# Patient Record
Sex: Female | Born: 1990 | Hispanic: No | Marital: Single | Smoking: Former smoker
Health system: Southern US, Community
[De-identification: ages and names within clinical notes are randomized; demographics above are authoritative.]

## PROBLEM LIST (undated history)

## (undated) ENCOUNTER — Inpatient Hospital Stay (HOSPITAL_COMMUNITY): Payer: Self-pay

## (undated) DIAGNOSIS — K219 Gastro-esophageal reflux disease without esophagitis: Secondary | ICD-10-CM

## (undated) DIAGNOSIS — I10 Essential (primary) hypertension: Secondary | ICD-10-CM

## (undated) DIAGNOSIS — A749 Chlamydial infection, unspecified: Secondary | ICD-10-CM

## (undated) DIAGNOSIS — O139 Gestational [pregnancy-induced] hypertension without significant proteinuria, unspecified trimester: Secondary | ICD-10-CM

## (undated) DIAGNOSIS — R51 Headache: Secondary | ICD-10-CM

## (undated) DIAGNOSIS — D6800 Von Willebrand disease, unspecified: Secondary | ICD-10-CM

## (undated) DIAGNOSIS — A549 Gonococcal infection, unspecified: Secondary | ICD-10-CM

## (undated) HISTORY — PX: DILATION AND CURETTAGE OF UTERUS: SHX78

## (undated) HISTORY — PX: WISDOM TOOTH EXTRACTION: SHX21

## (undated) HISTORY — PX: INDUCED ABORTION: SHX677

## (undated) HISTORY — DX: Gastro-esophageal reflux disease without esophagitis: K21.9

## (undated) HISTORY — PX: TONSILLECTOMY: SUR1361

## (undated) HISTORY — PX: CHOLECYSTECTOMY: SHX55

---
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
## 2011-07-26 DIAGNOSIS — N921 Excessive and frequent menstruation with irregular cycle: Secondary | ICD-10-CM | POA: Insufficient documentation

## 2011-07-28 ENCOUNTER — Inpatient Hospital Stay (HOSPITAL_COMMUNITY)
Admission: AD | Admit: 2011-07-28 | Discharge: 2011-07-28 | Disposition: A | Discharge status: Home / Self Care | Payer: Self-pay | Source: Ambulatory Visit | Attending: Obstetrics & Gynecology | Admitting: Obstetrics & Gynecology

## 2011-07-28 ENCOUNTER — Encounter (HOSPITAL_COMMUNITY): Payer: Self-pay | Admitting: *Deleted

## 2011-07-28 DIAGNOSIS — N938 Other specified abnormal uterine and vaginal bleeding: Secondary | ICD-10-CM | POA: Insufficient documentation

## 2011-07-28 DIAGNOSIS — N939 Abnormal uterine and vaginal bleeding, unspecified: Secondary | ICD-10-CM

## 2011-07-28 DIAGNOSIS — N898 Other specified noninflammatory disorders of vagina: Secondary | ICD-10-CM

## 2011-07-28 DIAGNOSIS — N949 Unspecified condition associated with female genital organs and menstrual cycle: Secondary | ICD-10-CM | POA: Insufficient documentation

## 2011-07-28 HISTORY — DX: Headache: R51

## 2011-07-28 HISTORY — DX: Gonococcal infection, unspecified: A54.9

## 2011-07-28 HISTORY — DX: Chlamydial infection, unspecified: A74.9

## 2011-07-28 LAB — CBC
Hemoglobin: 12.5 g/dL (ref 12.0–15.0)
MCH: 27.5 pg (ref 26.0–34.0)
MCHC: 34.2 g/dL (ref 30.0–36.0)
RDW: 12.6 % (ref 11.5–15.5)

## 2011-07-28 LAB — WET PREP, GENITAL

## 2011-07-28 MED ORDER — NORGESTIM-ETH ESTRAD TRIPHASIC 0.18/0.215/0.25 MG-25 MCG PO TABS: 1.0000 | ORAL_TABLET | Freq: Every day | ORAL | Status: DC

## 2011-07-28 NOTE — Progress Notes (Signed)
C/o "heavy bleeding" x2 wk.  Period started on time, but normally lasts 3-4 days.  Not using OC but is interested in receiving them.

## 2011-07-28 NOTE — Progress Notes (Signed)
PT SAYS   WAS SUPPOSE TO HAVE CYCLE  ON 06-06-2011- AS REG BUT IT CAME ON 6TH-.    SO NOV  3 RD SUPPOSE TO COME.   SO STARTED SPOTTING ON 07-17-2011-  MADE APPOINTMENT  WITH HD  ON 19TH TO FIND OUT IF PREG.   DID NOT GO.    HAD SEX ON 07-19-2011-  HAD LG AMT BLEEDING-  THEN THRU WEEK- HAD BLEEDING.  HAD BLOOD CLOTS  ON 11-24  AND STILL HAVING CLOTS-  FROM DIME- TO  HALF- DOLLAR SIZE.   HAS BACK PAIN X1 MTH.     NO BIRTH CONTROL.  HAS PAD ON NOW- IN TRIAGE- 2 RED STREAKS.

## 2011-07-28 NOTE — ED Provider Notes (Signed)
History     CSN: [id number] Arrival date & time: 07/28/2011  8:22 PM   None     Chief Complaint  Patient presents with  . Vaginal Bleeding    HPI Carly Rush is a 20 y.o. female who presents to [facility]U for vaginal bleeding. She states that in the past her periods have always been regular but October 8 th had period and then November 18 th had spotting. Started bleeding heavier on the 20 th. Has passed quarter size clots. Had appointment for pregnancy test on the 20 th but didn't go. Here tonight for pregnancy test and to see why bleeding. Last sexual intercourse 07/19/11. Never had a pap smear. TAB x 1. No birth control.  Past Medical History  Diagnosis Date  . Asthma   . Headache   . Gonorrhea   . Chlamydia     Past Surgical History  Procedure Date  . Tonsillectomy   . Induced abortion     History reviewed. No pertinent family history.  History  Substance Use Topics  . Smoking status: Current Some Day Smoker  . Smokeless tobacco: Never Used  . Alcohol Use: No     3-4 drinks occasionally    OB History    Grav Para Term Preterm Abortions TAB SAB Ect Mult Living   1    1 1     0      Review of Systems  Constitutional: Negative for fever, chills, diaphoresis and fatigue.  HENT: Negative for ear pain, congestion, sore throat, facial swelling, neck pain, neck stiffness, dental problem and sinus pressure.   Eyes: Negative for photophobia, pain and discharge.  Respiratory: Negative for cough, chest tightness and wheezing.   Gastrointestinal: Negative for nausea, vomiting, abdominal pain, diarrhea, constipation and abdominal distention.  Genitourinary: Positive for vaginal bleeding and vaginal discharge. Negative for dysuria, frequency, flank pain and difficulty urinating.  Musculoskeletal: Negative for myalgias, back pain and gait problem.  Skin: Negative for color change and rash.  Neurological: Negative for dizziness, speech difficulty, weakness, light-headedness, numbness  and headaches.  Psychiatric/Behavioral: Negative for confusion and agitation.    Allergies  Review of patient's allergies indicates no known allergies.  Home Medications  No current outpatient prescriptions on file.  BP 141/79  Pulse 97  Temp(Src) 98.8 F (37.1 C) (Oral)  Resp 20  Ht 5\' 3"  (1.6 m)  Wt 233 lb 2 oz (105.745 kg)  BMI 41.30 kg/m2  LMP 07/17/2011  Physical Exam  Nursing note and vitals reviewed. Constitutional: She is oriented to person, place, and time. She appears well-developed and well-nourished. No distress.  HENT:  Head: Normocephalic and atraumatic.  Eyes: EOM are normal.  Neck: Neck supple.  Cardiovascular: Normal rate.   Pulmonary/Chest: Effort normal.  Abdominal: Soft. There is no tenderness.  Genitourinary:       External genitalia without lesions. Moderate blood vaginal vault. No CMT, no adnexal tenderness or mass palpated. Uterus without palpable enlargement.  Musculoskeletal: Normal range of motion.  Neurological: She is alert and oriented to person, place, and time. No cranial nerve deficit.  Skin: Skin is warm and dry.  Psychiatric: Her behavior is normal. Judgment and thought content normal.   Results for orders placed during the hospital encounter of 07/28/11 (from the past 24 hour(s))  POCT PREGNANCY, URINE     Status: Normal   Collection Time   07/28/11  9:55 PM      Component Value Range   Preg Test, Ur NEGATIVE  WET PREP, GENITAL     Status: Abnormal   Collection Time   07/28/11 10:10 PM      Component Value Range   Yeast, Wet Prep NONE SEEN  NONE SEEN    Trich, Wet Prep NONE SEEN  NONE SEEN    Clue Cells, Wet Prep FEW (*) NONE SEEN    WBC, Wet Prep HPF POC FEW (*) NONE SEEN   CBC     Status: Abnormal   Collection Time   07/28/11 10:20 PM      Component Value Range   WBC 11.6 (*) 4.0 - 10.5 (K/uL)   RBC 4.54  3.87 - 5.11 (MIL/uL)   Hemoglobin 12.5  12.0 - 15.0 (g/dL)   HCT 64.3  32.9 - 51.8 (%)   MCV 80.4  78.0 - 100.0  (fL)   MCH 27.5  26.0 - 34.0 (pg)   MCHC 34.2  30.0 - 36.0 (g/dL)   RDW 84.1  66.0 - 63.0 (%)   Platelets 264  150 - 400 (K/uL)   Assessment: Abnormal vaginal bleeding  Plan:  Discussed results of lab with patient   Discussed in detail abnormal vaginal bleeding causes and need for follow up   OC's to regulate bleeding until follow up with [facility]  ED Course  Procedures          [place], NP 07/29/11 [phone]

## 2011-07-29 LAB — GC/CHLAMYDIA PROBE AMP, GENITAL: GC Probe Amp, Genital: NEGATIVE

## 2011-07-29 NOTE — ED Provider Notes (Signed)
Agree with above note.  Don Tiu H. 07/29/2011 6:52 AM

## 2011-10-17 ENCOUNTER — Inpatient Hospital Stay (HOSPITAL_COMMUNITY)
Admission: AD | Admit: 2011-10-17 | Discharge: 2011-10-17 | Disposition: A | Discharge status: Home / Self Care | Payer: Self-pay | Source: Ambulatory Visit | Attending: Family Medicine | Admitting: Family Medicine

## 2011-10-17 ENCOUNTER — Inpatient Hospital Stay (HOSPITAL_COMMUNITY): Payer: Self-pay

## 2011-10-17 ENCOUNTER — Encounter (HOSPITAL_COMMUNITY): Payer: Self-pay

## 2011-10-17 DIAGNOSIS — N949 Unspecified condition associated with female genital organs and menstrual cycle: Secondary | ICD-10-CM

## 2011-10-17 DIAGNOSIS — A562 Chlamydial infection of genitourinary tract, unspecified: Secondary | ICD-10-CM | POA: Insufficient documentation

## 2011-10-17 DIAGNOSIS — N920 Excessive and frequent menstruation with regular cycle: Secondary | ICD-10-CM | POA: Insufficient documentation

## 2011-10-17 LAB — CBC
Hemoglobin: 7.5 g/dL — ABNORMAL LOW (ref 12.0–15.0)
MCV: 80.5 fL (ref 78.0–100.0)
Platelets: 336 10*3/uL (ref 150–400)
RBC: 2.92 MIL/uL — ABNORMAL LOW (ref 3.87–5.11)
WBC: 9.3 10*3/uL (ref 4.0–10.5)

## 2011-10-17 LAB — WET PREP, GENITAL
Clue Cells Wet Prep HPF POC: NONE SEEN
WBC, Wet Prep HPF POC: NONE SEEN
Yeast Wet Prep HPF POC: NONE SEEN

## 2011-10-17 MED ORDER — MEDROXYPROGESTERONE ACETATE 10 MG PO TABS: 10.0000 mg | ORAL_TABLET | Freq: Two times a day (BID) | ORAL | Status: DC

## 2011-10-17 MED ORDER — INTEGRA F 125-1 MG PO CAPS: 1.0000 | ORAL_CAPSULE | Freq: Every day | ORAL | Status: DC

## 2011-10-17 NOTE — Discharge Instructions (Signed)
Menorrhagia Dysfunctional uterine bleeding is different from a normal menstrual period. When periods are heavy or there is more bleeding than is usual for you, it is called menorrhagia. It may be caused by hormonal imbalance, or physical, metabolic, or other problems. Examination is necessary in order that your caregiver may treat treatable causes. If this is a continuing problem, a D&C may be needed. That means that the cervix (the opening of the uterus or womb) is dilated (stretched larger) and the lining of the uterus is scraped out. The tissue scraped out is then examined under a microscope by a specialist (pathologist) to make sure there is nothing of concern that needs further or more extensive treatment. HOME CARE INSTRUCTIONS   If medications were prescribed, take exactly as directed. Do not change or switch medications without consulting your caregiver.   Long term heavy bleeding may result in iron deficiency. Your caregiver may have prescribed iron pills. They help replace the iron your body lost from heavy bleeding. Take exactly as directed. Iron may cause constipation. If this becomes a problem, increase the bran, fruits, and roughage in your diet.   Do not take aspirin or medicines that contain aspirin one week before or during your menstrual period. Aspirin may make the bleeding worse.   If you need to change your sanitary pad or tampon more than once every 2 hours, stay in bed and rest as much as possible until the bleeding stops.   Eat well-balanced meals. Eat foods high in iron. Examples are leafy green vegetables, meat, liver, eggs, and whole grain breads and cereals. Do not try to lose weight until the abnormal bleeding has stopped and your blood iron level is back to normal.  SEEK MEDICAL CARE IF:   You need to change your sanitary pad or tampon more than once an hour.   You develop nausea (feeling sick to your stomach) and vomiting, dizziness, or diarrhea while you are taking  your medicine.   You have any problems that may be related to the medicine you are taking.  SEEK IMMEDIATE MEDICAL CARE IF:   You have a fever.   You develop chills.   You develop severe bleeding or start to pass blood clots.   You feel dizzy or faint.  MAKE SURE YOU:   Understand these instructions.   Will watch your condition.   Will get help right away if you are not doing well or get worse.  Document Released: 08/15/2005 Document Revised: 04/27/2011 Document Reviewed: 04/04/2008 [facility] Patient Information 2012 [place], [place].  Von Willebrand Disease, FAQs IS THERE A CURE FOR VON WILLEBRAND DISEASE? No. Von Willebrand disease is a lifelong disorder. Most people have a mild form of the disorder that causes little or no change in their lives. WHO SHOULD KNOW THAT I HAVE VON WILLEBRAND DISEASE?  Adults should be sure that persons such as their doctor, dentist, employee health nurse, gym trainer, and sports coach are aware of their disorder. You should wear a medical ID bracelet stating that you have von Willebrand disease. In case of an accident or emergency, this will be very helpful to the caregivers treating you.  WHO SHOULD KNOW THAT MY CHILD HAS VON WILLEBRAND DISEASE?  It is important that anyone who will be responsible for your child know about his or her disorder. For example, the school nurse, teacher, daycare provider, coach, or any leader of any after-school activity should know that your son or daughter has von Willebrand disease.  WHY IS  THIS CONDITION CALLED VON WILLEBRAND DISEASE? A man named Orson Ape Willebrand discovered the disease in 1925. The disease was named after him.  IS VON WILLEBRAND DISEASE LIFE-THREATENING?  Most people with von Willebrand disease have a mild form that usually does not cause bleeding that is life threatening. Any bleeding that cannot be controlled can be life threatening. People with severe forms of von Willebrand disease will rarely  need to seek immediate emergency treatment to stop life threatening bleeding. DO ALL PEOPLE WHO HAVE THE GENE FOR VON WILLEBRAND DISEASE HAVE BLEEDING PROBLEMS? No. Many people carry the genes for the disease but do not have bleeding symptoms. Even though they do not have symptoms, they can still pass the disease on to their children.  HOW WILL I KNOW IF I HAVE VON WILLEBRAND DISEASE?  If you or anyone in your family has a history of bleeding, your doctor will use a number of blood tests to find out whether or not you have the disease. These tests will check how quickly your blood clots and the levels of clotting factors in your blood. Your caregiver will use these test results to diagnose your bleeding disorder. Document Released: 04/12/2005 Document Revised: 04/27/2011 Document Reviewed: 11/23/2005 [facility] Patient Information 2012 [place], [place].

## 2011-10-17 NOTE — ED Provider Notes (Signed)
History     Chief Complaint  Patient presents with  . Vaginal Bleeding   HPI  Pt here with report of irregular bleeding that started in November 2012.  Pt bled in November for 3-4 weeks.  No bleeding x 1.5 wks and bleeding returned.  Uses 1.5 pack of pads every 3-4 days.  Reports frequent headaches and fatigue.  Denies chest pain, occasional shortness of breath.  +clots, quarter-size.    Pt has Von Willebrand disease, diagnosed during childhood.  Past Medical History  Diagnosis Date  . Asthma   . Headache   . Gonorrhea 2011  . Chlamydia 2011    Past Surgical History  Procedure Date  . Tonsillectomy   . Induced abortion     Family History  Problem Relation Age of Onset  . Anesthesia problems Neg Hx     History  Substance Use Topics  . Smoking status: Current Some Day Smoker  . Smokeless tobacco: Never Used  . Alcohol Use: No     3-4 drinks occasionally    Allergies: No Known Allergies  Prescriptions prior to admission  Medication Sig Dispense Refill  . ibuprofen (ADVIL,MOTRIN) 200 MG tablet Take 200 mg by mouth every 6 (six) hours as needed. For pain         Review of Systems  Constitutional: Positive for malaise/fatigue.  Eyes: Negative.   Respiratory: Positive for shortness of breath.   Cardiovascular: Negative.   Gastrointestinal: Negative.   Neurological: Positive for dizziness ("occasional") and headaches.   Physical Exam   Blood pressure 121/70, pulse 99, temperature 98.1 F (36.7 C), temperature source Oral, resp. rate 20, height 5\' 4"  (1.626 m), weight 100.245 kg (221 lb), last menstrual period 09/13/2011, SpO2 100.00%.  Physical Exam  Constitutional: She is oriented to person, place, and time. She appears well-developed and well-nourished.  HENT:  Head: Normocephalic.  Neck: Normal range of motion. Neck supple.  Cardiovascular: Normal rate, regular rhythm and normal heart sounds.   Respiratory: Effort normal and breath sounds normal.  GI:  Soft. She exhibits no mass. There is tenderness. There is no guarding.  Genitourinary: There is bleeding (negative clots, moderate) around the vagina.       Uterus difficult to palpate secondary to weight  Neurological: She is alert and oriented to person, place, and time. She has normal reflexes.  Skin: Skin is warm and dry.   Results for orders placed during the hospital encounter of 10/17/11 (from the past 24 hour(s))  POCT PREGNANCY, URINE     Status: Normal   Collection Time   10/17/11  5:37 PM      Component Value Range   Preg Test, Ur NEGATIVE  NEGATIVE   CBC     Status: Abnormal   Collection Time   10/17/11  5:50 PM      Component Value Range   WBC 9.3  4.0 - 10.5 (K/uL)   RBC 2.92 (*) 3.87 - 5.11 (MIL/uL)   Hemoglobin 7.5 (*) 12.0 - 15.0 (g/dL)   HCT 09.8 (*) 11.9 - 46.0 (%)   MCV 80.5  78.0 - 100.0 (fL)   MCH 25.7 (*) 26.0 - 34.0 (pg)   MCHC 31.9  30.0 - 36.0 (g/dL)   RDW 14.7  82.9 - 56.2 (%)   Platelets 336  150 - 400 (K/uL)  WET PREP, GENITAL     Status: Normal   Collection Time   10/17/11  6:51 PM      Component Value Range  Yeast Wet Prep HPF POC NONE SEEN  NONE SEEN    Trich, Wet Prep NONE SEEN  NONE SEEN    Clue Cells Wet Prep HPF POC NONE SEEN  NONE SEEN    WBC, Wet Prep HPF POC NONE SEEN  NONE SEEN    Imaging:  US Transvaginal Non-ob  10/17/2011  *RADIOLOGY REPORT*  Clinical Data: Dysfunctional uterine bleeding.  TRANSABDOMINAL AND TRANSVAGINAL ULTRASOUND OF PELVIS Technique:  Both transabdominal and transvaginal ultrasound examinations of the pelvis were performed. Transabdominal technique was performed for global imaging of the pelvis including uterus, ovaries, adnexal regions, and pelvic cul-de-sac.  Comparison: None.   It was necessary to proceed with endovaginal exam following the transabdominal exam to visualize the uterus, endometrium and ovaries and better detail.  Findings:  Uterus: Normal in size and appearance  Endometrium: Normal in thickness and  appearance, measuring 11.0 mm in maximum thickness.  No visible mass.  Right ovary:  Normal appearance/no adnexal mass  Left ovary: Normal appearance/no adnexal mass  Other findings: No free fluid  IMPRESSION: Normal study. No evidence of pelvic mass or other significant abnormality.  Original Report Authenticated By: Darrol Angel, M.D.   US Pelvis Complete  10/17/2011  *RADIOLOGY REPORT*  Clinical Data: Dysfunctional uterine bleeding.  TRANSABDOMINAL AND TRANSVAGINAL ULTRASOUND OF PELVIS Technique:  Both transabdominal and transvaginal ultrasound examinations of the pelvis were performed. Transabdominal technique was performed for global imaging of the pelvis including uterus, ovaries, adnexal regions, and pelvic cul-de-sac.  Comparison: None.   It was necessary to proceed with endovaginal exam following the transabdominal exam to visualize the uterus, endometrium and ovaries and better detail.  Findings:  Uterus: Normal in size and appearance  Endometrium: Normal in thickness and appearance, measuring 11.0 mm in maximum thickness.  No visible mass.  Right ovary:  Normal appearance/no adnexal mass  Left ovary: Normal appearance/no adnexal mass  Other findings: No free fluid  IMPRESSION: Normal study. No evidence of pelvic mass or other significant abnormality.  Original Report Authenticated By: Darrol Angel, M.D.   MAU Course  Procedures Pelvic exam with wet prep, GC/Chlamydia U/S  Discussed results with Dr Shawnie Pons.  Plan to d/c home with iron supplement, Provera for 10 days, and f/u in Gyn clinic.    Assessment and Plan  Report given to L. L-Kirby, who assumes care of patient.   [facility] 10/17/2011, 6:17 PM   A: Menorrhagia  P: D/C home Provera 10 mg BID until bleeding stops, then 1x day for total of 10 days Integra iron supplement daily F/u in Gyn clinic  Sharen Counter, CNM

## 2011-10-17 NOTE — Progress Notes (Signed)
Nov, period was late - when started bleeding- bled for 3 wks, heavy with clots, was here end of Nov, Dx dysfunctional bleeding, stopped bleeding for a few days then restarted end of Jan, bleeding since.  Daily headaches and sometimes feels dizzy.  Gets sort of breath easy.

## 2011-10-18 LAB — GC/CHLAMYDIA PROBE AMP, GENITAL: GC Probe Amp, Genital: NEGATIVE

## 2011-10-18 NOTE — ED Provider Notes (Signed)
Chart reviewed and agree with management and plan.  

## 2011-11-02 ENCOUNTER — Ambulatory Visit (INDEPENDENT_AMBULATORY_CARE_PROVIDER_SITE_OTHER): Payer: Self-pay | Admitting: Advanced Practice Midwife

## 2011-11-02 ENCOUNTER — Encounter: Payer: Self-pay | Admitting: Advanced Practice Midwife

## 2011-11-02 DIAGNOSIS — R350 Frequency of micturition: Secondary | ICD-10-CM

## 2011-11-02 DIAGNOSIS — D6800 Von Willebrand disease, unspecified: Secondary | ICD-10-CM

## 2011-11-02 DIAGNOSIS — A749 Chlamydial infection, unspecified: Secondary | ICD-10-CM

## 2011-11-02 DIAGNOSIS — A64 Unspecified sexually transmitted disease: Secondary | ICD-10-CM

## 2011-11-02 DIAGNOSIS — N921 Excessive and frequent menstruation with irregular cycle: Secondary | ICD-10-CM

## 2011-11-02 DIAGNOSIS — N920 Excessive and frequent menstruation with regular cycle: Secondary | ICD-10-CM

## 2011-11-02 DIAGNOSIS — A562 Chlamydial infection of genitourinary tract, unspecified: Secondary | ICD-10-CM

## 2011-11-02 LAB — CBC
HCT: 34.2 % — ABNORMAL LOW (ref 36.0–46.0)
Hemoglobin: 10.4 g/dL — ABNORMAL LOW (ref 12.0–15.0)
MCH: 25.4 pg — ABNORMAL LOW (ref 26.0–34.0)
MCHC: 30.4 g/dL (ref 30.0–36.0)

## 2011-11-02 MED ORDER — NORGESTIMATE-ETH ESTRADIOL 0.25-35 MG-MCG PO TABS: 1.0000 | ORAL_TABLET | Freq: Every day | ORAL | Status: DC

## 2011-11-02 MED ORDER — INTEGRA F 125-1 MG PO CAPS: 1.0000 | ORAL_CAPSULE | Freq: Every day | ORAL | Status: DC

## 2011-11-02 MED ORDER — MEDROXYPROGESTERONE ACETATE 10 MG PO TABS: 10.0000 mg | ORAL_TABLET | Freq: Two times a day (BID) | ORAL | Status: DC

## 2011-11-02 NOTE — Progress Notes (Signed)
  Subjective:    Patient ID: Carly Rush, female    DOB: 1991-04-25, 21 y.o.   MRN: [id number]  HPI Here for F/U visit from MAU for menometrorrhagia w/ Von Willebrand's. She took Provera w/ good results, but bleeding started back on 10/29/11 and has been heavy again although not as much as last episode. She has been taking Integra F. She was Dx w/ Chlamydia at last MAU visit and Tx . Partner also Tx. TOC today. Reports frequency.  Review of Systems  Constitutional: Negative for fatigue.  Genitourinary: Positive for frequency and vaginal bleeding. Negative for dysuria, flank pain and pelvic pain.  Skin: Negative for pallor.  Neurological: Negative for dizziness and syncope.  Hematological: Does not bruise/bleed easily.       Objective:   Physical Exam  Constitutional: She is oriented to person, place, and time. She appears well-developed and well-nourished. No distress.  Cardiovascular: Normal rate.   Pulmonary/Chest: Effort normal.  Abdominal: Soft. There is no tenderness.  Genitourinary:       Deferred  Neurological: She is alert and oriented to person, place, and time.  Skin: Skin is warm and dry. No pallor.  Psychiatric: She has a normal mood and affect.      Assessment & Plan:    1. STD (female)  GC/chlamydia probe amp, urine  2. Menometrorrhagia  CBC, Fe Fum-FePoly-FA-Vit C-Vit B3 (INTEGRA F) 125-1 MG CAPS, medroxyPROGESTERone (PROVERA) 10 MG tablet, norgestimate-ethinyl estradiol (ORTHO-CYCLEN,SPRINTEC,PREVIFEM) 0.25-35 MG-MCG tablet  3. Frequency of urination  UA, Urine Culture   Pt prefers to do same regimen of Provera as last MAU visit (10 mg BID). Instructed to immediately start OCPs next day Bleeding precautions  Dorathy Kinsman 11/02/2011 2:46 PM

## 2011-11-02 NOTE — Patient Instructions (Signed)
Menorrhagia Dysfunctional uterine bleeding is different from a normal menstrual period. When periods are heavy or there is more bleeding than is usual for you, it is called menorrhagia. It may be caused by hormonal imbalance, or physical, metabolic, or other problems. Examination is necessary in order that your caregiver may treat treatable causes. If this is a continuing problem, a D&C may be needed. That means that the cervix (the opening of the uterus or womb) is dilated (stretched larger) and the lining of the uterus is scraped out. The tissue scraped out is then examined under a microscope by a specialist (pathologist) to make sure there is nothing of concern that needs further or more extensive treatment. HOME CARE INSTRUCTIONS   If medications were prescribed, take exactly as directed. Do not change or switch medications without consulting your caregiver.   Long term heavy bleeding may result in iron deficiency. Your caregiver may have prescribed iron pills. They help replace the iron your body lost from heavy bleeding. Take exactly as directed. Iron may cause constipation. If this becomes a problem, increase the bran, fruits, and roughage in your diet.   Do not take aspirin or medicines that contain aspirin one week before or during your menstrual period. Aspirin may make the bleeding worse.   If you need to change your sanitary pad or tampon more than once every 2 hours, stay in bed and rest as much as possible until the bleeding stops.   Eat well-balanced meals. Eat foods high in iron. Examples are leafy green vegetables, meat, liver, eggs, and whole grain breads and cereals. Do not try to lose weight until the abnormal bleeding has stopped and your blood iron level is back to normal.  SEEK MEDICAL CARE IF:   You need to change your sanitary pad or tampon more than once an hour.   You develop nausea (feeling sick to your stomach) and vomiting, dizziness, or diarrhea while you are taking  your medicine.   You have any problems that may be related to the medicine you are taking.  SEEK IMMEDIATE MEDICAL CARE IF:   You have a fever.   You develop chills.   You develop severe bleeding or start to pass blood clots.   You feel dizzy or faint.  MAKE SURE YOU:   Understand these instructions.   Will watch your condition.   Will get help right away if you are not doing well or get worse.  Document Released: 08/15/2005 Document Revised: 08/04/2011 Document Reviewed: 04/04/2008 [facility] Patient Information 2012 [place], [place].Von Willebrand Disease, FAQs IS THERE A CURE FOR VON WILLEBRAND DISEASE? No. Von Willebrand disease is a lifelong disorder. Most people have a mild form of the disorder that causes little or no change in their lives. WHO SHOULD KNOW THAT I HAVE VON WILLEBRAND DISEASE?  Adults should be sure that persons such as their doctor, dentist, employee health nurse, gym trainer, and sports coach are aware of their disorder. You should wear a medical ID bracelet stating that you have von Willebrand disease. In case of an accident or emergency, this will be very helpful to the caregivers treating you.  WHO SHOULD KNOW THAT MY CHILD HAS VON WILLEBRAND DISEASE?  It is important that anyone who will be responsible for your child know about his or her disorder. For example, the school nurse, teacher, daycare provider, coach, or any leader of any after-school activity should know that your son or daughter has von Willebrand disease.  WHY IS THIS CONDITION  CALLED VON WILLEBRAND DISEASE? A man named Orson Ape Willebrand discovered the disease in 1925. The disease was named after him.  IS VON WILLEBRAND DISEASE LIFE-THREATENING?  Most people with von Willebrand disease have a mild form that usually does not cause bleeding that is life threatening. Any bleeding that cannot be controlled can be life threatening. People with severe forms of von Willebrand disease will rarely need to  seek immediate emergency treatment to stop life threatening bleeding. DO ALL PEOPLE WHO HAVE THE GENE FOR VON WILLEBRAND DISEASE HAVE BLEEDING PROBLEMS? No. Many people carry the genes for the disease but do not have bleeding symptoms. Even though they do not have symptoms, they can still pass the disease on to their children.  HOW WILL I KNOW IF I HAVE VON WILLEBRAND DISEASE?  If you or anyone in your family has a history of bleeding, your doctor will use a number of blood tests to find out whether or not you have the disease. These tests will check how quickly your blood clots and the levels of clotting factors in your blood. Your caregiver will use these test results to diagnose your bleeding disorder. Document Released: 04/12/2005 Document Revised: 08/04/2011 Document Reviewed: 11/23/2005 [facility] Patient Information 2012 [place], [place].

## 2011-11-03 LAB — GC/CHLAMYDIA PROBE AMP, URINE: Chlamydia, Swab/Urine, PCR: NEGATIVE

## 2011-11-15 ENCOUNTER — Telehealth: Payer: Self-pay | Admitting: *Deleted

## 2011-11-15 NOTE — Telephone Encounter (Signed)
Pt left message stating that she would like to speak to a nurse.  

## 2011-11-15 NOTE — Telephone Encounter (Signed)
Returned pt call and discussed her concern. She stated that she took the 5 days of provera after her last visit and even though her bleeding had not stopped, she began her birth control pills on 11/07/11. She is still bleeding- using 1 pad every 5 hrs and wants to know if this is ok. I reviewed the provera order and told pt that I thought she was supposed to take the medication for 10 days prior to the start of birth control. Pt states that she did not understand that. I informed her that while the bleeding may be annoying or disruptive, it is not heavy enough to cause her immediate problems. I advised pt to continue taking the birth control pills as prescribed.  I will talk with provider and call her back with additional advice/information. Pt voiced understanding.

## 2011-11-21 NOTE — Telephone Encounter (Signed)
Her appt is Thursday April 11 at 2:15pm with Dorathy Kinsman.

## 2011-11-21 NOTE — Telephone Encounter (Signed)
Called pt and informed her of next appt on 12/08/11 @ 1415. Pt voiced understanding.

## 2011-11-21 NOTE — Telephone Encounter (Signed)
Spoke w/pt today after consult w/Dr. Penne Lash. I asked if her bleeding is the same. She stated that it has tapered down but she is still spotting. I informed her that she should take 1 OCP twice daily for the next week, then resume 1 pill daily if her bleeding has stopped. She should not take the last week of pills for this pack or the next pack. We will make her an appt for follow up next month and I will call her back with appt details. Pt stated that she has noticed some changes in her mood and wanted to know if she could get a lower dose pill. I advised her that we would like to get her bleeding controlled first, then she may be able to switch to a different pill. This can de discussed @ her follow up appt. Pt voiced understanding.

## 2011-12-08 ENCOUNTER — Ambulatory Visit: Payer: Self-pay | Admitting: Advanced Practice Midwife

## 2012-08-29 NOTE — L&D Delivery Note (Signed)
Delivery Note At 3:33 PM a viable female was delivered via  (Presentation: LOA ;  ).  APGAR: 9 ,9 ; weight .   Placenta status: , .  Cord:  with the following complications:   Anesthesia:  None Episiotomy: None Lacerations:  None Suture Repair: NA Est. Blood Loss (mL): 300  Mom to postpartum.  Baby to nursery-stable.  NSVD over intact perineum of viable female. Active management of 3rd stage of labor and prophylactic cytotec PR at time of delivery of placenta. Intact 3V. Will give DDAVP 0.3 mcg/kg x1 IV and then 2 sprays nasal q12 hrs for 6 doses. EBL 300. Hemostatic when i left the room.  Tawana Scale 05/29/2013, 3:52 PM

## 2012-08-29 NOTE — L&D Delivery Note (Signed)
Attestation of Attending Supervision of Fellow: Evaluation and management procedures were performed by the Fellow under my supervision and collaboration.  I have reviewed the Fellow's note and chart, and I agree with the management and plan.    

## 2012-10-01 ENCOUNTER — Inpatient Hospital Stay (HOSPITAL_COMMUNITY)
Admission: AD | Admit: 2012-10-01 | Discharge: 2012-10-01 | Disposition: A | Discharge status: Home / Self Care | Payer: Medicaid Other | Source: Ambulatory Visit | Attending: Obstetrics & Gynecology | Admitting: Obstetrics & Gynecology

## 2012-10-01 ENCOUNTER — Encounter (HOSPITAL_COMMUNITY): Payer: Self-pay

## 2012-10-01 DIAGNOSIS — A6000 Herpesviral infection of urogenital system, unspecified: Secondary | ICD-10-CM

## 2012-10-01 DIAGNOSIS — D6800 Von Willebrand disease, unspecified: Secondary | ICD-10-CM

## 2012-10-01 DIAGNOSIS — Z3201 Encounter for pregnancy test, result positive: Secondary | ICD-10-CM

## 2012-10-01 DIAGNOSIS — Z3490 Encounter for supervision of normal pregnancy, unspecified, unspecified trimester: Secondary | ICD-10-CM

## 2012-10-01 NOTE — MAU Provider Note (Signed)
Attestation of Attending Supervision of Advanced Practitioner (PA/CNM/NP): Evaluation and management procedures were performed by the Advanced Practitioner under my supervision and collaboration.  I have reviewed the Advanced Practitioner's note and chart, and I agree with the management and plan.  Jaylianna Tatlock, MD, FACOG Attending Obstetrician & Gynecologist Faculty Practice, [facility]  

## 2012-10-01 NOTE — MAU Note (Signed)
Patient states she has had 3 positive home pregnancy tests. Wants confirmation. Denies pain or bleeding.

## 2012-10-01 NOTE — MAU Provider Note (Signed)
CSN: [id number]  Arrival date and time: 10/01/12 1411   None     Chief Complaint  Patient presents with  . Possible Pregnancy   HPI Carly Rush is a 22 y.o. female @ [redacted] weeks gestation.  Patient's last menstrual period was 09/02/2012.  She had a positive home pregnancy test and wants to get into prenatal care as soon as possible because she has [employer] and was told she could have problems during pregnancy. 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 She denies any problems at this time. The history was provided by the patient.  OB History    Grav Para Term Preterm Abortions TAB SAB Ect Mult Living   1 0 0 0 1 1 0 0 0 0       Past Medical History  Diagnosis Date  . Asthma   . Headache   . Gonorrhea 2011  . Chlamydia 2011    Past Surgical History  Procedure Date  . Tonsillectomy   . Induced abortion     Family History  Problem Relation Age of Onset  . Anesthesia problems Neg Hx     History  Substance Use Topics  . Smoking status: Current Some Day Smoker  . Smokeless tobacco: Never Used  . Alcohol Use: No     Comment: 3-4 drinks occasionally    Allergies: No Known Allergies  Prescriptions prior to admission  Medication Sig Dispense Refill  . Fe Fum-FePoly-FA-Vit C-Vit B3 (INTEGRA F) 125-1 MG CAPS Take 1 capsule by mouth daily.  30 capsule  12  . ibuprofen (ADVIL,MOTRIN) 200 MG tablet Take 200 mg by mouth every 6 (six) hours as needed. For pain       . medroxyPROGESTERone (PROVERA) 10 MG tablet Take 1 tablet (10 mg total) by mouth 2 (two) times daily.  10 tablet  1  . norgestimate-ethinyl estradiol (ORTHO-CYCLEN,SPRINTEC,PREVIFEM) 0.25-35 MG-MCG tablet Take 1 tablet by mouth daily.  1 Package  12    Review of Systems  Genitourinary:       Positive pregnancy test, amenorrhea  All other systems reviewed and are negative.   Blood pressure 128/65, pulse 88, temperature 97.9 F (36.6 C),  temperature source Oral, resp. rate 18, height 5\' 4"  (1.626 m), weight 228 lb 12.8 oz (103.783 kg), last menstrual period 09/02/2012, SpO2 100.00%.  Physical Exam  Constitutional: She is oriented to person, place, and time. She appears well-developed and well-nourished. No distress.  Eyes: EOM are normal.  Neck: Neck supple.  Cardiovascular: Normal rate.   Respiratory: Effort normal. No respiratory distress.  Neurological: She is alert and oriented to person, place, and time.  Psychiatric: She has a normal mood and affect. Her behavior is normal. Judgment and thought content normal.   Procedures Results for orders placed during the hospital encounter of 10/01/12 (from the past 24 hour(s))  POCT PREGNANCY, URINE     Status: Abnormal   Collection Time   10/01/12  3:03 PM      Component Value Range   Preg Test, Ur POSITIVE (*) NEGATIVE    Assessment: 22 y.o. female with positive pregnancy test   [employer]   Plan:  Appointment with [facility] 11/15/12 @ 9:30 am   Pregnancy verification letter   Start prenatal vitamins, return as needed for problems.   Truong Delcastillo, RN, FNP, [facility] 10/01/2012, 3:31 PM   15:40 discussed with Dr. Macon Large and she agrees with assessment and plan  of care.

## 2012-10-05 ENCOUNTER — Inpatient Hospital Stay (HOSPITAL_COMMUNITY)
Admission: AD | Admit: 2012-10-05 | Discharge: 2012-10-05 | Disposition: A | Discharge status: Home / Self Care | Payer: Medicaid Other | Source: Ambulatory Visit | Attending: Obstetrics & Gynecology | Admitting: Obstetrics & Gynecology

## 2012-10-05 ENCOUNTER — Encounter (HOSPITAL_COMMUNITY): Payer: Self-pay | Admitting: *Deleted

## 2012-10-05 DIAGNOSIS — J069 Acute upper respiratory infection, unspecified: Secondary | ICD-10-CM

## 2012-10-05 DIAGNOSIS — O99891 Other specified diseases and conditions complicating pregnancy: Secondary | ICD-10-CM | POA: Insufficient documentation

## 2012-10-05 HISTORY — DX: Von Willebrand's disease: D68.0

## 2012-10-05 HISTORY — DX: Von Willebrand disease, unspecified: D68.00

## 2012-10-05 LAB — INFLUENZA PANEL BY PCR (TYPE A & B)
Influenza A By PCR: NEGATIVE
Influenza B By PCR: NEGATIVE

## 2012-10-05 NOTE — MAU Note (Addendum)
Been sick for like a week.  Runny nose, cough, sore throat when wakes up, denies at this time.  Boyfriend says her body  gets really hot at night- has not actually checked temperature(denies chills or diaphoresis) . Sometimes cough is productive- clear mucous

## 2012-10-05 NOTE — MAU Provider Note (Signed)
History     CSN: [id number]  Arrival date and time: 10/05/12 0865   First Provider Initiated Contact with Patient 10/05/12 [id number]      Chief Complaint  Patient presents with  . URI   HPI 22 y.o. G2P0010 at [redacted]w[redacted]d, "sick" x 4 days. C/O runny nose, occasional cough, sometimes productive of clear mucous, sore throat in the morning only, states she has not checked her temp at home, but her boyfriend says "her body gets hot in the middle of the night", does not feel hot any other time, denies chills, has not taken any meds for fever or cold symptoms.    Past Medical History  Diagnosis Date  . Asthma   . Headache   . Gonorrhea 2011  . Chlamydia 2011    Past Surgical History  Procedure Date  . Tonsillectomy   . Induced abortion     Family History  Problem Relation Age of Onset  . Anesthesia problems Neg Hx     History  Substance Use Topics  . Smoking status: Former Games developer  . Smokeless tobacco: Never Used  . Alcohol Use: No     Comment: 3-4 drinks occasionally    Allergies: No Known Allergies  Prescriptions prior to admission  Medication Sig Dispense Refill  . Prenatal Vit-Fe Fumarate-FA (PRENATAL MULTIVITAMIN) TABS Take 1 tablet by mouth daily.        Review of Systems  Constitutional: Positive for fever. Negative for chills and malaise/fatigue.  HENT: Positive for congestion and sore throat.   Respiratory: Positive for cough and sputum production. Negative for shortness of breath and wheezing.   Cardiovascular: Negative.   Gastrointestinal: Negative for nausea, vomiting, abdominal pain, diarrhea and constipation.  Genitourinary: Negative for dysuria, urgency, frequency, hematuria and flank pain.       Negative for vaginal bleeding, vaginal discharge, dyspareunia  Musculoskeletal: Negative.   Neurological: Negative.   Psychiatric/Behavioral: Negative.    Physical Exam   Blood pressure 128/89, pulse 98, temperature 98.6 F (37 C), temperature source Oral, resp.  rate 20, height 5' 3.5" (1.613 m), weight 231 lb (104.781 kg), last menstrual period 09/02/2012, SpO2 99.00%.  Physical Exam  Nursing note and vitals reviewed. Constitutional: She is oriented to person, place, and time. She appears well-developed and well-nourished. No distress.  HENT:  Head: Normocephalic and atraumatic.  Right Ear: Tympanic membrane, external ear and ear canal normal.  Left Ear: Tympanic membrane, external ear and ear canal normal.  Nose: Nose normal.  Mouth/Throat: Oropharynx is clear and moist. No oropharyngeal exudate, posterior oropharyngeal edema or posterior oropharyngeal erythema.  Eyes: Right eye exhibits no discharge. Left eye exhibits no discharge.  Neck: Normal range of motion. Neck supple.  Cardiovascular: Normal rate, regular rhythm and normal heart sounds.   Respiratory: Effort normal and breath sounds normal. No respiratory distress. She has no wheezes. She has no rales.  GI: Soft. There is no tenderness.  Musculoskeletal: Normal range of motion.  Lymphadenopathy:    She has no cervical adenopathy.  Neurological: She is alert and oriented to person, place, and time.  Skin: Skin is warm and dry.  Psychiatric: She has a normal mood and affect.    MAU Course  Procedures  Results for orders placed during the hospital encounter of 10/05/12 (from the past 24 hour(s))  INFLUENZA PANEL BY PCR     Status: Normal   Collection Time   10/05/12 10:03 AM      Component Value Range   Influenza A By  PCR NEGATIVE  NEGATIVE   Influenza B By PCR NEGATIVE  NEGATIVE   H1N1 flu by pcr NOT DETECTED  NOT DETECTED     Assessment and Plan   1. URI (upper respiratory infection)   Rev'd safe OTC cold meds in pregnancy, if last longer than 10 days, f/u at Urgent Care    Medication List     As of 10/05/2012  1:14 PM    CONTINUE taking these medications         prenatal multivitamin Tabs            Follow-up Information    Follow up with [facility]. (as scheduled)    Contact information:   [address] [place] [address] [phone]           Oletta Buehring 10/05/2012, 9:58 AM

## 2012-11-15 ENCOUNTER — Other Ambulatory Visit: Payer: Self-pay | Admitting: Obstetrics and Gynecology

## 2012-11-15 ENCOUNTER — Inpatient Hospital Stay (HOSPITAL_COMMUNITY)
Admission: AD | Admit: 2012-11-15 | Discharge: 2012-11-16 | Disposition: A | Discharge status: Home / Self Care | Payer: Medicaid Other | Source: Ambulatory Visit | Attending: Obstetrics & Gynecology | Admitting: Obstetrics & Gynecology

## 2012-11-15 ENCOUNTER — Encounter (HOSPITAL_COMMUNITY): Payer: Self-pay | Admitting: *Deleted

## 2012-11-15 ENCOUNTER — Encounter: Payer: Self-pay | Admitting: Obstetrics and Gynecology

## 2012-11-15 ENCOUNTER — Ambulatory Visit (INDEPENDENT_AMBULATORY_CARE_PROVIDER_SITE_OTHER): Payer: Medicaid Other | Admitting: Obstetrics and Gynecology

## 2012-11-15 VITALS — BP 148/88 | Temp 98.7°F | Ht 64.0 in | Wt 234.6 lb

## 2012-11-15 DIAGNOSIS — Z3480 Encounter for supervision of other normal pregnancy, unspecified trimester: Secondary | ICD-10-CM

## 2012-11-15 DIAGNOSIS — O368390 Maternal care for abnormalities of the fetal heart rate or rhythm, unspecified trimester, not applicable or unspecified: Secondary | ICD-10-CM | POA: Insufficient documentation

## 2012-11-15 DIAGNOSIS — O0990 Supervision of high risk pregnancy, unspecified, unspecified trimester: Secondary | ICD-10-CM | POA: Insufficient documentation

## 2012-11-15 DIAGNOSIS — A749 Chlamydial infection, unspecified: Secondary | ICD-10-CM

## 2012-11-15 DIAGNOSIS — R109 Unspecified abdominal pain: Secondary | ICD-10-CM | POA: Insufficient documentation

## 2012-11-15 DIAGNOSIS — Z3682 Encounter for antenatal screening for nuchal translucency: Secondary | ICD-10-CM

## 2012-11-15 DIAGNOSIS — O0991 Supervision of high risk pregnancy, unspecified, first trimester: Secondary | ICD-10-CM

## 2012-11-15 DIAGNOSIS — D680 Von Willebrand's disease: Secondary | ICD-10-CM

## 2012-11-15 DIAGNOSIS — Z3490 Encounter for supervision of normal pregnancy, unspecified, unspecified trimester: Secondary | ICD-10-CM

## 2012-11-15 DIAGNOSIS — A562 Chlamydial infection of genitourinary tract, unspecified: Secondary | ICD-10-CM

## 2012-11-15 DIAGNOSIS — A6000 Herpesviral infection of urogenital system, unspecified: Secondary | ICD-10-CM | POA: Insufficient documentation

## 2012-11-15 DIAGNOSIS — O99891 Other specified diseases and conditions complicating pregnancy: Secondary | ICD-10-CM | POA: Insufficient documentation

## 2012-11-15 LAB — POCT URINALYSIS DIP (DEVICE)
Nitrite: NEGATIVE
Protein, ur: NEGATIVE mg/dL
pH: 6.5 (ref 5.0–8.0)

## 2012-11-15 NOTE — Progress Notes (Signed)
Nutrition note: 1st visit consult Pt has h/o obesity. Pt has gained 8.6# @ [redacted]w[redacted]d, which is > expected Pt reports eating 2 meals & 3 snacks/d most days but works & goes to school so she doesn't always have time to eat regularly. Pt is taking PNV. Pt reports having nausea occ but no heartburn. Pt received verbal & written education on general nutrition during pregnancy. Disc tips to decrease nausea. Encouraged protein with all meals & snacks. Disc wt gain goals of 11-20# total or 0.5#/wk in 2nd & 3rd trimesters. Pt agrees to cont taking PNV. Pt does not have WIC but plans to apply. Pt plans to BF. F/u if referred Blondell Reveal, MS, RD, LDN

## 2012-11-15 NOTE — Patient Instructions (Signed)
Pregnancy - First Trimester During sexual intercourse, millions of sperm go into the vagina. Only 1 sperm will penetrate and fertilize the female egg while it is in the Fallopian tube. One week later, the fertilized egg implants into the wall of the uterus. An embryo begins to develop into a baby. At 6 to 8 weeks, the eyes and face are formed and the heartbeat can be seen on ultrasound. At the end of 12 weeks (first trimester), all the baby's organs are formed. Now that you are pregnant, you will want to do everything you can to have a healthy baby. Two of the most important things are to get good prenatal care and follow your caregiver's instructions. Prenatal care is all the medical care you receive before the baby's birth. It is given to prevent, find, and treat problems during the pregnancy and childbirth. PRENATAL EXAMS  During prenatal visits, your weight, blood pressure and urine are checked. This is done to make sure you are healthy and progressing normally during the pregnancy.  A pregnant woman should gain 25 to 35 pounds during the pregnancy. However, if you are over weight or underweight, your caregiver will advise you regarding your weight.  Your caregiver will ask and answer questions for you.  Blood work, cervical cultures, other necessary tests and a Pap test are done during your prenatal exams. These tests are done to check on your health and the probable health of your baby. Tests are strongly recommended and done for HIV with your permission. This is the virus that causes AIDS. These tests are done because medications can be given to help prevent your baby from being born with this infection should you have been infected without knowing it. Blood work is also used to find out your blood type, previous infections and follow your blood levels (hemoglobin).  Low hemoglobin (anemia) is common during pregnancy. Iron and vitamins are given to help prevent this. Later in the pregnancy,  blood tests for diabetes will be done along with any other tests if any problems develop. You may need tests to make sure you and the baby are doing well.  You may need other tests to make sure you and the baby are doing well. CHANGES DURING THE FIRST TRIMESTER (THE FIRST 3 MONTHS OF PREGNANCY) Your body goes through many changes during pregnancy. They vary from person to person. Talk to your caregiver about changes you notice and are concerned about. Changes can include:  Your menstrual period stops.  The egg and sperm carry the genes that determine what you look like. Genes from you and your partner are forming a baby. The female genes determine whether the baby is a boy or a girl.  Your body increases in girth and you may feel bloated.  Feeling sick to your stomach (nauseous) and throwing up (vomiting). If the vomiting is uncontrollable, call your caregiver.  Your breasts will begin to enlarge and become tender.  Your nipples may stick out more and become darker.  The need to urinate more. Painful urination may mean you have a bladder infection.  Tiring easily.  Loss of appetite.  Cravings for certain kinds of food.  At first, you may gain or lose a couple of pounds.  You may have changes in your emotions from day to day (excited to be pregnant or concerned something may go wrong with the pregnancy and baby).  You may have more vivid and strange dreams. HOME CARE INSTRUCTIONS   It is very important  to avoid all smoking, alcohol and un-prescribed drugs during your pregnancy. These affect the formation and growth of the baby. Avoid chemicals while pregnant to ensure the delivery of a healthy infant.  Start your prenatal visits by the 12th week of pregnancy. They are usually scheduled monthly at first, then more often in the last 2 months before delivery. Keep your caregiver's appointments. Follow your caregiver's instructions regarding medication use, blood and lab tests, exercise,  and diet.  During pregnancy, you are providing food for you and your baby. Eat regular, well-balanced meals. Choose foods such as meat, fish, milk and other low fat dairy products, vegetables, fruits, and whole-grain breads and cereals. Your caregiver will tell you of the ideal weight gain.  You can help morning sickness by keeping soda crackers at the bedside. Eat a couple before arising in the morning. You may want to use the crackers without salt on them.  Eating 4 to 5 small meals rather than 3 large meals a day also may help the nausea and vomiting.  Drinking liquids between meals instead of during meals also seems to help nausea and vomiting.  A physical sexual relationship may be continued throughout pregnancy if there are no other problems. Problems may be early (premature) leaking of amniotic fluid from the membranes, vaginal bleeding, or belly (abdominal) pain.  Exercise regularly if there are no restrictions. Check with your caregiver or physical therapist if you are unsure of the safety of some of your exercises. Greater weight gain will occur in the last 2 trimesters of pregnancy. Exercising will help:  Control your weight.  Keep you in shape.  Prepare you for labor and delivery.  Help you lose your pregnancy weight after you deliver your baby.  Wear a good support or jogging bra for breast tenderness during pregnancy. This may help if worn during sleep too.  Ask when prenatal classes are available. Begin classes when they are offered.  Do not use hot tubs, steam rooms or saunas.  Wear your seat belt when driving. This protects you and your baby if you are in an accident.  Avoid raw meat, uncooked cheese, cat litter boxes and soil used by cats throughout the pregnancy. These carry germs that can cause birth defects in the baby.  The first trimester is a good time to visit your dentist for your dental health. Getting your teeth cleaned is OK. Use a softer toothbrush and  brush gently during pregnancy.  Ask for help if you have financial, counseling or nutritional needs during pregnancy. Your caregiver will be able to offer counseling for these needs as well as refer you for other special needs.  Do not take any medications or herbs unless told by your caregiver.  Inform your caregiver if there is any mental or physical domestic violence.  Make a list of emergency phone numbers of family, friends, hospital, and police and fire departments.  Write down your questions. Take them to your prenatal visit.  Do not douche.  Do not cross your legs.  If you have to stand for long periods of time, rotate you feet or take small steps in a circle.  You may have more vaginal secretions that may require a sanitary pad. Do not use tampons or scented sanitary pads. MEDICATIONS AND DRUG USE IN PREGNANCY  Take prenatal vitamins as directed. The vitamin should contain 1 milligram of folic acid. Keep all vitamins out of reach of children. Only a couple vitamins or tablets containing iron may be  fatal to a baby or young child when ingested.  Avoid use of all medications, including herbs, over-the-counter medications, not prescribed or suggested by your caregiver. Only take over-the-counter or prescription medicines for pain, discomfort, or fever as directed by your caregiver. Do not use aspirin, ibuprofen, or naproxen unless directed by your caregiver.  Let your caregiver also know about herbs you may be using.  Alcohol is related to a number of birth defects. This includes fetal alcohol syndrome. All alcohol, in any form, should be avoided completely. Smoking will cause low birth rate and premature babies.  Street or illegal drugs are very harmful to the baby. They are absolutely forbidden. A baby born to an addicted mother will be addicted at birth. The baby will go through the same withdrawal an adult does.  Let your caregiver know about any medications that you have to  take and for what reason you take them. MISCARRIAGE IS COMMON DURING PREGNANCY A miscarriage does not mean you did something wrong. It is not a reason to worry about getting pregnant again. Your caregiver will help you with questions you may have. If you have a miscarriage, you may need minor surgery. SEEK MEDICAL CARE IF:  You have any concerns or worries during your pregnancy. It is better to call with your questions if you feel they cannot wait, rather than worry about them. SEEK IMMEDIATE MEDICAL CARE IF:   An unexplained oral temperature above 100.4 F (38 C) develops, or as your caregiver suggests.  You have leaking of fluid from the vagina (birth canal). If leaking membranes are suspected, take your temperature and inform your caregiver of this when you call.  There is vaginal spotting or bleeding. Notify your caregiver of the amount and how many pads are used.  You develop a bad smelling vaginal discharge with a change in the color.  You continue to feel sick to your stomach (nauseated) and have no relief from remedies suggested. You vomit blood or coffee ground-like materials.  You lose more than 2 pounds of weight in 1 week.  You gain more than 2 pounds of weight in 1 week and you notice swelling of your face, hands, feet, or legs.  You gain 5 pounds or more in 1 week (even if you do not have swelling of your hands, face, legs, or feet).  You get exposed to [place] measles and have never had them.  You are exposed to fifth disease or chickenpox.  You develop belly (abdominal) pain. Round ligament discomfort is a common non-cancerous (benign) cause of abdominal pain in pregnancy. Your caregiver still must evaluate this.  You develop headache, fever, diarrhea, pain with urination, or shortness of breath.  You fall or are in a car accident or have any kind of trauma.  There is mental or physical violence in your home. Document Released: 08/09/2001 Document Revised: 11/07/2011  Document Reviewed: 02/10/2009 [facility] Patient Information 2013 [place], [place].  Contraception Choices Contraception (birth control) is the use of any methods or devices to prevent pregnancy. Below are some methods to help avoid pregnancy. HORMONAL METHODS   Contraceptive implant. This is a thin, plastic tube containing progesterone hormone. It does not contain estrogen hormone. Your caregiver inserts the tube in the inner part of the upper arm. The tube can remain in place for up to 3 years. After 3 years, the implant must be removed. The implant prevents the ovaries from releasing an egg (ovulation), thickens the cervical mucus which prevents sperm from entering  the uterus, and thins the lining of the inside of the uterus.  Progesterone-only injections. These injections are given every 3 months by your caregiver to prevent pregnancy. This synthetic progesterone hormone stops the ovaries from releasing eggs. It also thickens cervical mucus and changes the uterine lining. This makes it harder for sperm to survive in the uterus.  Birth control pills. These pills contain estrogen and progesterone hormone. They work by stopping the egg from forming in the ovary (ovulation). Birth control pills are prescribed by a caregiver.Birth control pills can also be used to treat heavy periods.  Minipill. This type of birth control pill contains only the progesterone hormone. They are taken every day of each month and must be prescribed by your caregiver.  Birth control patch. The patch contains hormones similar to those in birth control pills. It must be changed once a week and is prescribed by a caregiver.  Vaginal ring. The ring contains hormones similar to those in birth control pills. It is left in the vagina for 3 weeks, removed for 1 week, and then a new one is put back in place. The patient must be comfortable inserting and removing the ring from the vagina.A caregiver's prescription is  necessary.  Emergency contraception. Emergency contraceptives prevent pregnancy after unprotected sexual intercourse. This pill can be taken right after sex or up to 5 days after unprotected sex. It is most effective the sooner you take the pills after having sexual intercourse. Emergency contraceptive pills are available without a prescription. Check with your pharmacist. Do not use emergency contraception as your only form of birth control. BARRIER METHODS   Female condom. This is a thin sheath (latex or rubber) that is worn over the penis during sexual intercourse. It can be used with spermicide to increase effectiveness.  Female condom. This is a soft, loose-fitting sheath that is put into the vagina before sexual intercourse.  Diaphragm. This is a soft, latex, dome-shaped barrier that must be fitted by a caregiver. It is inserted into the vagina, along with a spermicidal jelly. It is inserted before intercourse. The diaphragm should be left in the vagina for 6 to 8 hours after intercourse.  Cervical cap. This is a round, soft, latex or plastic cup that fits over the cervix and must be fitted by a caregiver. The cap can be left in place for up to 48 hours after intercourse.  Sponge. This is a soft, circular piece of polyurethane foam. The sponge has spermicide in it. It is inserted into the vagina after wetting it and before sexual intercourse.  Spermicides. These are chemicals that kill or block sperm from entering the cervix and uterus. They come in the form of creams, jellies, suppositories, foam, or tablets. They do not require a prescription. They are inserted into the vagina with an applicator before having sexual intercourse. The process must be repeated every time you have sexual intercourse. INTRAUTERINE CONTRACEPTION  Intrauterine device (IUD). This is a T-shaped device that is put in a woman's uterus during a menstrual period to prevent pregnancy. There are 2 types:  Copper IUD. This  type of IUD is wrapped in copper wire and is placed inside the uterus. Copper makes the uterus and fallopian tubes produce a fluid that kills sperm. It can stay in place for 10 years.  Hormone IUD. This type of IUD contains the hormone progestin (synthetic progesterone). The hormone thickens the cervical mucus and prevents sperm from entering the uterus, and it also thins the uterine  lining to prevent implantation of a fertilized egg. The hormone can weaken or kill the sperm that get into the uterus. It can stay in place for 5 years. PERMANENT METHODS OF CONTRACEPTION  Female tubal ligation. This is when the woman's fallopian tubes are surgically sealed, tied, or blocked to prevent the egg from traveling to the uterus.  Female sterilization. This is when the female has the tubes that carry sperm tied off (vasectomy).This blocks sperm from entering the vagina during sexual intercourse. After the procedure, the man can still ejaculate fluid (semen). NATURAL PLANNING METHODS  Natural family planning. This is not having sexual intercourse or using a barrier method (condom, diaphragm, cervical cap) on days the woman could become pregnant.  Calendar method. This is keeping track of the length of each menstrual cycle and identifying when you are fertile.  Ovulation method. This is avoiding sexual intercourse during ovulation.  Symptothermal method. This is avoiding sexual intercourse during ovulation, using a thermometer and ovulation symptoms.  Post-ovulation method. This is timing sexual intercourse after you have ovulated. Regardless of which type or method of contraception you choose, it is important that you use condoms to protect against the transmission of sexually transmitted diseases (STDs). Talk with your caregiver about which form of contraception is most appropriate for you. Document Released: 08/15/2005 Document Revised: 11/07/2011 Document Reviewed: 12/22/2010 [facility] Patient Information  2013 [place], [place].  Breastfeeding Deciding to breastfeed is one of the best choices you can make for you and your baby. The information that follows gives a brief overview of the benefits of breastfeeding as well as common topics surrounding breastfeeding. BENEFITS OF BREASTFEEDING For the baby  The first milk (colostrum) helps the baby's digestive system function better.   There are antibodies in the mother's milk that help the baby fight off infections.   The baby has a lower incidence of asthma, allergies, and sudden infant death syndrome (SIDS).   The nutrients in breast milk are better for the baby than infant formulas, and breast milk helps the baby's brain grow better.   Babies who breastfeed have less gas, colic, and constipation.  For the mother  Breastfeeding helps develop a very special bond between the mother and her baby.   Breastfeeding is convenient, always available at the correct temperature, and costs nothing.   Breastfeeding burns calories in the mother and helps her lose weight that was gained during pregnancy.   Breastfeeding makes the uterus contract back down to normal size faster and slows bleeding following delivery.   Breastfeeding mothers have a lower risk of developing breast cancer.  BREASTFEEDING FREQUENCY  A healthy, full-term baby may breastfeed as often as every hour or space his or her feedings to every 3 hours.   Watch your baby for signs of hunger. Nurse your baby if he or she shows signs of hunger. How often you nurse will vary from baby to baby.   Nurse as often as the baby requests, or when you feel the need to reduce the fullness of your breasts.   Awaken the baby if it has been 3 4 hours since the last feeding.   Frequent feeding will help the mother make more milk and will help prevent problems, such as sore nipples and engorgement of the breasts.  BABY'S POSITION AT THE BREAST  Whether lying down or sitting, be sure  that the baby's tummy is facing your tummy.   Support the breast with 4 fingers underneath the breast and the thumb above.  Make sure your fingers are well away from the nipple and baby's mouth.   Stroke the baby's lips gently with your finger or nipple.   When the baby's mouth is open wide enough, place all of your nipple and as much of the areola as possible into your baby's mouth.   Pull the baby in close so the tip of the nose and the baby's cheeks touch the breast during the feeding.  FEEDINGS AND SUCTION  The length of each feeding varies from baby to baby and from feeding to feeding.   The baby must suck about 2 3 minutes for your milk to get to him or her. This is called a "let down." For this reason, allow the baby to feed on each breast as long as he or she wants. Your baby will end the feeding when he or she has received the right balance of nutrients.   To break the suction, put your finger into the corner of the baby's mouth and slide it between his or her gums before removing your breast from his or her mouth. This will help prevent sore nipples.  HOW TO TELL WHETHER YOUR BABY IS GETTING ENOUGH BREAST MILK. Wondering whether or not your baby is getting enough milk is a common concern among mothers. You can be assured that your baby is getting enough milk if:   Your baby is actively sucking and you hear swallowing.   Your baby seems relaxed and satisfied after a feeding.   Your baby nurses at least 8 12 times in a 24 hour time period. Nurse your baby until he or she unlatches or falls asleep at the first breast (at least 10 20 minutes), then offer the second side.   Your baby is wetting 5 6 disposable diapers (6 8 cloth diapers) in a 24 hour period by 43 50 days of age.   Your baby is having at least 3 4 stools every 24 hours for the first 6 weeks. The stool should be soft and yellow.   Your baby should gain 4 7 ounces per week after he or she is 68 days old.    Your breasts feel softer after nursing.  REDUCING BREAST ENGORGEMENT  In the first week after your baby is born, you may experience signs of breast engorgement. When breasts are engorged, they feel heavy, warm, full, and may be tender to the touch. You can reduce engorgement if you:   Nurse frequently, every 2 3 hours. Mothers who breastfeed early and often have fewer problems with engorgement.   Place light ice packs on your breasts for 10 20 minutes between feedings. This reduces swelling. Wrap the ice packs in a lightweight towel to protect your skin. Bags of frozen vegetables work well for this purpose.   Take a warm shower or apply warm, moist heat to your breast for 5 10 minutes just before each feeding. This increases circulation and helps the milk flow.   Gently massage your breast before and during the feeding. Using your finger tips, massage from the chest wall towards your nipple in a circular motion.   Make sure that the baby empties at least one breast at every feeding before switching sides.   Use a breast pump to empty the breasts if your baby is sleepy or not nursing well. You may also want to pump if you are returning to work oryou feel you are getting engorged.   Avoid bottle feeds, pacifiers, or supplemental feedings of water  or juice in place of breastfeeding. Breast milk is all the food your baby needs. It is not necessary for your baby to have water or formula. In fact, to help your breasts make more milk, it is best not to give your baby supplemental feedings during the early weeks.   Be sure the baby is latched on and positioned properly while breastfeeding.   Wear a supportive bra, avoiding underwire styles.   Eat a balanced diet with enough fluids.   Rest often, relax, and take your prenatal vitamins to prevent fatigue, stress, and anemia.  If you follow these suggestions, your engorgement should improve in 24 48 hours. If you are still  experiencing difficulty, call your lactation consultant or caregiver.  CARING FOR YOURSELF Take care of your breasts  Bathe or shower daily.   Avoid using soap on your nipples.   Start feedings on your left breast at one feeding and on your right breast at the next feeding.   You will notice an increase in your milk supply 2 5 days after delivery. You may feel some discomfort from engorgement, which makes your breasts very firm and often tender. Engorgement "peaks" out within 24 48 hours. In the meantime, apply warm moist towels to your breasts for 5 10 minutes before feeding. Gentle massage and expression of some milk before feeding will soften your breasts, making it easier for your baby to latch on.   Wear a well-fitting nursing bra, and air dry your nipples for a 3 after each feeding.   Only use cotton bra pads.   Only use pure lanolin on your nipples after nursing. You do not need to wash it off before feeding the baby again. Another option is to express a few drops of breast milk and gently massage it into your nipples.  Take care of yourself  Eat well-balanced meals and nutritious snacks.   Drinking milk, fruit juice, and water to satisfy your thirst (about 8 glasses a day).   Get plenty of rest.  Avoid foods that you notice affect the baby in a bad way.  SEEK MEDICAL CARE IF:   You have difficulty with breastfeeding and need help.   You have a hard, red, sore area on your breast that is accompanied by a fever.   Your baby is too sleepy to eat well or is having trouble sleeping.   Your baby is wetting less than 6 diapers a day, by 46 days of age.   Your baby's skin or white part of his or her eyes is more yellow than it was in the hospital.   You feel depressed.  Document Released: 08/15/2005 Document Revised: 02/14/2012 Document Reviewed: 11/13/2011 [facility] Patient Information 2013 [place], [place].

## 2012-11-15 NOTE — MAU Provider Note (Signed)
Chief Complaint: Abdominal Cramping   First Provider Initiated Contact with Patient 11/15/12 2316     SUBJECTIVE HPI: Carly Rush is a 22 y.o. G2P0010 at [redacted]w[redacted]d by LMP who presents to maternity admissions reporting abdominal cramping.  She had initial prenatal visit in [facility] today and they were unable to hear FHT on doppler.  She is tearful and worried about this.  She has not had an U/S during this pregnancy and is fairly certain of her LMP and pregnancy dating. She denies vaginal bleeding, vaginal itching/burning, urinary symptoms, h/a, dizziness, n/v, or fever/chills.     Past Medical History  Diagnosis Date  . Asthma   . Headache   . Gonorrhea 2011  . Chlamydia 2011  . Von Willebrand disease    Past Surgical History  Procedure Laterality Date  . Tonsillectomy    . Induced abortion     History   Social History  . Marital Status: Married    Spouse Name: N/A    Number of Children: N/A  . Years of Education: N/A   Occupational History  . Not on file.   Social History Main Topics  . Smoking status: Former Games developer  . Smokeless tobacco: Never Used  . Alcohol Use: No     Comment: 3-4 drinks occasionally  . Drug Use: No  . Sexually Active: Yes    Birth Control/ Protection: None   Other Topics Concern  . Not on file   Social History Narrative  . No narrative on file   No current facility-administered medications on file prior to encounter.   Current Outpatient Prescriptions on File Prior to Encounter  Medication Sig Dispense Refill  . Prenatal Vit-Fe Fumarate-FA (PRENATAL MULTIVITAMIN) TABS Take 1 tablet by mouth daily.       No Known Allergies  ROS: Pertinent items in HPI  OBJECTIVE Blood pressure 147/69, pulse 91, temperature 98 F (36.7 C), temperature source Oral, resp. rate 16, height 5\' 3"  (1.6 m), weight 108.138 kg (238 lb 6.4 oz), last menstrual period 09/02/2012.  GENERAL: Well-developed, well-nourished female in no acute distress.  HEENT:  Normocephalic HEART: normal rate RESP: normal effort ABDOMEN: Soft, non-tender EXTREMITIES: Nontender, no edema NEURO: Alert and oriented SPECULUM EXAM: Deferred--done in clinic today  LAB RESULTS Results for orders placed during the hospital encounter of 11/15/12 (from the past 24 hour(s))  HCG, QUANTITATIVE, PREGNANCY     Status: Abnormal   Collection Time    11/15/12 11:20 PM      Result Value Range   hCG, Beta Chain, Quant, S 38257 (*) <5 mIU/mL    IMAGING US Ob Comp Less 14 Wks  11/16/2012  *RADIOLOGY REPORT*  Clinical Data: Lower abdominal pain.  Estimated gestational age by LMP is 10 weeks 5 days.  OBSTETRIC <14 WK ULTRASOUND  Technique:  Transabdominal ultrasound was performed for evaluation of the gestation as well as the maternal uterus and adnexal regions.  Comparison:  10/17/2011  Intrauterine gestational sac: A single intrauterine pregnancy is identified. Yolk sac: The yolk sac is not visualized. Embryo: The fetal pole is visualized. Cardiac Activity: Fetal cardiac activity is observed. Heart Rate: 176 bpm  CRL:  43.5 mm  11 w  2 d            [place] EDC: 06/05/2013  Maternal uterus/Adnexae: The uterus is anteverted.  No focal myometrial mass or subchorionic hemorrhage demonstrated.  The right ovary is visualized adjacent to the uterus and is not enlarged.  Left ovary is not visualized.  No abnormal adnexal masses.  No free pelvic fluid collections.  IMPRESSION: Single intrauterine pregnancy demonstrated.  Estimated gestational age by crown-rump length is 11 weeks 2 days.  Fetal cardiac activity is observed.   Original Report Authenticated By: Burman Nieves, M.D.     ASSESSMENT 1. Normal IUP (intrauterine pregnancy) on prenatal ultrasound     PLAN Discharge home Keep scheduled prenatal visits in [facility] Return to MAU as needed    Medication List    ASK your doctor about these medications       prenatal multivitamin Tabs  Take 1 tablet by mouth daily.         Sharen Counter Certified Nurse-Midwife 11/15/2012  11:24 PM

## 2012-11-15 NOTE — Progress Notes (Signed)
Pulse: 108 Patient has von willebrand. She is sure of her lmp.  1hr gtt today due @1053 

## 2012-11-15 NOTE — MAU Note (Signed)
Pt G2 P0 at 10.4wks seen today in the office MD was unable to get fetal heart tones.  Pt very worried and scared.  Having lower abd cramping, denies bleeding.

## 2012-11-15 NOTE — Progress Notes (Signed)
   Subjective:    Carly Rush is a G2P0010 [redacted]w[redacted]d being seen today for her first obstetrical visit.  Her obstetrical history is significant for h/o von willenbrand disease and genital herpes infection. Her last outbreak was in 2012. Patient reports being diasgnosed with VWD at time of menarche. However, she reports her cycles lasting 3-4 days without the use of birth control. She denies any history of heavy bleeding following dental procedures or easy bruising. She reports abnormal uterine bleeding for 5 months for which she was treated with provera, prior to pregnancy. Patient does intend to breast feed. Pregnancy history fully reviewed.  Patient reports no complaints.  Filed Vitals:   11/15/12 0946  BP: 148/88  Temp: 98.7 F (37.1 C)  Weight: 234 lb 9.6 oz (106.414 kg)    HISTORY: OB History   Grav Para Term Preterm Abortions TAB SAB Ect Mult Living   2 0 0 0 1 1 0 0 0 0      # Outc Date GA Lbr Len/2nd Wgt Sex Del Anes PTL Lv   1 TAB            2 CUR              Past Medical History  Diagnosis Date  . Asthma   . Headache   . Gonorrhea 2011  . Chlamydia 2011  . Von Willebrand disease    Past Surgical History  Procedure Laterality Date  . Tonsillectomy    . Induced abortion     Family History  Problem Relation Age of Onset  . Anesthesia problems Neg Hx      Exam    Uterus:     Pelvic Exam:    Perineum: Normal Perineum   Vulva: normal   Vagina:  normal mucosa, normal discharge   pH:    Cervix: closed and long   Adnexa: no mass, fullness, tenderness   Bony Pelvis: android  System: Breast:  normal appearance, no masses or tenderness   Skin: normal coloration and turgor, no rashes    Neurologic: oriented, normal   Extremities: normal strength, tone, and muscle mass   HEENT extra ocular movement intact   Mouth/Teeth mucous membranes moist, pharynx normal without lesions and dental hygiene good   Neck supple and no masses   Cardiovascular: regular rate and  rhythm   Respiratory:  chest clear, no wheezing, crepitations, rhonchi, normal symmetric air entry   Abdomen: soft, non-tender; bowel sounds normal; no masses,  no organomegaly obese   Urinary:       Assessment:    Pregnancy: G2P0010 Patient Active Problem List  Diagnosis  . Menometrorrhagia  . Von Willebrand disease  . Chlamydial infection of genitourinary tract  . Supervision of high-risk pregnancy  . Herpes genitalia        Plan:     Initial labs drawn, including early 1 hr GCT Prenatal vitamins. Problem list reviewed and updated. Genetic Screening discussed First Screen: ordered.  Ultrasound discussed; fetal survey: requested. Reviewed healthy eating and exercise in pregnancy Recommended drinking 8-10 glasses of water daily Will obtain records re: Von Willebrand disease, as it does not appear to be a very severe form.  Follow up in 4 weeks. 50% of 30 min visit spent on counseling and coordination of care.     Travares Nelles 11/15/2012

## 2012-11-15 NOTE — MAU Note (Signed)
Pt states she was "feeling kinda bad and was kinda worried that her baby isn't OK." Pt states Dr said that she could not hear a heart beat" Pt asked" how do I know my baby is ok?"" Well you wont know but if there was it is too early to do anything" Pt states she was having cramping earlier and became worried.

## 2012-11-16 ENCOUNTER — Encounter (HOSPITAL_COMMUNITY): Payer: Self-pay | Admitting: Advanced Practice Midwife

## 2012-11-16 ENCOUNTER — Encounter: Payer: Self-pay | Admitting: Obstetrics and Gynecology

## 2012-11-16 ENCOUNTER — Inpatient Hospital Stay (HOSPITAL_COMMUNITY): Payer: Medicaid Other

## 2012-11-16 DIAGNOSIS — O26899 Other specified pregnancy related conditions, unspecified trimester: Secondary | ICD-10-CM | POA: Insufficient documentation

## 2012-11-16 DIAGNOSIS — Z1389 Encounter for screening for other disorder: Secondary | ICD-10-CM

## 2012-11-16 DIAGNOSIS — Z6791 Unspecified blood type, Rh negative: Secondary | ICD-10-CM | POA: Insufficient documentation

## 2012-11-16 LAB — OBSTETRIC PANEL
Basophils Absolute: 0 10*3/uL (ref 0.0–0.1)
Basophils Relative: 0 % (ref 0–1)
Eosinophils Absolute: 0.1 10*3/uL (ref 0.0–0.7)
Hepatitis B Surface Ag: NEGATIVE
MCH: 27 pg (ref 26.0–34.0)
MCHC: 34.6 g/dL (ref 30.0–36.0)
Neutrophils Relative %: 70 % (ref 43–77)
Platelets: 233 10*3/uL (ref 150–400)
RBC: 4.81 MIL/uL (ref 3.87–5.11)

## 2012-11-19 LAB — HEMOGLOBINOPATHY EVALUATION
Hemoglobin Other: 0 %
Hgb A2 Quant: 2.5 % (ref 2.2–3.2)
Hgb A: 97.5 % (ref 96.8–97.8)
Hgb S Quant: 0 %

## 2012-12-03 ENCOUNTER — Ambulatory Visit (HOSPITAL_COMMUNITY)
Admission: RE | Admit: 2012-12-03 | Discharge: 2012-12-03 | Disposition: A | Discharge status: Home / Self Care | Payer: Medicaid Other | Source: Ambulatory Visit | Attending: Obstetrics & Gynecology | Admitting: Obstetrics & Gynecology

## 2012-12-03 ENCOUNTER — Ambulatory Visit (HOSPITAL_COMMUNITY): Admission: RE | Admit: 2012-12-03 | Payer: Medicaid Other | Source: Ambulatory Visit

## 2012-12-03 ENCOUNTER — Other Ambulatory Visit: Payer: Self-pay | Admitting: Obstetrics and Gynecology

## 2012-12-03 DIAGNOSIS — Z3689 Encounter for other specified antenatal screening: Secondary | ICD-10-CM | POA: Insufficient documentation

## 2012-12-03 DIAGNOSIS — O351XX0 Maternal care for (suspected) chromosomal abnormality in fetus, not applicable or unspecified: Secondary | ICD-10-CM | POA: Insufficient documentation

## 2012-12-03 DIAGNOSIS — Z3682 Encounter for antenatal screening for nuchal translucency: Secondary | ICD-10-CM

## 2012-12-03 DIAGNOSIS — O3510X0 Maternal care for (suspected) chromosomal abnormality in fetus, unspecified, not applicable or unspecified: Secondary | ICD-10-CM | POA: Insufficient documentation

## 2012-12-13 ENCOUNTER — Other Ambulatory Visit: Payer: Self-pay | Admitting: Obstetrics & Gynecology

## 2012-12-13 ENCOUNTER — Encounter: Payer: Self-pay | Admitting: Obstetrics and Gynecology

## 2012-12-13 ENCOUNTER — Ambulatory Visit (INDEPENDENT_AMBULATORY_CARE_PROVIDER_SITE_OTHER): Payer: Medicaid Other | Admitting: Obstetrics & Gynecology

## 2012-12-13 ENCOUNTER — Encounter: Payer: Self-pay | Admitting: *Deleted

## 2012-12-13 DIAGNOSIS — O0990 Supervision of high risk pregnancy, unspecified, unspecified trimester: Secondary | ICD-10-CM

## 2012-12-13 DIAGNOSIS — O10019 Pre-existing essential hypertension complicating pregnancy, unspecified trimester: Secondary | ICD-10-CM

## 2012-12-13 LAB — POCT URINALYSIS DIP (DEVICE)
Bilirubin Urine: NEGATIVE
Glucose, UA: NEGATIVE mg/dL
Hgb urine dipstick: NEGATIVE
Nitrite: NEGATIVE

## 2012-12-13 MED ORDER — LABETALOL HCL 100 MG PO TABS: 100.0000 mg | ORAL_TABLET | Freq: Two times a day (BID) | ORAL | Status: DC

## 2012-12-13 NOTE — Progress Notes (Signed)
BP elevated.  Chronic hypertension.  Will get baseline labs and 24 hour urine.  Start labetalol 100 mg bid.

## 2012-12-13 NOTE — Progress Notes (Signed)
Pulse- 84  Pt c/o migraines "I have on like everyday"

## 2012-12-17 ENCOUNTER — Ambulatory Visit: Payer: Self-pay

## 2012-12-17 VITALS — BP 140/81

## 2012-12-17 DIAGNOSIS — O0990 Supervision of high risk pregnancy, unspecified, unspecified trimester: Secondary | ICD-10-CM

## 2012-12-17 LAB — COMPREHENSIVE METABOLIC PANEL
CO2: 24 mEq/L (ref 19–32)
Calcium: 9.5 mg/dL (ref 8.4–10.5)
Chloride: 104 mEq/L (ref 96–112)
Creat: 0.51 mg/dL (ref 0.50–1.10)
Glucose, Bld: 101 mg/dL — ABNORMAL HIGH (ref 70–99)
Sodium: 137 mEq/L (ref 135–145)
Total Bilirubin: 0.3 mg/dL (ref 0.3–1.2)
Total Protein: 6.5 g/dL (ref 6.0–8.3)

## 2012-12-17 NOTE — Addendum Note (Signed)
Addended by: Franchot Mimes on: 12/17/2012 09:17 AM   Modules accepted: Orders

## 2012-12-18 LAB — PROTEIN, URINE, 24 HOUR: Protein, Urine: 5 mg/dL

## 2012-12-18 LAB — CREATININE, URINE, 24 HOUR
Creatinine, 24H Ur: 1591 mg/d (ref 700–1800)
Creatinine, Urine: 124.8 mg/dL

## 2012-12-25 ENCOUNTER — Encounter: Payer: Self-pay | Admitting: Obstetrics & Gynecology

## 2012-12-25 DIAGNOSIS — O10019 Pre-existing essential hypertension complicating pregnancy, unspecified trimester: Secondary | ICD-10-CM | POA: Insufficient documentation

## 2012-12-29 ENCOUNTER — Encounter (HOSPITAL_COMMUNITY): Payer: Self-pay | Admitting: *Deleted

## 2012-12-29 ENCOUNTER — Inpatient Hospital Stay (HOSPITAL_COMMUNITY)
Admission: AD | Admit: 2012-12-29 | Discharge: 2012-12-30 | Disposition: A | Discharge status: Home / Self Care | Payer: Medicaid Other | Source: Ambulatory Visit | Attending: Obstetrics & Gynecology | Admitting: Obstetrics & Gynecology

## 2012-12-29 DIAGNOSIS — R42 Dizziness and giddiness: Secondary | ICD-10-CM | POA: Insufficient documentation

## 2012-12-29 DIAGNOSIS — O99891 Other specified diseases and conditions complicating pregnancy: Secondary | ICD-10-CM | POA: Insufficient documentation

## 2012-12-29 LAB — URINE MICROSCOPIC-ADD ON

## 2012-12-29 LAB — URINALYSIS, ROUTINE W REFLEX MICROSCOPIC
Bilirubin Urine: NEGATIVE
Glucose, UA: NEGATIVE mg/dL
Hgb urine dipstick: NEGATIVE
Specific Gravity, Urine: >1.03 — ABNORMAL HIGH (ref 1.005–1.030)
Urobilinogen, UA: 0.2 mg/dL (ref 0.0–1.0)

## 2012-12-29 NOTE — MAU Provider Note (Signed)
History     CSN: [id number]  Arrival date and time: 12/29/12 2225   None     Chief Complaint  Patient presents with  . Dizziness   HPI  Pt is a G0010 at 16.6 wks IUP here with report of dizziness off and on since Weds. Night. Feels like I'm spinning. Worse since 1900. (Pt drove herself here). Also reports ramping in lower abd x 2 tonight.  No report of vaginal bleeding or leaking of fluid.     Past Medical History  Diagnosis Date  . Asthma   . Headache   . Gonorrhea 2011  . Chlamydia 2011  . Von Willebrand disease     Past Surgical History  Procedure Laterality Date  . Tonsillectomy    . Induced abortion      Family History  Problem Relation Age of Onset  . Anesthesia problems Neg Hx   . Cancer Mother   . Hypertension Father     History  Substance Use Topics  . Smoking status: Former Games developer  . Smokeless tobacco: Never Used  . Alcohol Use: No     Comment: 3-4 drinks occasionally    Allergies: No Known Allergies  Prescriptions prior to admission  Medication Sig Dispense Refill  . acetaminophen (TYLENOL) 500 MG tablet Take 500 mg by mouth every 6 (six) hours as needed for pain.      Marland Kitchen labetalol (NORMODYNE) 100 MG tablet Take 1 tablet (100 mg total) by mouth 2 (two) times daily.  60 tablet  3  . Prenatal Vit-Fe Fumarate-FA (PRENATAL MULTIVITAMIN) TABS Take 1 tablet by mouth daily.        Review of Systems  Constitutional: Negative for fever and chills.  HENT: Negative for hearing loss, ear pain and tinnitus.   Eyes: Negative for blurred vision and double vision.  Cardiovascular: Negative for chest pain and palpitations.  Gastrointestinal: Positive for abdominal pain (sharp pain in groin). Negative for nausea and vomiting.  Neurological: Positive for dizziness. Negative for loss of consciousness and headaches.  All other systems reviewed and are negative.   Physical Exam   Blood pressure 136/78, pulse 82, temperature 98.1 F (36.7 C), resp. rate 18,  height 5\' 4"  (1.626 m), weight 106.867 kg (235 lb 9.6 oz), last menstrual period 09/02/2012, SpO2 100.00%.  Physical Exam  Constitutional: She is oriented to person, place, and time. She appears well-developed and well-nourished. No distress.  HENT:  Head: Normocephalic.  Neck: Normal range of motion. Neck supple.  Cardiovascular: Normal rate, regular rhythm and normal heart sounds.   Respiratory: Effort normal and breath sounds normal.  GI: Soft. Bowel sounds are normal. There is no tenderness.  Genitourinary: No bleeding around the vagina. Vaginal discharge (mucusy) found.  Neurological: She is alert and oriented to person, place, and time. She displays a negative Romberg sign. Coordination and gait normal.  Skin: Skin is warm and dry.   Dilation: Closed Effacement (%): Thick Cervical Position: Posterior Exam by:: Roney Marion, CNM   MAU Course  Procedures  Results for orders placed during the hospital encounter of 12/29/12 (from the past 24 hour(s))  URINALYSIS, ROUTINE W REFLEX MICROSCOPIC     Status: Abnormal   Collection Time    12/29/12 10:30 PM      Result Value Range   Color, Urine YELLOW  YELLOW   APPearance CLEAR  CLEAR   Specific Gravity, Urine >1.030 (*) 1.005 - 1.030   pH 6.0  5.0 - 8.0   Glucose, UA NEGATIVE  NEGATIVE mg/dL   Hgb urine dipstick NEGATIVE  NEGATIVE   Bilirubin Urine NEGATIVE  NEGATIVE   Ketones, ur NEGATIVE  NEGATIVE mg/dL   Protein, ur NEGATIVE  NEGATIVE mg/dL   Urobilinogen, UA 0.2  0.0 - 1.0 mg/dL   Nitrite NEGATIVE  NEGATIVE   Leukocytes, UA TRACE (*) NEGATIVE  URINE MICROSCOPIC-ADD ON     Status: Abnormal   Collection Time    12/29/12 10:30 PM      Result Value Range   Squamous Epithelial / LPF MANY (*) RARE   WBC, UA 0-2  <3 WBC/hpf   Crystals AMORPHOUS URATES/PHOSPHATES (*) NEGATIVE   Urine-Other MUCOUS PRESENT    CBC     Status: Abnormal   Collection Time    12/30/12 12:15 AM      Result Value Range   WBC 12.6 (*) 4.0 - 10.5  K/uL   RBC 4.40  3.87 - 5.11 MIL/uL   Hemoglobin 12.1  12.0 - 15.0 g/dL   HCT 60.4 (*) 54.0 - 98.1 %   MCV 79.1  78.0 - 100.0 fL   MCH 27.5  26.0 - 34.0 pg   MCHC 34.8  30.0 - 36.0 g/dL   RDW 19.1  47.8 - 29.5 %   Platelets 203  150 - 400 K/uL  COMPREHENSIVE METABOLIC PANEL     Status: Abnormal   Collection Time    12/30/12 12:15 AM      Result Value Range   Sodium 135  135 - 145 mEq/L   Potassium 3.5  3.5 - 5.1 mEq/L   Chloride 100  96 - 112 mEq/L   CO2 24  19 - 32 mEq/L   Glucose, Bld 83  70 - 99 mg/dL   BUN 6  6 - 23 mg/dL   Creatinine, Ser 6.21 (*) 0.50 - 1.10 mg/dL   Calcium 9.4  8.4 - 30.8 mg/dL   Total Protein 6.6  6.0 - 8.3 g/dL   Albumin 3.4 (*) 3.5 - 5.2 g/dL   AST 16  0 - 37 U/L   ALT 16  0 - 35 U/L   Alkaline Phosphatase 52  39 - 117 U/L   Total Bilirubin 0.3  0.3 - 1.2 mg/dL   GFR calc non Af Amer >90  >90 mL/min   GFR calc Af Amer >90  >90 mL/min     Assessment and Plan  Dizziness in Pregnancy - Normal Exam  Plan: DC to provide Provided reassurance Explained that dizziness may be side effect of labetalol.   Reviewed pregnancy precautions  [facility] 12/29/2012, 11:49 PM

## 2012-12-29 NOTE — MAU Note (Signed)
Dizziness off and on since Weds. Night. Feels like I'm spinning. Worse since 1900. (Pt drove herself here). Cramping in lower abd x 2 tonight

## 2012-12-30 DIAGNOSIS — R42 Dizziness and giddiness: Secondary | ICD-10-CM

## 2012-12-30 LAB — COMPREHENSIVE METABOLIC PANEL
ALT: 16 U/L (ref 0–35)
AST: 16 U/L (ref 0–37)
Albumin: 3.4 g/dL — ABNORMAL LOW (ref 3.5–5.2)
Alkaline Phosphatase: 52 U/L (ref 39–117)
CO2: 24 mEq/L (ref 19–32)
Chloride: 100 mEq/L (ref 96–112)
Creatinine, Ser: 0.46 mg/dL — ABNORMAL LOW (ref 0.50–1.10)
GFR calc non Af Amer: >90 mL/min (ref >90)
Potassium: 3.5 mEq/L (ref 3.5–5.1)
Sodium: 135 mEq/L (ref 135–145)
Total Bilirubin: 0.3 mg/dL (ref 0.3–1.2)

## 2012-12-30 LAB — CBC
MCV: 79.1 fL (ref 78.0–100.0)
Platelets: 203 10*3/uL (ref 150–400)
RBC: 4.4 MIL/uL (ref 3.87–5.11)
RDW: 12.8 % (ref 11.5–15.5)
WBC: 12.6 10*3/uL — ABNORMAL HIGH (ref 4.0–10.5)

## 2012-12-30 NOTE — MAU Provider Note (Signed)
Attestation of Attending Supervision of Advanced Practitioner (PA/CNM/NP): Evaluation and management procedures were performed by the Advanced Practitioner under my supervision and collaboration.  I have reviewed the Advanced Practitioner's note and chart, and I agree with the management and plan.  Mardie Kellen, MD, FACOG Attending Obstetrician & Gynecologist Faculty Practice, [facility]  

## 2012-12-30 NOTE — Progress Notes (Signed)
Written and verbal d/c instructions given and understanding voiced. 

## 2013-01-07 ENCOUNTER — Ambulatory Visit (HOSPITAL_COMMUNITY)
Admission: RE | Admit: 2013-01-07 | Discharge: 2013-01-07 | Disposition: A | Discharge status: Home / Self Care | Payer: Medicaid Other | Source: Ambulatory Visit | Attending: Obstetrics & Gynecology | Admitting: Obstetrics & Gynecology

## 2013-01-07 DIAGNOSIS — O0990 Supervision of high risk pregnancy, unspecified, unspecified trimester: Secondary | ICD-10-CM

## 2013-01-07 DIAGNOSIS — Z3689 Encounter for other specified antenatal screening: Secondary | ICD-10-CM | POA: Insufficient documentation

## 2013-01-07 DIAGNOSIS — O10019 Pre-existing essential hypertension complicating pregnancy, unspecified trimester: Secondary | ICD-10-CM | POA: Insufficient documentation

## 2013-01-08 ENCOUNTER — Encounter: Payer: Self-pay | Admitting: Obstetrics & Gynecology

## 2013-01-10 ENCOUNTER — Ambulatory Visit (INDEPENDENT_AMBULATORY_CARE_PROVIDER_SITE_OTHER): Payer: Medicaid Other | Admitting: Obstetrics & Gynecology

## 2013-01-10 ENCOUNTER — Ambulatory Visit (HOSPITAL_COMMUNITY): Payer: Self-pay

## 2013-01-10 VITALS — BP 120/75 | Wt 239.0 lb

## 2013-01-10 DIAGNOSIS — O10019 Pre-existing essential hypertension complicating pregnancy, unspecified trimester: Secondary | ICD-10-CM

## 2013-01-10 DIAGNOSIS — O0991 Supervision of high risk pregnancy, unspecified, first trimester: Secondary | ICD-10-CM

## 2013-01-10 DIAGNOSIS — O10919 Unspecified pre-existing hypertension complicating pregnancy, unspecified trimester: Secondary | ICD-10-CM

## 2013-01-10 LAB — POCT URINALYSIS DIP (DEVICE)
Bilirubin Urine: NEGATIVE
Hgb urine dipstick: NEGATIVE
Nitrite: NEGATIVE
Protein, ur: NEGATIVE mg/dL
pH: 7 (ref 5.0–8.0)

## 2013-01-10 NOTE — Progress Notes (Signed)
Pulse: 86

## 2013-01-10 NOTE — Patient Instructions (Signed)
Pregnancy - Second Trimester The second trimester of pregnancy (3 to 6 months) is a period of rapid growth for you and your baby. At the end of the sixth month, your baby is about 9 inches long and weighs 1 1/2 pounds. You will begin to feel the baby move between 18 and 20 weeks of the pregnancy. This is called quickening. Weight gain is faster. A clear fluid (colostrum) may leak out of your breasts. You may feel small contractions of the womb (uterus). This is known as false labor or Braxton-Hicks contractions. This is like a practice for labor when the baby is ready to be born. Usually, the problems with morning sickness have usually passed by the end of your first trimester. Some women develop small dark blotches (called cholasma, mask of pregnancy) on their face that usually goes away after the baby is born. Exposure to the sun makes the blotches worse. Acne may also develop in some pregnant women and pregnant women who have acne, may find that it goes away. PRENATAL EXAMS  Blood work may continue to be done during prenatal exams. These tests are done to check on your health and the probable health of your baby. Blood work is used to follow your blood levels (hemoglobin). Anemia (low hemoglobin) is common during pregnancy. Iron and vitamins are given to help prevent this. You will also be checked for diabetes between 24 and 28 weeks of the pregnancy. Some of the previous blood tests may be repeated.  The size of the uterus is measured during each visit. This is to make sure that the baby is continuing to grow properly according to the dates of the pregnancy.  Your blood pressure is checked every prenatal visit. This is to make sure you are not getting toxemia.  Your urine is checked to make sure you do not have an infection, diabetes or protein in the urine.  Your weight is checked often to make sure gains are happening at the suggested rate. This is to ensure that both you and your baby are growing  normally.  Sometimes, an ultrasound is performed to confirm the proper growth and development of the baby. This is a test which bounces harmless sound waves off the baby so your caregiver can more accurately determine due dates. Sometimes, a specialized test is done on the amniotic fluid surrounding the baby. This test is called an amniocentesis. The amniotic fluid is obtained by sticking a needle into the belly (abdomen). This is done to check the chromosomes in instances where there is a concern about possible genetic problems with the baby. It is also sometimes done near the end of pregnancy if an early delivery is required. In this case, it is done to help make sure the baby's lungs are mature enough for the baby to live outside of the womb. CHANGES OCCURING IN THE SECOND TRIMESTER OF PREGNANCY Your body goes through many changes during pregnancy. They vary from person to person. Talk to your caregiver about changes you notice that you are concerned about.  During the second trimester, you will likely have an increase in your appetite. It is normal to have cravings for certain foods. This varies from person to person and pregnancy to pregnancy.  Your lower abdomen will begin to bulge.  You may have to urinate more often because the uterus and baby are pressing on your bladder. It is also common to get more bladder infections during pregnancy (pain with urination). You can help this by   drinking lots of fluids and emptying your bladder before and after intercourse.  You may begin to get stretch marks on your hips, abdomen, and breasts. These are normal changes in the body during pregnancy. There are no exercises or medications to take that prevent this change.  You may begin to develop swollen and bulging veins (varicose veins) in your legs. Wearing support hose, elevating your feet for 15 minutes, 3 to 4 times a day and limiting salt in your diet helps lessen the problem.  Heartburn may develop  as the uterus grows and pushes up against the stomach. Antacids recommended by your caregiver helps with this problem. Also, eating smaller meals 4 to 5 times a day helps.  Constipation can be treated with a stool softener or adding bulk to your diet. Drinking lots of fluids, vegetables, fruits, and whole grains are helpful.  Exercising is also helpful. If you have been very active up until your pregnancy, most of these activities can be continued during your pregnancy. If you have been less active, it is helpful to start an exercise program such as walking.  Hemorrhoids (varicose veins in the rectum) may develop at the end of the second trimester. Warm sitz baths and hemorrhoid cream recommended by your caregiver helps hemorrhoid problems.  Backaches may develop during this time of your pregnancy. Avoid heavy lifting, wear low heal shoes and practice good posture to help with backache problems.  Some pregnant women develop tingling and numbness of their hand and fingers because of swelling and tightening of ligaments in the wrist (carpel tunnel syndrome). This goes away after the baby is born.  As your breasts enlarge, you may have to get a bigger bra. Get a comfortable, cotton, support bra. Do not get a nursing bra until the last month of the pregnancy if you will be nursing the baby.  You may get a dark line from your belly button to the pubic area called the linea nigra.  You may develop rosy cheeks because of increase blood flow to the face.  You may develop spider looking lines of the face, neck, arms and chest. These go away after the baby is born. HOME CARE INSTRUCTIONS   It is extremely important to avoid all smoking, herbs, alcohol, and unprescribed drugs during your pregnancy. These chemicals affect the formation and growth of the baby. Avoid these chemicals throughout the pregnancy to ensure the delivery of a healthy infant.  Most of your home care instructions are the same as  suggested for the first trimester of your pregnancy. Keep your caregiver's appointments. Follow your caregiver's instructions regarding medication use, exercise and diet.  During pregnancy, you are providing food for you and your baby. Continue to eat regular, well-balanced meals. Choose foods such as meat, fish, milk and other low fat dairy products, vegetables, fruits, and whole-grain breads and cereals. Your caregiver will tell you of the ideal weight gain.  A physical sexual relationship may be continued up until near the end of pregnancy if there are no other problems. Problems could include early (premature) leaking of amniotic fluid from the membranes, vaginal bleeding, abdominal pain, or other medical or pregnancy problems.  Exercise regularly if there are no restrictions. Check with your caregiver if you are unsure of the safety of some of your exercises. The greatest weight gain will occur in the last 2 trimesters of pregnancy. Exercise will help you:  Control your weight.  Get you in shape for labor and delivery.  Lose weight   after you have the baby.  Wear a good support or jogging bra for breast tenderness during pregnancy. This may help if worn during sleep. Pads or tissues may be used in the bra if you are leaking colostrum.  Do not use hot tubs, steam rooms or saunas throughout the pregnancy.  Wear your seat belt at all times when driving. This protects you and your baby if you are in an accident.  Avoid raw meat, uncooked cheese, cat litter boxes and soil used by cats. These carry germs that can cause birth defects in the baby.  The second trimester is also a good time to visit your dentist for your dental health if this has not been done yet. Getting your teeth cleaned is OK. Use a soft toothbrush. Brush gently during pregnancy.  It is easier to loose urine during pregnancy. Tightening up and strengthening the pelvic muscles will help with this problem. Practice stopping your  urination while you are going to the bathroom. These are the same muscles you need to strengthen. It is also the muscles you would use as if you were trying to stop from passing gas. You can practice tightening these muscles up 10 times a set and repeating this about 3 times per day. Once you know what muscles to tighten up, do not perform these exercises during urination. It is more likely to contribute to an infection by backing up the urine.  Ask for help if you have financial, counseling or nutritional needs during pregnancy. Your caregiver will be able to offer counseling for these needs as well as refer you for other special needs.  Your skin may become oily. If so, wash your face with mild soap, use non-greasy moisturizer and oil or cream based makeup. MEDICATIONS AND DRUG USE IN PREGNANCY  Take prenatal vitamins as directed. The vitamin should contain 1 milligram of folic acid. Keep all vitamins out of reach of children. Only a couple vitamins or tablets containing iron may be fatal to a baby or young child when ingested.  Avoid use of all medications, including herbs, over-the-counter medications, not prescribed or suggested by your caregiver. Only take over-the-counter or prescription medicines for pain, discomfort, or fever as directed by your caregiver. Do not use aspirin.  Let your caregiver also know about herbs you may be using.  Alcohol is related to a number of birth defects. This includes fetal alcohol syndrome. All alcohol, in any form, should be avoided completely. Smoking will cause low birth rate and premature babies.  Street or illegal drugs are very harmful to the baby. They are absolutely forbidden. A baby born to an addicted mother will be addicted at birth. The baby will go through the same withdrawal an adult does. SEEK MEDICAL CARE IF:  You have any concerns or worries during your pregnancy. It is better to call with your questions if you feel they cannot wait, rather  than worry about them. SEEK IMMEDIATE MEDICAL CARE IF:   An unexplained oral temperature above 102 F (38.9 C) develops, or as your caregiver suggests.  You have leaking of fluid from the vagina (birth canal). If leaking membranes are suspected, take your temperature and tell your caregiver of this when you call.  There is vaginal spotting, bleeding, or passing clots. Tell your caregiver of the amount and how many pads are used. Light spotting in pregnancy is common, especially following intercourse.  You develop a bad smelling vaginal discharge with a change in the color from clear   to white.  You continue to feel sick to your stomach (nauseated) and have no relief from remedies suggested. You vomit blood or coffee ground-like materials.  You lose more than 2 pounds of weight or gain more than 2 pounds of weight over 1 week, or as suggested by your caregiver.  You notice swelling of your face, hands, feet, or legs.  You get exposed to German measles and have never had them.  You are exposed to fifth disease or chickenpox.  You develop belly (abdominal) pain. Round ligament discomfort is a common non-cancerous (benign) cause of abdominal pain in pregnancy. Your caregiver still must evaluate you.  You develop a bad headache that does not go away.  You develop fever, diarrhea, pain with urination, or shortness of breath.  You develop visual problems, blurry, or double vision.  You fall or are in a car accident or any kind of trauma.  There is mental or physical violence at home. Document Released: 08/09/2001 Document Revised: 11/07/2011 Document Reviewed: 02/11/2009 ExitCare Patient Information 2013 [employer].  

## 2013-01-10 NOTE — Progress Notes (Signed)
[place] 5/13 71 % ile, tiny choroid pkexus cyst, o/w nl. Declines Quad screen.

## 2013-02-07 ENCOUNTER — Ambulatory Visit (INDEPENDENT_AMBULATORY_CARE_PROVIDER_SITE_OTHER): Payer: Medicaid Other | Admitting: Family

## 2013-02-07 VITALS — BP 134/81 | Wt 241.5 lb

## 2013-02-07 DIAGNOSIS — O0992 Supervision of high risk pregnancy, unspecified, second trimester: Secondary | ICD-10-CM

## 2013-02-07 DIAGNOSIS — O10019 Pre-existing essential hypertension complicating pregnancy, unspecified trimester: Secondary | ICD-10-CM

## 2013-02-07 LAB — POCT URINALYSIS DIP (DEVICE)
Bilirubin Urine: NEGATIVE
Glucose, UA: NEGATIVE mg/dL
Ketones, ur: NEGATIVE mg/dL
Protein, ur: NEGATIVE mg/dL

## 2013-02-07 NOTE — Progress Notes (Signed)
U/S scheduled 02/22/13 at 915 am.

## 2013-02-07 NOTE — Progress Notes (Signed)
No questions or concerns; no longer taking labetalol; schedule growth [place] in two weeks

## 2013-02-07 NOTE — Progress Notes (Signed)
Pulse- 87 

## 2013-02-22 ENCOUNTER — Ambulatory Visit (HOSPITAL_COMMUNITY)
Admission: RE | Admit: 2013-02-22 | Discharge: 2013-02-22 | Disposition: A | Discharge status: Home / Self Care | Payer: Medicaid Other | Source: Ambulatory Visit | Attending: Family | Admitting: Family

## 2013-02-22 DIAGNOSIS — Z3689 Encounter for other specified antenatal screening: Secondary | ICD-10-CM | POA: Insufficient documentation

## 2013-02-22 DIAGNOSIS — O139 Gestational [pregnancy-induced] hypertension without significant proteinuria, unspecified trimester: Secondary | ICD-10-CM | POA: Insufficient documentation

## 2013-02-22 DIAGNOSIS — O0992 Supervision of high risk pregnancy, unspecified, second trimester: Secondary | ICD-10-CM

## 2013-02-24 ENCOUNTER — Encounter: Payer: Self-pay | Admitting: Family

## 2013-02-28 ENCOUNTER — Ambulatory Visit (INDEPENDENT_AMBULATORY_CARE_PROVIDER_SITE_OTHER): Payer: Medicaid Other | Admitting: Advanced Practice Midwife

## 2013-02-28 VITALS — BP 124/87 | Temp 98.7°F | Wt 245.4 lb

## 2013-02-28 DIAGNOSIS — O9989 Other specified diseases and conditions complicating pregnancy, childbirth and the puerperium: Secondary | ICD-10-CM

## 2013-02-28 DIAGNOSIS — O10019 Pre-existing essential hypertension complicating pregnancy, unspecified trimester: Secondary | ICD-10-CM

## 2013-02-28 DIAGNOSIS — N898 Other specified noninflammatory disorders of vagina: Secondary | ICD-10-CM

## 2013-02-28 DIAGNOSIS — O10912 Unspecified pre-existing hypertension complicating pregnancy, second trimester: Secondary | ICD-10-CM

## 2013-02-28 DIAGNOSIS — R319 Hematuria, unspecified: Secondary | ICD-10-CM

## 2013-02-28 LAB — POCT URINALYSIS DIP (DEVICE)
Glucose, UA: NEGATIVE mg/dL
Nitrite: NEGATIVE
Specific Gravity, Urine: 1.02 (ref 1.005–1.030)
Urobilinogen, UA: 0.2 mg/dL (ref 0.0–1.0)

## 2013-02-28 MED ORDER — TERCONAZOLE 0.4 % VA CREA: 1.0000 | TOPICAL_CREAM | Freq: Every day | VAGINAL | Status: DC

## 2013-02-28 NOTE — Progress Notes (Signed)
Normo-tentensive. No Meds. Reports vulvar swelling and itching. Clinical Dx yeast. Rx terazol. Growth [place] scheduled. 1 hour GTT and Rophylac, 28 week labs at [facility].

## 2013-02-28 NOTE — Patient Instructions (Addendum)
Monilial Vaginitis  Vaginitis in a soreness, swelling and redness (inflammation) of the vagina and vulva. Monilial vaginitis is not a sexually transmitted infection.  CAUSES   Yeast vaginitis is caused by yeast (candida) that is normally found in your vagina. With a yeast infection, the candida has overgrown in number to a point that upsets the chemical balance.  SYMPTOMS   · White, thick vaginal discharge.  · Swelling, itching, redness and irritation of the vagina and possibly the lips of the vagina (vulva).  · Burning or painful urination.  · Painful intercourse.  DIAGNOSIS   Things that may contribute to monilial vaginitis are:  · Postmenopausal and virginal states.  · Pregnancy.  · Infections.  · Being tired, sick or stressed, especially if you had monilial vaginitis in the past.  · Diabetes. Good control will help lower the chance.  · Birth control pills.  · Tight fitting garments.  · Using bubble bath, feminine sprays, douches or deodorant tampons.  · Taking certain medications that kill germs (antibiotics).  · Sporadic recurrence can occur if you become ill.  TREATMENT   Your caregiver will give you medication.  · There are several kinds of anti monilial vaginal creams and suppositories specific for monilial vaginitis. For recurrent yeast infections, use a suppository or cream in the vagina 2 times a week, or as directed.  · Anti-monilial or steroid cream for the itching or irritation of the vulva may also be used. Get your caregiver's permission.  · Painting the vagina with methylene blue solution may help if the monilial cream does not work.  · Eating yogurt may help prevent monilial vaginitis.  HOME CARE INSTRUCTIONS   · Finish all medication as prescribed.  · Do not have sex until treatment is completed or after your caregiver tells you it is okay.  · Take warm sitz baths.  · Do not douche.  · Do not use tampons, especially scented ones.  · Wear cotton underwear.  · Avoid tight pants and panty  hose.  · Tell your sexual partner that you have a yeast infection. They should go to their caregiver if they have symptoms such as mild rash or itching.  · Your sexual partner should be treated as well if your infection is difficult to eliminate.  · Practice safer sex. Use condoms.  · Some vaginal medications cause latex condoms to fail. Vaginal medications that harm condoms are:  · Cleocin cream.  · Butoconazole (Femstat®).  · Terconazole (Terazol®) vaginal suppository.  · Miconazole (Monistat®) (may be purchased over the counter).  SEEK MEDICAL CARE IF:   · You have a temperature by mouth above 102° F (38.9° C).  · The infection is getting worse after 2 days of treatment.  · The infection is not getting better after 3 days of treatment.  · You develop blisters in or around your vagina.  · You develop vaginal bleeding, and it is not your menstrual period.  · You have pain when you urinate.  · You develop intestinal problems.  · You have pain with sexual intercourse.  Document Released: 05/25/2005 Document Revised: 11/07/2011 Document Reviewed: 02/06/2009  ExitCare® Patient Information ©2014 [employer].

## 2013-02-28 NOTE — Progress Notes (Signed)
Pulse- 90 Patient reports vaginal irritation, itching, swelling and increase in d/c

## 2013-03-01 LAB — WET PREP, GENITAL: Trich, Wet Prep: NONE SEEN

## 2013-03-02 LAB — CULTURE, OB URINE: Colony Count: 80000

## 2013-03-05 ENCOUNTER — Encounter: Payer: Self-pay | Admitting: Advanced Practice Midwife

## 2013-03-05 DIAGNOSIS — O350XX0 Maternal care for (suspected) central nervous system malformation in fetus, not applicable or unspecified: Secondary | ICD-10-CM | POA: Insufficient documentation

## 2013-03-05 DIAGNOSIS — O3503X0 Maternal care for (suspected) central nervous system malformation or damage in fetus, choroid plexus cysts, not applicable or unspecified: Secondary | ICD-10-CM | POA: Insufficient documentation

## 2013-03-07 ENCOUNTER — Inpatient Hospital Stay (HOSPITAL_COMMUNITY)
Admission: AD | Admit: 2013-03-07 | Discharge: 2013-03-07 | Disposition: A | Discharge status: Home / Self Care | Payer: Medicaid Other | Source: Ambulatory Visit | Attending: Obstetrics & Gynecology | Admitting: Obstetrics & Gynecology

## 2013-03-07 ENCOUNTER — Encounter (HOSPITAL_COMMUNITY): Payer: Self-pay

## 2013-03-07 DIAGNOSIS — O368190 Decreased fetal movements, unspecified trimester, not applicable or unspecified: Secondary | ICD-10-CM | POA: Insufficient documentation

## 2013-03-07 DIAGNOSIS — O368120 Decreased fetal movements, second trimester, not applicable or unspecified: Secondary | ICD-10-CM

## 2013-03-07 LAB — URINALYSIS, ROUTINE W REFLEX MICROSCOPIC
Glucose, UA: NEGATIVE mg/dL
Leukocytes, UA: NEGATIVE
Specific Gravity, Urine: >1.03 — ABNORMAL HIGH (ref 1.005–1.030)
pH: 6 (ref 5.0–8.0)

## 2013-03-07 NOTE — MAU Provider Note (Signed)
  History     CSN: [id number]  Arrival date and time: 03/07/13 2017   None     Chief Complaint  Patient presents with  . Decreased Fetal Movement   HPI 21 y.o. G2P0010 at [redacted]w[redacted]d who reports no fetal movement since yesterday at 2 pm. Still not feeling movements even after being reassured of fetal well-being and movement on the monitor. No contractions, bleeding, no loss of fluid. Has felt baby move consistently for several weeks, so was very worried.   Receives care at [facility] for chronic hypertension. Was on labetalol but stopped it, as blood pressure was controlled. Normal ultrasounds and labs with anterior placenta. Next appt 7/31. Ultrasound with MFM for growth on 7/21.   OB History   Grav Para Term Preterm Abortions TAB SAB Ect Mult Living   2 0 0 0 1 1 0 0 0 0       Past Medical History  Diagnosis Date  . Asthma   . Headache(784.0)   . Gonorrhea 2011  . Chlamydia 2011  . Von Willebrand disease     Past Surgical History  Procedure Laterality Date  . Tonsillectomy    . Induced abortion      Family History  Problem Relation Age of Onset  . Anesthesia problems Neg Hx   . Cancer Mother   . Hypertension Father     History  Substance Use Topics  . Smoking status: Former Games developer  . Smokeless tobacco: Never Used  . Alcohol Use: No     Comment: 3-4 drinks occasionally    Allergies: No Known Allergies  Prescriptions prior to admission  Medication Sig Dispense Refill  . Prenatal Vit-Fe Fumarate-FA (PRENATAL MULTIVITAMIN) TABS Take 1 tablet by mouth daily.      Marland Kitchen terconazole (TERAZOL 7) 0.4 % vaginal cream Place 1 applicator vaginally at bedtime.  45 g  0    ROS see HPI  Physical Exam   Blood pressure 135/69, pulse 94, temperature 97 F (36.1 C), temperature source Oral, resp. rate 18, last menstrual period 09/02/2012, SpO2 100.00%.  Filed Vitals:   03/07/13 2040  BP: 135/69  Pulse: 94  Temp: 97 F (36.1 C)  TempSrc: Oral  Resp: 18  SpO2: 100%   GU:   Deferred   Physical Exam  GEN:  WNWD, no distress HEENT:  NCAT, EOMI, conjunctiva clear CV: RRR, no murmur RESP:  CTAB ABD:  Soft, non-tender, no guarding or rebound, normal bowel sounds EXTREM:  Warm, well perfused, no edema or tenderness NEURO:  Alert, oriented, no focal deficits  MAU Course  Procedures  FHTs:  135, mod var, accels present, no decels TOCO: no ctx  Assessment and Plan  22 y.o. G2P0010 at [redacted]w[redacted]d not feeling fetal movement for 2 days. -FHTs reassuring, movement heard on monitor - Anterior placenta, obese body habitus - reassured patient - Discussed expectations for movement, kick counts - F/u as scheduled, stable for discharge home  Napoleon Form 03/07/2013, 9:34 PM

## 2013-03-07 NOTE — MAU Note (Signed)
Pt states she has not felt baby move since 2pm yesterday afternoon. States some cramping, but denies vaginal bleeding and d/c. Denies LOF.

## 2013-03-18 ENCOUNTER — Ambulatory Visit (HOSPITAL_COMMUNITY)
Admission: RE | Admit: 2013-03-18 | Discharge: 2013-03-18 | Disposition: A | Discharge status: Home / Self Care | Payer: Medicaid Other | Source: Ambulatory Visit | Attending: Advanced Practice Midwife | Admitting: Advanced Practice Midwife

## 2013-03-18 DIAGNOSIS — O10912 Unspecified pre-existing hypertension complicating pregnancy, second trimester: Secondary | ICD-10-CM

## 2013-03-18 DIAGNOSIS — D689 Coagulation defect, unspecified: Secondary | ICD-10-CM | POA: Insufficient documentation

## 2013-03-18 DIAGNOSIS — D6800 Von Willebrand disease, unspecified: Secondary | ICD-10-CM | POA: Insufficient documentation

## 2013-03-18 DIAGNOSIS — O139 Gestational [pregnancy-induced] hypertension without significant proteinuria, unspecified trimester: Secondary | ICD-10-CM | POA: Insufficient documentation

## 2013-03-21 ENCOUNTER — Ambulatory Visit (HOSPITAL_COMMUNITY): Payer: Medicaid Other

## 2013-03-28 ENCOUNTER — Ambulatory Visit (INDEPENDENT_AMBULATORY_CARE_PROVIDER_SITE_OTHER): Payer: Medicaid Other | Admitting: Advanced Practice Midwife

## 2013-03-28 VITALS — BP 117/71 | Wt 244.2 lb

## 2013-03-28 DIAGNOSIS — O10019 Pre-existing essential hypertension complicating pregnancy, unspecified trimester: Secondary | ICD-10-CM

## 2013-03-28 DIAGNOSIS — O360190 Maternal care for anti-D [Rh] antibodies, unspecified trimester, not applicable or unspecified: Secondary | ICD-10-CM

## 2013-03-28 DIAGNOSIS — O10912 Unspecified pre-existing hypertension complicating pregnancy, second trimester: Secondary | ICD-10-CM

## 2013-03-28 DIAGNOSIS — D680 Von Willebrand's disease: Secondary | ICD-10-CM

## 2013-03-28 DIAGNOSIS — O360990 Maternal care for other rhesus isoimmunization, unspecified trimester, not applicable or unspecified: Secondary | ICD-10-CM

## 2013-03-28 LAB — POCT URINALYSIS DIP (DEVICE)
Bilirubin Urine: NEGATIVE
Glucose, UA: NEGATIVE mg/dL
Ketones, ur: NEGATIVE mg/dL
Specific Gravity, Urine: 1.02 (ref 1.005–1.030)

## 2013-03-28 LAB — CBC
HCT: 34.5 % — ABNORMAL LOW (ref 36.0–46.0)
Hemoglobin: 12 g/dL (ref 12.0–15.0)
WBC: 11.1 10*3/uL — ABNORMAL HIGH (ref 4.0–10.5)

## 2013-03-28 MED ORDER — RHO D IMMUNE GLOBULIN 1500 UNIT/2ML IJ SOLN
300.0000 ug | Freq: Once | INTRAMUSCULAR | Status: AC
  Administered 2013-03-28: 300 ug via INTRAMUSCULAR

## 2013-03-28 NOTE — Progress Notes (Signed)
Doing well.  Good fetal movement most days, sometimes has decreased movement when active and busy but more movement when lying down, denies vaginal bleeding, LOF, regular contractions.  Glucose screen and rhophylac today.

## 2013-03-28 NOTE — Addendum Note (Signed)
Addended by: Franchot Mimes on: 03/28/2013 10:33 AM   Modules accepted: Orders

## 2013-03-28 NOTE — Progress Notes (Signed)
P=83. States baby moves less somedays than others- then somedays moves a lot. States went to MAU a few weeks ago because baby not moving good and they said was ok,

## 2013-03-29 LAB — ANTIBODY SCREEN: Antibody Screen: NEGATIVE

## 2013-03-29 LAB — RPR

## 2013-03-29 LAB — HIV ANTIBODY (ROUTINE TESTING W REFLEX): HIV: NONREACTIVE

## 2013-03-29 LAB — GLUCOSE TOLERANCE, 1 HOUR (50G) W/O FASTING: Glucose, 1 Hour GTT: 87 mg/dL (ref 70–140)

## 2013-03-30 ENCOUNTER — Encounter: Payer: Self-pay | Admitting: Advanced Practice Midwife

## 2013-04-08 ENCOUNTER — Encounter: Payer: Self-pay | Admitting: *Deleted

## 2013-04-11 ENCOUNTER — Ambulatory Visit (INDEPENDENT_AMBULATORY_CARE_PROVIDER_SITE_OTHER): Payer: Medicaid Other | Admitting: Advanced Practice Midwife

## 2013-04-11 VITALS — BP 114/77 | Wt 246.2 lb

## 2013-04-11 DIAGNOSIS — O10913 Unspecified pre-existing hypertension complicating pregnancy, third trimester: Secondary | ICD-10-CM

## 2013-04-11 DIAGNOSIS — O10019 Pre-existing essential hypertension complicating pregnancy, unspecified trimester: Secondary | ICD-10-CM

## 2013-04-11 LAB — POCT URINALYSIS DIP (DEVICE)
Bilirubin Urine: NEGATIVE
Ketones, ur: NEGATIVE mg/dL
Nitrite: NEGATIVE
Protein, ur: NEGATIVE mg/dL

## 2013-04-11 NOTE — Progress Notes (Signed)
Pulse: 83

## 2013-04-11 NOTE — Progress Notes (Signed)
Doing well.  Good fetal movement, denies vaginal bleeding, LOF, regular contractions.  

## 2013-04-16 ENCOUNTER — Inpatient Hospital Stay (HOSPITAL_COMMUNITY)
Admission: AD | Admit: 2013-04-16 | Discharge: 2013-04-16 | Disposition: A | Discharge status: Home / Self Care | Payer: Medicaid Other | Source: Ambulatory Visit | Attending: Obstetrics & Gynecology | Admitting: Obstetrics & Gynecology

## 2013-04-16 ENCOUNTER — Encounter (HOSPITAL_COMMUNITY): Payer: Self-pay | Admitting: *Deleted

## 2013-04-16 DIAGNOSIS — D689 Coagulation defect, unspecified: Secondary | ICD-10-CM | POA: Insufficient documentation

## 2013-04-16 DIAGNOSIS — D6800 Von Willebrand disease, unspecified: Secondary | ICD-10-CM | POA: Insufficient documentation

## 2013-04-16 DIAGNOSIS — O368190 Decreased fetal movements, unspecified trimester, not applicable or unspecified: Secondary | ICD-10-CM | POA: Insufficient documentation

## 2013-04-16 NOTE — MAU Provider Note (Signed)
Attestation of Attending Supervision of Advanced Practitioner (PA/CNM/NP): Evaluation and management procedures were performed by the Advanced Practitioner under my supervision and collaboration.  I have reviewed the Advanced Practitioner's note and chart, and I agree with the management and plan.  Tishawn Friedhoff, MD, FACOG Attending Obstetrician & Gynecologist Faculty Practice, [facility]  

## 2013-04-16 NOTE — MAU Note (Signed)
Patient states she has only felt one fetal movement today when she pushed on the baby. Had less than usual movement since yesterday. Denies bleeding, leaking or contractions.

## 2013-04-16 NOTE — MAU Provider Note (Signed)
Chief Complaint:  Decreased Fetal Movement   None     HPI: Carly Rush is a 22 y.o. G2P0010 at 106w2d who presents to maternity admissions reporting decreased FM, feeling only elicited movement all day. Since on EFM she is perceiving movement.. Denies contractions, leakage of fluid or vaginal bleeding. Good fetal movement.   Pregnancy Course: essentially uncomplicated. Obese. Normal 1 hr GCT x 2.   Past Medical History: Past Medical History  Diagnosis Date  . Asthma   . Headache(784.0)   . Gonorrhea 2011  . Chlamydia 2011  . Von Willebrand disease     Past obstetric history: OB History  Gravida Para Term Preterm AB SAB TAB Ectopic Multiple Living  2 0 0 0 1 0 1 0 0 0     # Outcome Date GA Lbr Len/2nd Weight Sex Delivery Anes PTL Lv  2 CUR           1 TAB               Past Surgical History: Past Surgical History  Procedure Laterality Date  . Tonsillectomy    . Induced abortion       Family History: Family History  Problem Relation Age of Onset  . Anesthesia problems Neg Hx   . Cancer Mother   . Hypertension Father     Social History: History  Substance Use Topics  . Smoking status: Former Games developer  . Smokeless tobacco: Never Used  . Alcohol Use: No     Comment: 3-4 drinks occasionally    Allergies: No Known Allergies  Meds:  Prescriptions prior to admission  Medication Sig Dispense Refill  . Prenatal Vit-Fe Fumarate-FA (PRENATAL MULTIVITAMIN) TABS Take 1 tablet by mouth daily.        ROS: Pertinent findings in history of present illness.  Physical Exam  Blood pressure 112/79, pulse 98, temperature 98.2 F (36.8 C), temperature source Oral, resp. rate 18, height 5' 4.5" (1.638 m), weight 112.583 kg (248 lb 3.2 oz), last menstrual period 09/02/2012, SpO2 98.00%. GENERAL: Well-developed, well-nourished female in no acute distress.  HEENT: normocephalic HEART: normal rate RESP: normal effort ABDOMEN: Soft, non-tender, gravid appropriate for gestational  age EXTREMITIES: Nontender, no edema NEURO: alert and oriented  FHT:  Baseline 135-140 , moderate variability, accelerations present, no decelerations Contractions: none   Labs: No results found for this or any previous visit (from the past 24 hour(s)).  Imaging:  US Ob Follow Up  03/18/2013   OBSTETRICAL ULTRASOUND: This exam was performed within a [facility]. The OB US report was generated in the [facility] system, and faxed to the ordering physician.   This report is also available in [employer] and in the [employer]. See [facility] Obstetric US report.  MAU Course: EFM x 40 min> reactive with frequent audible FM Bedside US by me: cephalic, Gr 1 placenta, normal AFV, fetus active  Assessment: 1. Decreased fetal movement in pregnancy, delivered, third trimester, not applicable or unspecified fetus   G2P0010 at [redacted]w[redacted]d  Fetus well by category 1 FHR tracing and normal AFV  Plan: Discharge home AVS fetal kick counts     Medication List         prenatal multivitamin Tabs tablet  Take 1 tablet by mouth daily.         Danae Orleans, CNM 04/16/2013 7:07 PM

## 2013-04-16 NOTE — MAU Note (Signed)
Fetal heart tones in triage in the 125-140.

## 2013-04-25 ENCOUNTER — Other Ambulatory Visit: Payer: Self-pay | Admitting: Family Medicine

## 2013-04-25 ENCOUNTER — Ambulatory Visit (INDEPENDENT_AMBULATORY_CARE_PROVIDER_SITE_OTHER): Payer: Medicaid Other | Admitting: Family Medicine

## 2013-04-25 VITALS — BP 122/76 | Temp 97.3°F | Wt 248.5 lb

## 2013-04-25 DIAGNOSIS — O350XX0 Maternal care for (suspected) central nervous system malformation in fetus, not applicable or unspecified: Secondary | ICD-10-CM

## 2013-04-25 DIAGNOSIS — D680 Von Willebrand's disease: Secondary | ICD-10-CM

## 2013-04-25 DIAGNOSIS — O10919 Unspecified pre-existing hypertension complicating pregnancy, unspecified trimester: Secondary | ICD-10-CM

## 2013-04-25 DIAGNOSIS — O360131 Maternal care for anti-D [Rh] antibodies, third trimester, fetus 1: Secondary | ICD-10-CM

## 2013-04-25 DIAGNOSIS — O10019 Pre-existing essential hypertension complicating pregnancy, unspecified trimester: Secondary | ICD-10-CM

## 2013-04-25 DIAGNOSIS — O360191 Maternal care for anti-D [Rh] antibodies, unspecified trimester, fetus 1: Secondary | ICD-10-CM

## 2013-04-25 DIAGNOSIS — A6000 Herpesviral infection of urogenital system, unspecified: Secondary | ICD-10-CM

## 2013-04-25 DIAGNOSIS — O360990 Maternal care for other rhesus isoimmunization, unspecified trimester, not applicable or unspecified: Secondary | ICD-10-CM

## 2013-04-25 DIAGNOSIS — Z23 Encounter for immunization: Secondary | ICD-10-CM

## 2013-04-25 LAB — POCT URINALYSIS DIP (DEVICE)
Glucose, UA: NEGATIVE mg/dL
Ketones, ur: NEGATIVE mg/dL
Leukocytes, UA: NEGATIVE
Protein, ur: 30 mg/dL — AB
Specific Gravity, Urine: >=1.03 (ref 1.005–1.030)
Urobilinogen, UA: 0.2 mg/dL (ref 0.0–1.0)

## 2013-04-25 MED ORDER — DESMOPRESSIN ACETATE 1.5 MG/ML NA SOLN: 2.0000 | Freq: Every day | NASAL | Status: DC

## 2013-04-25 MED ORDER — TETANUS-DIPHTH-ACELL PERTUSSIS 5-2.5-18.5 LF-MCG/0.5 IM SUSP
0.5000 mL | Freq: Once | INTRAMUSCULAR | Status: AC
  Administered 2013-04-25: 0.5 mL via INTRAMUSCULAR

## 2013-04-25 MED ORDER — VALACYCLOVIR HCL 1 G PO TABS: 500.0000 mg | ORAL_TABLET | Freq: Two times a day (BID) | ORAL | Status: DC

## 2013-04-25 NOTE — Progress Notes (Signed)
Pulse- 93  Pain-in front of stomach

## 2013-04-25 NOTE — Patient Instructions (Signed)
Pregnancy - Third Trimester The third trimester of pregnancy (the last 3 months) is a period of the most rapid growth for you and your baby. The baby approaches a length of 20 inches and a weight of 6 to 10 pounds. The baby is adding on fat and getting ready for life outside your body. While inside, babies have periods of sleeping and waking, sucking thumbs, and hiccuping. You can often feel small contractions of the uterus. This is false labor. It is also called Braxton-Hicks contractions. This is like a practice for labor. The usual problems in this stage of pregnancy include more difficulty breathing, swelling of the hands and feet from water retention, and having to urinate more often because of the uterus and baby pressing on your bladder.  PRENATAL EXAMS  Blood work may continue to be done during prenatal exams. These tests are done to check on your health and the probable health of your baby. Blood work is used to follow your blood levels (hemoglobin). Anemia (low hemoglobin) is common during pregnancy. Iron and vitamins are given to help prevent this. You may also continue to be checked for diabetes. Some of the past blood tests may be done again.  The size of the uterus is measured during each visit. This makes sure your baby is growing properly according to your pregnancy dates.  Your blood pressure is checked every prenatal visit. This is to make sure you are not getting toxemia.  Your urine is checked every prenatal visit for infection, diabetes, and protein.  Your weight is checked at each visit. This is done to make sure gains are happening at the suggested rate and that you and your baby are growing normally.  Sometimes, an ultrasound is performed to confirm the position and the proper growth and development of the baby. This is a test done that bounces harmless sound waves off the baby so your caregiver can more accurately determine a due date.  Discuss the type of pain medicine and  anesthesia you will have during your labor and delivery.  Discuss the possibility and anesthesia if a cesarean section might be necessary.  Inform your caregiver if there is any mental or physical violence at home. Sometimes, a specialized non-stress test, contraction stress test, and biophysical profile are done to make sure the baby is not having a problem. Checking the amniotic fluid surrounding the baby is called an amniocentesis. The amniotic fluid is removed by sticking a needle into the belly (abdomen). This is sometimes done near the end of pregnancy if an early delivery is required. In this case, it is done to help make sure the baby's lungs are mature enough for the baby to live outside of the womb. If the lungs are not mature and it is unsafe to deliver the baby, an injection of cortisone medicine is given to the mother 1 to 2 days before the delivery. This helps the baby's lungs mature and makes it safer to deliver the baby. CHANGES OCCURING IN THE THIRD TRIMESTER OF PREGNANCY Your body goes through many changes during pregnancy. They vary from person to person. Talk to your caregiver about changes you notice and are concerned about.  During the last trimester, you have probably had an increase in your appetite. It is normal to have cravings for certain foods. This varies from person to person and pregnancy to pregnancy.  You may begin to get stretch marks on your hips, abdomen, and breasts. These are normal changes in the body   during pregnancy. There are no exercises or medicines to take which prevent this change.  Constipation may be treated with a stool softener or adding bulk to your diet. Drinking lots of fluids, fiber in vegetables, fruits, and whole grains are helpful.  Exercising is also helpful. If you have been very active up until your pregnancy, most of these activities can be continued during your pregnancy. If you have been less active, it is helpful to start an exercise  program such as walking. Consult your caregiver before starting exercise programs.  Avoid all smoking, alcohol, non-prescribed drugs, herbs and "street drugs" during your pregnancy. These chemicals affect the formation and growth of the baby. Avoid chemicals throughout the pregnancy to ensure the delivery of a healthy infant.  Backache, varicose veins, and hemorrhoids may develop or get worse.  You will tire more easily in the third trimester, which is normal.  The baby's movements may be stronger and more often.  You may become short of breath easily.  Your belly button may stick out.  A yellow discharge may leak from your breasts called colostrum.  You may have a bloody mucus discharge. This usually occurs a few days to a week before labor begins. HOME CARE INSTRUCTIONS   Keep your caregiver's appointments. Follow your caregiver's instructions regarding medicine use, exercise, and diet.  During pregnancy, you are providing food for you and your baby. Continue to eat regular, well-balanced meals. Choose foods such as meat, fish, milk and other low fat dairy products, vegetables, fruits, and whole-grain breads and cereals. Your caregiver will tell you of the ideal weight gain.  A physical sexual relationship may be continued throughout pregnancy if there are no other problems such as early (premature) leaking of amniotic fluid from the membranes, vaginal bleeding, or belly (abdominal) pain.  Exercise regularly if there are no restrictions. Check with your caregiver if you are unsure of the safety of your exercises. Greater weight gain will occur in the last 2 trimesters of pregnancy. Exercising helps:  Control your weight.  Get you in shape for labor and delivery.  You lose weight after you deliver.  Rest a lot with legs elevated, or as needed for leg cramps or low back pain.  Wear a good support or jogging bra for breast tenderness during pregnancy. This may help if worn during  sleep. Pads or tissues may be used in the bra if you are leaking colostrum.  Do not use hot tubs, steam rooms, or saunas.  Wear your seat belt when driving. This protects you and your baby if you are in an accident.  Avoid raw meat, cat litter boxes and soil used by cats. These carry germs that can cause birth defects in the baby.  It is easier to leak urine during pregnancy. Tightening up and strengthening the pelvic muscles will help with this problem. You can practice stopping your urination while you are going to the bathroom. These are the same muscles you need to strengthen. It is also the muscles you would use if you were trying to stop from passing gas. You can practice tightening these muscles up 10 times a set and repeating this about 3 times per day. Once you know what muscles to tighten up, do not perform these exercises during urination. It is more likely to cause an infection by backing up the urine.  Ask for help if you have financial, counseling, or nutritional needs during pregnancy. Your caregiver will be able to offer counseling for these   needs as well as refer you for other special needs.  Make a list of emergency phone numbers and have them available.  Plan on getting help from family or friends when you go home from the hospital.  Make a trial run to the hospital.  Take prenatal classes with the father to understand, practice, and ask questions about the labor and delivery.  Prepare the baby's room or nursery.  Do not travel out of the city unless it is absolutely necessary and with the advice of your caregiver.  Wear only low or no heal shoes to have better balance and prevent falling. MEDICINES AND DRUG USE IN PREGNANCY  Take prenatal vitamins as directed. The vitamin should contain 1 milligram of folic acid. Keep all vitamins out of reach of children. Only a couple vitamins or tablets containing iron may be fatal to a baby or young child when ingested.  Avoid use  of all medicines, including herbs, over-the-counter medicines, not prescribed or suggested by your caregiver. Only take over-the-counter or prescription medicines for pain, discomfort, or fever as directed by your caregiver. Do not use aspirin, ibuprofen or naproxen unless approved by your caregiver.  Let your caregiver also know about herbs you may be using.  Alcohol is related to a number of birth defects. This includes fetal alcohol syndrome. All alcohol, in any form, should be avoided completely. Smoking will cause low birth rate and premature babies.  Illegal drugs are very harmful to the baby. They are absolutely forbidden. A baby born to an addicted mother will be addicted at birth. The baby will go through the same withdrawal an adult does. SEEK MEDICAL CARE IF: You have any concerns or worries during your pregnancy. It is better to call with your questions if you feel they cannot wait, rather than worry about them. SEEK IMMEDIATE MEDICAL CARE IF:   An unexplained oral temperature above 102 F (38.9 C) develops, or as your caregiver suggests.  You have leaking of fluid from the vagina. If leaking membranes are suspected, take your temperature and tell your caregiver of this when you call.  There is vaginal spotting, bleeding or passing clots. Tell your caregiver of the amount and how many pads are used.  You develop a bad smelling vaginal discharge with a change in the color from clear to white.  You develop vomiting that lasts more than 24 hours.  You develop chills or fever.  You develop shortness of breath.  You develop burning on urination.  You loose more than 2 pounds of weight or gain more than 2 pounds of weight or as suggested by your caregiver.  You notice sudden swelling of your face, hands, and feet or legs.  You develop belly (abdominal) pain. Round ligament discomfort is a common non-cancerous (benign) cause of abdominal pain in pregnancy. Your caregiver still  must evaluate you.  You develop a severe headache that does not go away.  You develop visual problems, blurred or double vision.  If you have not felt your baby move for more than 1 hour. If you think the baby is not moving as much as usual, eat something with sugar in it and lie down on your left side for an hour. The baby should move at least 4 to 5 times per hour. Call right away if your baby moves less than that.  You fall, are in a car accident, or any kind of trauma.  There is mental or physical violence at home. Document Released: 08/09/2001   Document Revised: 05/09/2012 Document Reviewed: 02/11/2009 [employer] Patient Information 2014 [employer]  Breastfeeding A change in hormones during your pregnancy causes growth of your breast tissue and an increase in number and size of milk ducts. The hormone prolactin allows proteins, sugars, and fats from your blood supply to make breast milk in your milk-producing glands. The hormone progesterone prevents breast milk from being released before the birth of your baby. After the birth of your baby, your progesterone level decreases allowing breast milk to be released. Thoughts of your baby, as well as his or her sucking or crying, can stimulate the release of milk from the milk-producing glands. Deciding to breastfeed (nurse) is one of the best choices you can make for you and your baby. The information that follows gives a brief review of the benefits, as well as other important skills to know about breastfeeding. BENEFITS OF BREASTFEEDING For your baby  The first milk (colostrum) helps your baby's digestive system function better.   There are antibodies in your milk that help your baby fight off infections.   Your baby has a lower incidence of asthma, allergies, and sudden infant death syndrome (SIDS).   The nutrients in breast milk are better for your baby than infant formulas.  Breast milk improves your baby's brain development.    Your baby will have less gas, colic, and constipation.  Your baby is less likely to develop other conditions, such as childhood obesity, asthma, or diabetes mellitus. For you  Breastfeeding helps develop a very special bond between you and your baby.   Breastfeeding is convenient, always available at the correct temperature, and costs nothing.   Breastfeeding helps to burn calories and helps you lose the weight gained during pregnancy.   Breastfeeding makes your uterus contract back down to normal size faster and slows bleeding following delivery.   Breastfeeding mothers have a lower risk of developing osteoporosis or breast or ovarian cancer later in life.  BREASTFEEDING FREQUENCY  A healthy, full-term baby may breastfeed as often as every hour or space his or her feedings to every 3 hours. Breastfeeding frequency will vary from baby to baby.   Newborns should be fed no less than every 2 3 hours during the day and every 4 5 hours during the night. You should breastfeed a minimum of 8 feedings in a 24 hour period.  Awaken your baby to breastfeed if it has been 3 4 hours since the last feeding.  Breastfeed when you feel the need to reduce the fullness of your breasts or when your newborn shows signs of hunger. Signs that your baby may be hungry include:  Increased alertness or activity.  Stretching.  Movement of the head from side to side.  Movement of the head and opening of the mouth when the corner of the mouth or cheek is stroked (rooting).  Increased sucking sounds, smacking lips, cooing, sighing, or squeaking.  Hand-to-mouth movements.  Increased sucking of fingers or hands.  Fussing.  Intermittent crying.  Signs of extreme hunger will require calming and consoling before you try to feed your baby. Signs of extreme hunger may include:  Restlessness.  A loud, strong cry.  Screaming.  Frequent feeding will help you make more milk and will help prevent  problems, such as sore nipples and engorgement of the breasts.  BREASTFEEDING   Whether lying down or sitting, be sure that the baby's abdomen is facing your abdomen.   Support your breast with 4 fingers under your breast   and your thumb above your nipple. Make sure your fingers are well away from your nipple and your baby's mouth.   Stroke your baby's lips gently with your finger or nipple.   When your baby's mouth is open wide enough, place all of your nipple and as much of the colored area around your nipple (areola) as possible into your baby's mouth.  More areola should be visible above his or her upper lip than below his or her lower lip.  Your baby's tongue should be between his or her lower gum and your breast.  Ensure that your baby's mouth is correctly positioned around the nipple (latched). Your baby's lips should create a seal on your breast.  Signs that your baby has effectively latched onto your nipple include:  Tugging or sucking without pain.  Swallowing heard between sucks.  Absent click or smacking sound.  Muscle movement above and in front of his or her ears with sucking.  Your baby must suck about 2 3 minutes in order to get your milk. Allow your baby to feed on each breast as long as he or she wants. Nurse your baby until he or she unlatches or falls asleep at the first breast, then offer the second breast.  Signs that your baby is full and satisfied include:  A gradual decrease in the number of sucks or complete cessation of sucking.  Falling asleep.  Extension or relaxation of his or her body.  Retention of a small amount of milk in his or her mouth.  Letting go of your breast by himself or herself.  Signs of effective breastfeeding in you include:  Breasts that have increased firmness, weight, and size prior to feeding.  Breasts that are softer after nursing.  Increased milk volume, as well as a change in milk consistency and color by the 5th  day of breastfeeding.  Breast fullness relieved by breastfeeding.  Nipples are not sore, cracked, or bleeding.  If needed, break the suction by putting your finger into the corner of your baby's mouth and sliding your finger between his or her gums. Then, remove your breast from his or her mouth.  It is common for babies to spit up a small amount after a feeding.  Babies often swallow air during feeding. This can make babies fussy. Burping your baby between breasts can help with this.  Vitamin D supplements are recommended for babies who get only breast milk.  Avoid using a pacifier during your baby's first 4 6 weeks.  Avoid supplemental feedings of water, formula, or juice in place of breastfeeding. Breast milk is all the food your baby needs. It is not necessary for your baby to have water or formula. Your breasts will make more milk if supplemental feedings are avoided during the early weeks. HOW TO TELL WHETHER YOUR BABY IS GETTING ENOUGH BREAST MILK Wondering whether or not your baby is getting enough milk is a common concern among mothers. You can be assured that your baby is getting enough milk if:   Your baby is actively sucking and you hear swallowing.   Your baby seems relaxed and satisfied after a feeding.   Your baby nurses at least 8 12 times in a 24 hour time period.  During the first 3 5 days of age:  Your baby is wetting at least 3 5 diapers in a 24 hour period. The urine should be clear and pale yellow.  Your baby is having at least 3 4 stools in   a 24 hour period. The stool should be soft and yellow.  At 5 7 days of age, your baby is having at least 3 6 stools in a 24 hour period. The stool should be seedy and yellow by 5 days of age.  Your baby has a weight loss less than 7 10% during the first 3 days of age.  Your baby does not lose weight after 3 7 days of age.  Your baby gains 4 7 ounces each week after he or she is 4 days of age.  Your baby gains weight  by 5 days of age and is back to birth weight within 2 weeks. ENGORGEMENT In the first week after your baby is born, you may experience extremely full breasts (engorgement). When engorged, your breasts may feel heavy, warm, or tender to the touch. Engorgement peaks within 24 48 hours after delivery of your baby.  Engorgement may be reduced by:  Continuing to breastfeed.  Increasing the frequency of breastfeeding.  Taking warm showers or applying warm, moist heat to your breasts just before each feeding. This increases circulation and helps the milk flow.   Gently massaging your breast before and during the feedings. With your fingertips, massage from your chest wall towards your nipple in a circular motion.   Ensuring that your baby empties at least one breast at every feeding. It also helps to start the next feeding on the opposite breast.   Expressing breast milk by hand or by using a breast pump to empty the breasts if your baby is sleepy, or not nursing well. You may also want to express milk if you are returning to work oryou feel you are getting engorged.  Ensuring your baby is latched on and positioned properly while breastfeeding. If you follow these suggestions, your engorgement should improve in 24 48 hours. If you are still experiencing difficulty, call your lactation consultant or caregiver.  CARING FOR YOURSELF Take care of your breasts.  Bathe or shower daily.   Avoid using soap on your nipples.   Wear a supportive bra. Avoid wearing underwire style bras.  Air dry your nipples for a 3 4minutes after each feeding.   Use only cotton bra pads to absorb breast milk leakage. Leaking of breast milk between feedings is normal.   Use only pure lanolin on your nipples after nursing. You do not need to wash it off before feeding your baby again. Another option is to express a few drops of breast milk and gently massage that milk into your nipples.  Continue breast  self-awareness checks. Take care of yourself.  Eat healthy foods. Alternate 3 meals with 3 snacks.  Avoid foods that you notice affect your baby in a bad way.  Drink milk, fruit juice, and water to satisfy your thirst (about 8 glasses a day).   Rest often, relax, and take your prenatal vitamins to prevent fatigue, stress, and anemia.  Avoid chewing and smoking tobacco.  Avoid alcohol and drug use.  Take over-the-counter and prescribed medicine only as directed by your caregiver or pharmacist. You should always check with your caregiver or pharmacist before taking any new medicine, vitamin, or herbal supplement.  Know that pregnancy is possible while breastfeeding. If desired, talk to your caregiver about family planning and safe birth control methods that may be used while breastfeeding. SEEK MEDICAL CARE IF:   You feel like you want to stop breastfeeding or have become frustrated with breastfeeding.  You have painful breasts or nipples.    Your nipples are cracked or bleeding.  Your breasts are red, tender, or warm.  You have a swollen area on either breast.  You have a fever or chills.  You have nausea or vomiting.  You have drainage from your nipples.  Your breasts do not become full before feedings by the 5th day after delivery.  You feel sad and depressed.  Your baby is too sleepy to eat well.  Your baby is having trouble sleeping.   Your baby is wetting less than 3 diapers in a 24 hour period.  Your baby has less than 3 stools in a 24 hour period.  Your baby's skin or the white part of his or her eyes becomes more yellow.   Your baby is not gaining weight by 36 days of age. MAKE SURE YOU:   Understand these instructions.  Will watch your condition.  Will get help right away if you are not doing well or get worse. Document Released: 08/15/2005 Document Revised: 05/09/2012 Document Reviewed: 03/21/2012 [facility] Patient Information 2014 [place],  [place].

## 2013-04-25 NOTE — Progress Notes (Signed)
C/o sciatica.--advice given Start Valtrex Rx for Stimate given--instructed to bring in for labor. BP is good--not truly CHTN on no meds--normal labs, normal growth--followed by MFM.

## 2013-04-30 ENCOUNTER — Telehealth: Payer: Self-pay | Admitting: *Deleted

## 2013-04-30 ENCOUNTER — Other Ambulatory Visit: Payer: Self-pay | Admitting: Family Medicine

## 2013-04-30 ENCOUNTER — Encounter (HOSPITAL_COMMUNITY): Payer: Self-pay

## 2013-04-30 ENCOUNTER — Ambulatory Visit (HOSPITAL_COMMUNITY)
Admission: RE | Admit: 2013-04-30 | Discharge: 2013-04-30 | Disposition: A | Discharge status: Home / Self Care | Payer: Medicaid Other | Source: Ambulatory Visit | Attending: Family Medicine | Admitting: Family Medicine

## 2013-04-30 VITALS — BP 138/82 | HR 89 | Wt 251.0 lb

## 2013-04-30 DIAGNOSIS — O10919 Unspecified pre-existing hypertension complicating pregnancy, unspecified trimester: Secondary | ICD-10-CM

## 2013-04-30 DIAGNOSIS — E669 Obesity, unspecified: Secondary | ICD-10-CM | POA: Insufficient documentation

## 2013-04-30 DIAGNOSIS — A6000 Herpesviral infection of urogenital system, unspecified: Secondary | ICD-10-CM

## 2013-04-30 DIAGNOSIS — O350XX0 Maternal care for (suspected) central nervous system malformation in fetus, not applicable or unspecified: Secondary | ICD-10-CM

## 2013-04-30 DIAGNOSIS — O3500X0 Maternal care for (suspected) central nervous system malformation or damage in fetus, unspecified, not applicable or unspecified: Secondary | ICD-10-CM | POA: Insufficient documentation

## 2013-04-30 DIAGNOSIS — IMO0002 Reserved for concepts with insufficient information to code with codable children: Secondary | ICD-10-CM

## 2013-04-30 DIAGNOSIS — O360191 Maternal care for anti-D [Rh] antibodies, unspecified trimester, fetus 1: Secondary | ICD-10-CM

## 2013-04-30 DIAGNOSIS — O10913 Unspecified pre-existing hypertension complicating pregnancy, third trimester: Secondary | ICD-10-CM

## 2013-04-30 DIAGNOSIS — O10019 Pre-existing essential hypertension complicating pregnancy, unspecified trimester: Secondary | ICD-10-CM | POA: Insufficient documentation

## 2013-04-30 DIAGNOSIS — D680 Von Willebrand's disease: Secondary | ICD-10-CM

## 2013-04-30 NOTE — Progress Notes (Signed)
Genetic Counseling  High-Risk Gestation Note  Appointment Date:  04/30/2013 Referred By: Reva Bores, MD Date of Birth:  1990-12-30    Pregnancy History: G2P0010 Estimated Date of Delivery: 06/09/13 Estimated Gestational Age: [redacted]w[redacted]d Attending: Rema Fendt, MD   I met with [name] for genetic counseling given her reported personal history of Von Willebrand disease.   We began by reviewing the patient's medical history and the family histories in detail. [name] reported that she was evaluated and diagnosed with Von Willebrand Disease (VWD) through [facility] at age 46 or 22 years old following the onset of menorrhagia. The patient reported that she was prescribed birth control pills, which treated her menorrhagia. She reported no additional symptoms of bleeding or easy bruising. The patient reported that no additional relatives have a diagnosis of Von Willebrand disease. However, she reported that her mother also had heavy menstrual periods, requiring hysterectomy. Her mother reportedly has not been evaluated for VWD.  Additionally, the patient reported that her maternal half-brother has a history of nosebleeds. The patient reported that her brother was also evaluated for VWD and was found to not have this condition.   We discussed that von Willebrand disease (VWD), is a congenital bleeding disorder caused by deficient or defective plasma von Willebrand factor (VWF).  It may only become apparent on hemostatic challenge, and bleeding history may become more apparent with increasing age.  There are several different types of VWD.  Type 1 is the most common (70%) and typically is inherited as an autosomal dominant condition and manifests as mild bleeding.  Type 1 is due to a partial quantitative deficiency of an essentially normal VWF.   Type 2 affects approximately 25% and has multiple subtypes.  Most of the subtypes manifest as mild to moderate  bleeding, but may also include thrombocytopenia or more severe bleeding.  This type is due to a qualitative defect in VWF and the severity is associated with the VWF function disturbed by the mutation.  Type 3 is a complete quantitative deficiency of VWF, and accounts for less than 5% of individuals with VWD.  It manifests as severe bleeding and bleeding within joints.   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   We reviewed autosomal dominant inheritance.  We discussed that the VWF gene is located on chromosome 12.  VWF is a large multimeric glycoprotein with a pivotal role in primary hemostasis by mediating platelet hemostatic function and stabilizing blood coagulation factor VIII (FVIII). In type 1 VWD, a mutation in one copy of the VWF gene reduces the amount of VWF available for coagulation.  If a person has VWD and has a mutation in one VWF allele, with each pregnancy there is a 50% chance to pass on the VWF allele with the mutation and a 50% chance to pass on the VWF allele without the mutation.  This couple understands therefore, that with each pregnancy, there is a 50% chance to have a child with VWD and a 50% chance to have a child who does not have VWD.  If a child were to inherit the VWF mutation, we would expect the clinical course to be similar.  Meaning, that if  Carly Rush has type 1 VWD, we would expect an affected child to also have type 1 VWD, not another type.  However, there is some variability  of expression even within a family.  The amount of VWF present can vary even within a family with the same mutation; some of this variability is related to blood type.  Therefore, a specific prediction on the clinical severity or percent VWF present cannot be provided.    She was then counseled regarding the option of prenatal diagnosis for VWD.  We discussed that approximately 60-65%  of persons with VWD have a detectable VWF mutation identified on DNA testing.  This means that not all persons with VWD will have an identifiable mutation.  Prior to initiating prenatal testing,  [name] would need to be tested to determine the mutation present in the family and if that mutation was detectable.   If a mutation was not found, prenatal testing would not be available.  If a mutation was found, then fetal cells could be obtained by amniocentesis depending upon the gestation at that time, and DNA could be extracted from those cells and tested for the familial mutation.  She was counseled about the risk of 1 in 300-500 for amniocentesis, the primary complication being spontaneous pregnancy loss. Given the late gestational age of the current pregnancy, [name] expressed that she was not interested in genetic testing for herself and no interested in prenatal diagnosis for Von Willebrand disease. [name] planned to pursue postnatal evaluation for the current pregnancy for VWD.   Additionally, the patient reported that the father of the pregnancy had learning disabilities in school, requiring special classes. He is otherwise healthy. An underlying cause is not known for his learning disabilities. We discussed that learning disabilities are quite common and most of the time we do not know why they occur.  Sometimes, learning disabilities may result from an underlying genetic condition.  In the absence of an underlying condition, when one parent has learning disabilities this increases their child's chance to have learning disabilities as well.  It would be important for the couple's pediatrician to follow their child's developmental progress closely in order to allow early intervention if warranted.  The family histories were otherwise found to be contributory for autism for the patient's maternal second cousin and for the father of the pregnancy's female paternal first cousin. An underlying  etiology was not known for either individual's autism. We discussed that autism is part of the spectrum of conditions referred to as Autistic spectrum disorders (ASD). We discussed that ASDs are among the most common neurodevelopmental disorders, with approximately 1 in 88 children meeting criteria for ASD. Approximately 80% of individuals diagnosed are female. There is strong evidence that genetic factors play a critical role in development of ASD. There have been recent advances in identifying specific genetic causes of ASD, however, there are still many individuals for whom the etiology of the ASD is not known. Once a family has a child with a diagnosis of ASD, there is a 13.5% chance to have another child with ASD. If the pregnancy is female the chance is approximately 9%, and approximately 26% if the pregnancy is female. We discussed that data are limited regarding recurrence risk estimate for autism for extended relatives. However, we discussed that given the degree of relation of these individuals, recurrence risk would likely be similar to the general population risk, in the case of multifactorial inheritance. They understand that at this time there is not genetic screening or testing available for ASD for most families in a pregnancy. Without further information regarding the provided family history, an accurate genetic risk  cannot be calculated. Further genetic counseling is warranted if more information is obtained.  Carly Rush denied exposure to environmental toxins or chemical agents. She denied the use of alcohol, tobacco or street drugs. She denied significant viral illnesses during the course of her pregnancy. Her medical and surgical histories were otherwise noncontributory.   I counseled Carly Rush regarding the above risks and available options.  The approximate face-to-face time with the genetic counselor was 40 minutes.  Quinn Plowman, MS Certified Genetic  Counselor 04/30/2013

## 2013-04-30 NOTE — Telephone Encounter (Signed)
I picked a strength set up in EPIC.  Epocrates looks like 150 mcg/spray is correct strength.  Please call pharmacy [id number] at [facility] and ask--then call in appropriate med/strength to pharmacy or perhaps our pharmacy can call and clarify.

## 2013-04-30 NOTE — Telephone Encounter (Signed)
Mellody Dance from [facility] called regarding a prescription sent in for desmopressin 1.5 per ml. States the only strength he can get is DDAVP .1mg  per ml which is 15 times different strength and she would have to use a significant amount . He wanted to clarify if that is the strength you need or if there is another pharmacy you know that you can get this from?

## 2013-04-30 NOTE — Progress Notes (Signed)
Carly Rush was seen for ultrasound appointment today.  Please see AS-OBGYN report for details.

## 2013-05-02 MED ORDER — DESMOPRESSIN ACETATE 1.5 MG/ML NA SOLN: 2.0000 | Freq: Every day | NASAL | Status: DC

## 2013-05-02 NOTE — Telephone Encounter (Signed)
Called Carly Rush in [facility] pharmacy and discussed Dr. Shawnie Pons request- she will call outpatient pharmacy and see if they can order it- is a med that most pharmacies do not keep in stock- is very expensive. Carly Rush will call us back

## 2013-05-02 NOTE — Telephone Encounter (Signed)
The pt. Has been instructed to  Get medication for use pp. We will not have it in stock...she can get from pharmacy now and use after delivery while in hospital and at home.

## 2013-05-02 NOTE — Telephone Encounter (Signed)
Raynelle Fanning from pharmacy called back- [facility] pharmacy can order the medicine for the patient -order placed in EPIC by nurse- it may take a while for med to arrive- Also want to clarify when patient should start this - per pharmacy is usually around time of delivery.  Will send message to Dr. Shawnie Pons

## 2013-05-03 ENCOUNTER — Ambulatory Visit (HOSPITAL_COMMUNITY)
Admission: RE | Admit: 2013-05-03 | Discharge: 2013-05-03 | Disposition: A | Discharge status: Home / Self Care | Payer: Medicaid Other | Source: Ambulatory Visit | Attending: Obstetrics & Gynecology | Admitting: Obstetrics & Gynecology

## 2013-05-03 ENCOUNTER — Encounter (HOSPITAL_COMMUNITY): Payer: Self-pay

## 2013-05-03 VITALS — BP 125/66 | Wt 252.5 lb

## 2013-05-03 DIAGNOSIS — O3500X0 Maternal care for (suspected) central nervous system malformation or damage in fetus, unspecified, not applicable or unspecified: Secondary | ICD-10-CM | POA: Insufficient documentation

## 2013-05-03 DIAGNOSIS — O10019 Pre-existing essential hypertension complicating pregnancy, unspecified trimester: Secondary | ICD-10-CM | POA: Insufficient documentation

## 2013-05-03 DIAGNOSIS — E669 Obesity, unspecified: Secondary | ICD-10-CM | POA: Insufficient documentation

## 2013-05-03 DIAGNOSIS — O350XX0 Maternal care for (suspected) central nervous system malformation in fetus, not applicable or unspecified: Secondary | ICD-10-CM | POA: Insufficient documentation

## 2013-05-03 DIAGNOSIS — A6000 Herpesviral infection of urogenital system, unspecified: Secondary | ICD-10-CM

## 2013-05-06 ENCOUNTER — Ambulatory Visit (HOSPITAL_COMMUNITY)
Admission: RE | Admit: 2013-05-06 | Discharge: 2013-05-06 | Disposition: A | Discharge status: Home / Self Care | Payer: Medicaid Other | Source: Ambulatory Visit | Attending: Obstetrics & Gynecology | Admitting: Obstetrics & Gynecology

## 2013-05-06 VITALS — BP 128/76 | HR 93 | Wt 254.0 lb

## 2013-05-06 DIAGNOSIS — O10913 Unspecified pre-existing hypertension complicating pregnancy, third trimester: Secondary | ICD-10-CM

## 2013-05-06 DIAGNOSIS — E669 Obesity, unspecified: Secondary | ICD-10-CM | POA: Insufficient documentation

## 2013-05-06 DIAGNOSIS — O10019 Pre-existing essential hypertension complicating pregnancy, unspecified trimester: Secondary | ICD-10-CM | POA: Insufficient documentation

## 2013-05-06 DIAGNOSIS — O350XX0 Maternal care for (suspected) central nervous system malformation in fetus, not applicable or unspecified: Secondary | ICD-10-CM | POA: Insufficient documentation

## 2013-05-06 DIAGNOSIS — O3500X0 Maternal care for (suspected) central nervous system malformation or damage in fetus, unspecified, not applicable or unspecified: Secondary | ICD-10-CM | POA: Insufficient documentation

## 2013-05-06 NOTE — Progress Notes (Signed)
[facility]  Indication: 22 yr old G2P0010 at [redacted]w[redacted]d with chronic hypertension and von willebrand's disease for AFI.  Findings: 1. Single intrauterine pregnancy. 2. Anterior placenta without evidence of previa. 3. Normal amniotic fluid index. 4. The limited anatomy survey is normal.  Recommendations: 1. Chronic hypertension: - well controlled off of medication - recommend fetal growth in 3 weeks - recommend continue antenatal testing with twice weekly NSTs and weekly AFI - recommend close surveillance for the development of signs/symptoms of preeclampsia - recommend delivery by estimated due date but not prior to 38 weeks in the absence of other complications 2. Von willebrand's disease: - no consult requested 3. Previous finding of choroid plexus cyst: - declined aneuploidy screening  Eulis Foster, MD

## 2013-05-09 ENCOUNTER — Ambulatory Visit (INDEPENDENT_AMBULATORY_CARE_PROVIDER_SITE_OTHER): Payer: Medicaid Other | Admitting: Obstetrics & Gynecology

## 2013-05-09 VITALS — BP 129/68 | Temp 97.1°F | Wt 256.3 lb

## 2013-05-09 DIAGNOSIS — O10013 Pre-existing essential hypertension complicating pregnancy, third trimester: Secondary | ICD-10-CM

## 2013-05-09 DIAGNOSIS — O10019 Pre-existing essential hypertension complicating pregnancy, unspecified trimester: Secondary | ICD-10-CM

## 2013-05-09 LAB — POCT URINALYSIS DIP (DEVICE)
Hgb urine dipstick: NEGATIVE
Nitrite: NEGATIVE
Specific Gravity, Urine: 1.025 (ref 1.005–1.030)
Urobilinogen, UA: 0.2 mg/dL (ref 0.0–1.0)
pH: 7 (ref 5.0–8.0)

## 2013-05-09 NOTE — Progress Notes (Signed)
Pulse- 83 Pt has question about stimate medication what she is suppose to do

## 2013-05-09 NOTE — Progress Notes (Signed)
NOrmal growth on [place], borderline BP today. Needs NST

## 2013-05-09 NOTE — Telephone Encounter (Signed)
Nofied by [facility] pharmacy they could not order the medicine because Medicaid has already [facility]. Patient here in clinic and states she [facility] but hasn't used it yet. Arlys John, pharmacist at [facility] pharmacy called [facility] and was informed it was the right strength- per his advise I talked with Erlene who is in the clinic today. Instructed her to call Arlys John when she gets home with the ndc on her medicine and he will verify is the right strength. Also verified patient is aware to not start the medicine until postpartum as instructed by Dr. Shawnie Pons. Rhea verifies understanding.

## 2013-05-09 NOTE — Patient Instructions (Signed)

## 2013-05-09 NOTE — Progress Notes (Signed)
P= 82,  

## 2013-05-09 NOTE — Telephone Encounter (Signed)
[facility] and [facility]. Also called [facility] and notified them patient will take it postpartum.

## 2013-05-13 ENCOUNTER — Other Ambulatory Visit (HOSPITAL_COMMUNITY): Payer: Medicaid Other

## 2013-05-13 ENCOUNTER — Ambulatory Visit (HOSPITAL_COMMUNITY): Payer: Medicaid Other

## 2013-05-14 ENCOUNTER — Other Ambulatory Visit (HOSPITAL_COMMUNITY): Payer: Self-pay | Admitting: Maternal and Fetal Medicine

## 2013-05-14 ENCOUNTER — Inpatient Hospital Stay (HOSPITAL_COMMUNITY)
Admission: AD | Admit: 2013-05-14 | Discharge: 2013-05-14 | Disposition: A | Discharge status: Home / Self Care | Payer: Medicaid Other | Source: Ambulatory Visit | Attending: Obstetrics & Gynecology | Admitting: Obstetrics & Gynecology

## 2013-05-14 ENCOUNTER — Ambulatory Visit (HOSPITAL_COMMUNITY)
Admission: RE | Admit: 2013-05-14 | Discharge: 2013-05-14 | Disposition: A | Discharge status: Home / Self Care | Payer: Medicaid Other | Source: Ambulatory Visit | Attending: Maternal and Fetal Medicine | Admitting: Maternal and Fetal Medicine

## 2013-05-14 ENCOUNTER — Encounter (HOSPITAL_COMMUNITY): Payer: Self-pay | Admitting: *Deleted

## 2013-05-14 ENCOUNTER — Ambulatory Visit (HOSPITAL_COMMUNITY)
Admission: RE | Admit: 2013-05-14 | Discharge: 2013-05-14 | Disposition: A | Discharge status: Home / Self Care | Payer: Medicaid Other | Source: Ambulatory Visit | Attending: Obstetrics & Gynecology | Admitting: Obstetrics & Gynecology

## 2013-05-14 VITALS — BP 132/75 | HR 77 | Wt 259.0 lb

## 2013-05-14 DIAGNOSIS — O10913 Unspecified pre-existing hypertension complicating pregnancy, third trimester: Secondary | ICD-10-CM

## 2013-05-14 DIAGNOSIS — O4100X0 Oligohydramnios, unspecified trimester, not applicable or unspecified: Secondary | ICD-10-CM | POA: Insufficient documentation

## 2013-05-14 DIAGNOSIS — D680 Von Willebrand's disease: Secondary | ICD-10-CM

## 2013-05-14 DIAGNOSIS — O4100X1 Oligohydramnios, unspecified trimester, fetus 1: Secondary | ICD-10-CM

## 2013-05-14 DIAGNOSIS — O10013 Pre-existing essential hypertension complicating pregnancy, third trimester: Secondary | ICD-10-CM

## 2013-05-14 NOTE — Progress Notes (Signed)
Up to [facility]. Dr Caleb Popp on unit

## 2013-05-14 NOTE — Progress Notes (Signed)
Written and verbal d/c instructions given and understanding voiced. 

## 2013-05-14 NOTE — Progress Notes (Signed)
Spec exam done and slide made for fern. No pooling of fld noted

## 2013-05-14 NOTE — Progress Notes (Signed)
Dr Caleb Popp notified of pt's admission and status. Will see pt

## 2013-05-14 NOTE — Progress Notes (Signed)
Carly Rush  was seen today for an ultrasound appointment.  See full report in AS-OB/GYN.  Impression: Single IUP at 36 2/7 weeks Hx of chronic hypertension, Von Willebrand disease Limited ultrasound for amniotic fluid assessment Oligohydramnios noted with AFI of 4.7 cm. Reactive NST.  Recommendations: Ultrasound findings discussed with Dr. Penne Lash. Will sent to MAU to evaluate for ruptured membranes.   If there is no evidence of ruptured membranes, recommend follow up AFI later this week (has repeat NST scheduled Thursday) If oligohydramnois is persistent, recommend induction of labor at 37 weeks.  Alpha Gula, MD

## 2013-05-14 NOTE — MAU Provider Note (Signed)
  History     CSN: [id number]  Arrival date and time: 05/14/13 1032   None     Chief Complaint  Patient presents with  . Rupture of Membranes   HPI Comments: Ms. Worthington presents after having an ultrasound revealing oligohydramnios with an AFI of 4.7cm. She was sent to MAU to check for evidence of rupture. She reports no LOF, no vaginal bleeding, no vaginal discharge, no contractions and good fetal movement.   OB History   Grav Para Term Preterm Abortions TAB SAB Ect Mult Living   2 0 0 0 1 1 0 0 0 0       Past Medical History  Diagnosis Date  . Asthma   . Headache(784.0)   . Gonorrhea 2011  . Chlamydia 2011  . Von Willebrand disease     Past Surgical History  Procedure Laterality Date  . Tonsillectomy    . Induced abortion      Family History  Problem Relation Age of Onset  . Anesthesia problems Neg Hx   . Cancer Mother   . Hypertension Father     History  Substance Use Topics  . Smoking status: Former Games developer  . Smokeless tobacco: Never Used  . Alcohol Use: No     Comment: 3-4 drinks occasionally    Allergies: No Known Allergies  Prescriptions prior to admission  Medication Sig Dispense Refill  . Prenatal Vit-Fe Fumarate-FA (PRENATAL MULTIVITAMIN) TABS Take 1 tablet by mouth daily.      . valACYclovir (VALTREX) 1000 MG tablet Take 0.5 tablets (500 mg total) by mouth 2 (two) times daily.  60 tablet  2  . desmopressin (STIMATE) 1.5 MG/ML SOLN Place 2 sprays into the nose daily.      . [DISCONTINUED] desmopressin (STIMATE) 1.5 MG/ML SOLN Place 2 sprays (0.3 mg total) into the nose daily.  2.5 mL  0    Review of Systems  Gastrointestinal: Negative for abdominal pain.   FHT: 135bpm, moderate variability, +accelerations, no decels; Category I tracing UC: None  Physical Exam   Blood pressure 130/69, pulse 87, temperature 98.1 F (36.7 C), temperature source Oral, resp. rate 20, height 5\' 5"  (1.651 m), weight 117.482 kg (259 lb), last menstrual period  09/02/2012, SpO2 100.00%.  Physical Exam  Constitutional: She appears well-developed. No distress.  Genitourinary: Vagina normal. Pelvic exam was performed with patient supine. Cervix exhibits no discharge. No erythema, tenderness or bleeding around the vagina. No signs of injury around the vagina. No vaginal discharge found.  No pooling. No leakage of fluid from os.  Skin: She is not diaphoretic.    MAU Course  Procedures None  MDM Speculum exam showed no pooling, no fluid from cervical OS. Ferning was negative.  Assessment and Plan   22yo G1P0 at [redacted]w[redacted]d with oligohydramnios presenting for possible rupture of membranes.  #Oligohydramnios - ferning was negative, most likely no rupture - repeat NST on Thursday with plan to deliver at 37w if persistent oligohydramnios per MFM plan - labor precautions given  Jacquelin Hawking, MD 05/14/2013, 12:24 PM  Evaluation and management procedures were performed by Resident physician under my supervision/collaboration. Chart reviewed, patient examined by me and I agree with management and plan.

## 2013-05-14 NOTE — Progress Notes (Signed)
FHR difficult to trace. Adjusted.

## 2013-05-14 NOTE — Progress Notes (Signed)
Ok to leave pt off efm per Dr Caleb Popp when pt returned from [facility]

## 2013-05-14 NOTE — MAU Note (Signed)
Patient was sent from MFM to be evaluated for ROM; Patient states fluid on U/S went from 10 to 4. Denies LOF, bleeding, nor contractions. Reports good fetal movement.

## 2013-05-15 ENCOUNTER — Encounter (HOSPITAL_COMMUNITY): Payer: Self-pay

## 2013-05-15 ENCOUNTER — Encounter: Payer: Self-pay | Admitting: *Deleted

## 2013-05-15 ENCOUNTER — Inpatient Hospital Stay (HOSPITAL_COMMUNITY)
Admission: AD | Admit: 2013-05-15 | Discharge: 2013-05-15 | DRG: 782 | Disposition: A | Discharge status: Home / Self Care | Payer: Medicaid Other | Source: Ambulatory Visit | Attending: Obstetrics and Gynecology | Admitting: Obstetrics and Gynecology

## 2013-05-15 DIAGNOSIS — N949 Unspecified condition associated with female genital organs and menstrual cycle: Secondary | ICD-10-CM

## 2013-05-15 DIAGNOSIS — O368190 Decreased fetal movements, unspecified trimester, not applicable or unspecified: Secondary | ICD-10-CM

## 2013-05-15 DIAGNOSIS — O4100X0 Oligohydramnios, unspecified trimester, not applicable or unspecified: Principal | ICD-10-CM

## 2013-05-15 HISTORY — DX: Essential (primary) hypertension: I10

## 2013-05-15 NOTE — MAU Provider Note (Signed)
  History     CSN: [id number]  Arrival date and time: 05/15/13 1308   First Provider Initiated Contact with Patient 05/15/13 2147      Chief Complaint  Patient presents with  . Vaginal Discharge   HPI Ms Gelardi is a 22yo G2P0010 at 36.4wks who presents for eval of decreased fetal movement and questions re recent dx of oligohydramnios. Denies pain, leaking or bldg. Reports recent U/S showing AFI to be 4.7cm and her concerns re fetal well-being related to low fluid.  OB History   Grav Para Term Preterm Abortions TAB SAB Ect Mult Living   2 0 0 0 1 1 0 0 0 0       Past Medical History  Diagnosis Date  . Asthma   . Headache(784.0)   . Gonorrhea 2011  . Chlamydia 2011  . Von Willebrand disease   . Hypertension     Past Surgical History  Procedure Laterality Date  . Tonsillectomy    . Induced abortion      Family History  Problem Relation Age of Onset  . Anesthesia problems Neg Hx   . Cancer Mother   . Hypertension Father     History  Substance Use Topics  . Smoking status: Former Games developer  . Smokeless tobacco: Never Used  . Alcohol Use: No     Comment: 3-4 drinks occasionally    Allergies: No Known Allergies  Prescriptions prior to admission  Medication Sig Dispense Refill  . Prenatal Vit-Fe Fumarate-FA (PRENATAL MULTIVITAMIN) TABS Take 1 tablet by mouth daily.      . valACYclovir (VALTREX) 1000 MG tablet Take 0.5 tablets (500 mg total) by mouth 2 (two) times daily.  60 tablet  2  . desmopressin (STIMATE) 1.5 MG/ML SOLN Place 2 sprays into the nose daily.        ROS Physical Exam   Blood pressure 127/60, pulse 87, temperature 98 F (36.7 C), temperature source Oral, resp. rate 18, height 5\' 4"  (1.626 m), weight 117.992 kg (260 lb 2 oz), last menstrual period 09/02/2012, SpO2 99.00%.  Physical Exam  Constitutional: She is oriented to person, place, and time. She appears well-developed.  HENT:  Head: Normocephalic.  Cardiovascular: Normal rate.    Respiratory: Effort normal.  GI:  EFM 130-140, +accels, no decels No ctx per toco  Musculoskeletal: Normal range of motion.  Neurological: She is alert and oriented to person, place, and time.  Skin: Skin is warm and dry.  Psychiatric: She has a normal mood and affect. Her behavior is normal. Thought content normal.    MAU Course  Procedures    Assessment and Plan  IUP at 36.4wks Oligohydramnios Decreased FM  D/C home with fetal kick counts and labor precautions Reassured re plan of care Keep next scheduled visit in the AM for U/S Further plan of care to be made based on those results  Cam Hai 05/15/2013, 9:56 PM

## 2013-05-15 NOTE — MAU Note (Signed)
Patient is in with c/o no fetal movement since yesterday and possible leaking fluids. Patient states that she was told that she have low amniotic fluid yesterday. She denies contractions and vaginal bleeding.

## 2013-05-16 ENCOUNTER — Ambulatory Visit (HOSPITAL_COMMUNITY)
Admission: RE | Admit: 2013-05-16 | Discharge: 2013-05-16 | Disposition: A | Discharge status: Home / Self Care | Payer: Medicaid Other | Source: Ambulatory Visit | Attending: Obstetrics & Gynecology | Admitting: Obstetrics & Gynecology

## 2013-05-16 ENCOUNTER — Other Ambulatory Visit: Payer: Self-pay | Admitting: Obstetrics & Gynecology

## 2013-05-16 ENCOUNTER — Ambulatory Visit (INDEPENDENT_AMBULATORY_CARE_PROVIDER_SITE_OTHER): Payer: Medicaid Other | Admitting: Obstetrics & Gynecology

## 2013-05-16 VITALS — BP 126/67 | Wt 260.3 lb

## 2013-05-16 DIAGNOSIS — O4100X0 Oligohydramnios, unspecified trimester, not applicable or unspecified: Secondary | ICD-10-CM | POA: Insufficient documentation

## 2013-05-16 DIAGNOSIS — O10013 Pre-existing essential hypertension complicating pregnancy, third trimester: Secondary | ICD-10-CM

## 2013-05-16 DIAGNOSIS — D680 Von Willebrand's disease: Secondary | ICD-10-CM

## 2013-05-16 DIAGNOSIS — O4103X1 Oligohydramnios, third trimester, fetus 1: Secondary | ICD-10-CM

## 2013-05-16 DIAGNOSIS — E669 Obesity, unspecified: Secondary | ICD-10-CM | POA: Insufficient documentation

## 2013-05-16 DIAGNOSIS — O10019 Pre-existing essential hypertension complicating pregnancy, unspecified trimester: Secondary | ICD-10-CM

## 2013-05-16 DIAGNOSIS — O3500X0 Maternal care for (suspected) central nervous system malformation or damage in fetus, unspecified, not applicable or unspecified: Secondary | ICD-10-CM | POA: Insufficient documentation

## 2013-05-16 DIAGNOSIS — O350XX0 Maternal care for (suspected) central nervous system malformation in fetus, not applicable or unspecified: Secondary | ICD-10-CM | POA: Insufficient documentation

## 2013-05-16 LAB — POCT URINALYSIS DIP (DEVICE)
Glucose, UA: NEGATIVE mg/dL
Hgb urine dipstick: NEGATIVE
Nitrite: NEGATIVE
Specific Gravity, Urine: 1.025 (ref 1.005–1.030)
Urobilinogen, UA: 0.2 mg/dL (ref 0.0–1.0)
pH: 6.5 (ref 5.0–8.0)

## 2013-05-16 LAB — OB RESULTS CONSOLE GBS: GBS: NEGATIVE

## 2013-05-16 LAB — OB RESULTS CONSOLE GC/CHLAMYDIA: Gonorrhea: NEGATIVE

## 2013-05-16 NOTE — Progress Notes (Signed)
Pulse: 79 Pt reports pelvic pressure and cramps.

## 2013-05-16 NOTE — Progress Notes (Signed)
Patient sent to U/S now and they will work her into their schedule this morning.

## 2013-05-16 NOTE — Progress Notes (Signed)
NST performed today was reviewed and was found to be reactive.  AFI on 9/16 was 4.74 cm, SDP 2.6 cm, will recheck today as per MFM. If still oligohydramnios, will schedule IOL at 37 weeks.  Continue recommended antenatal testing and prenatal care. No ROM noted on exam, pelvic cultures done. No other complaints or concerns.  Preeclampsia, fetal movement and labor precautions reviewed.

## 2013-05-16 NOTE — MAU Provider Note (Signed)
Attestation of Attending Supervision of Advanced Practitioner: Evaluation and management procedures were performed by the PA/NP/CNM/OB Fellow under my supervision/collaboration. Chart reviewed and agree with management and plan.  Aarilyn Dye V 05/16/2013 11:25 PM

## 2013-05-16 NOTE — Patient Instructions (Signed)
Return to clinic for any obstetric concerns or go to MAU for evaluation  

## 2013-05-17 LAB — GC/CHLAMYDIA PROBE AMP
CT Probe RNA: NEGATIVE
GC Probe RNA: NEGATIVE

## 2013-05-20 ENCOUNTER — Encounter: Payer: Self-pay | Admitting: Obstetrics & Gynecology

## 2013-05-20 ENCOUNTER — Ambulatory Visit (HOSPITAL_COMMUNITY)
Admission: RE | Admit: 2013-05-20 | Discharge: 2013-05-20 | Disposition: A | Discharge status: Home / Self Care | Payer: Medicaid Other | Source: Ambulatory Visit | Attending: Obstetrics and Gynecology | Admitting: Obstetrics and Gynecology

## 2013-05-20 DIAGNOSIS — O10913 Unspecified pre-existing hypertension complicating pregnancy, third trimester: Secondary | ICD-10-CM

## 2013-05-20 DIAGNOSIS — O10019 Pre-existing essential hypertension complicating pregnancy, unspecified trimester: Secondary | ICD-10-CM | POA: Insufficient documentation

## 2013-05-20 DIAGNOSIS — E669 Obesity, unspecified: Secondary | ICD-10-CM | POA: Insufficient documentation

## 2013-05-23 ENCOUNTER — Ambulatory Visit (INDEPENDENT_AMBULATORY_CARE_PROVIDER_SITE_OTHER): Payer: Medicaid Other | Admitting: Obstetrics & Gynecology

## 2013-05-23 VITALS — BP 128/66 | Temp 98.0°F | Wt 263.7 lb

## 2013-05-23 DIAGNOSIS — O10013 Pre-existing essential hypertension complicating pregnancy, third trimester: Secondary | ICD-10-CM

## 2013-05-23 DIAGNOSIS — O10019 Pre-existing essential hypertension complicating pregnancy, unspecified trimester: Secondary | ICD-10-CM

## 2013-05-23 DIAGNOSIS — G5600 Carpal tunnel syndrome, unspecified upper limb: Secondary | ICD-10-CM

## 2013-05-23 DIAGNOSIS — G5601 Carpal tunnel syndrome, right upper limb: Secondary | ICD-10-CM

## 2013-05-23 LAB — POCT URINALYSIS DIP (DEVICE)
Bilirubin Urine: NEGATIVE
Ketones, ur: NEGATIVE mg/dL
Leukocytes, UA: NEGATIVE
Protein, ur: NEGATIVE mg/dL
Specific Gravity, Urine: 1.02 (ref 1.005–1.030)
pH: 7 (ref 5.0–8.0)

## 2013-05-23 NOTE — Progress Notes (Signed)
P= 81,  C/o right hand numbness at times,  Edema in feet,

## 2013-05-23 NOTE — Progress Notes (Signed)
Plan IOL 39 weeks if not sooner for HTN. AFI was nl. RNST  Wrist brace for R CTS

## 2013-05-23 NOTE — Patient Instructions (Signed)

## 2013-05-25 NOTE — MAU Provider Note (Signed)
Attestation of Attending Supervision of Advanced Practitioner (CNM/NP): Evaluation and management procedures were performed by the Advanced Practitioner under my supervision and collaboration. I have reviewed the Advanced Practitioner's note and chart, and I agree with the management and plan.  Ted Goodner H. 4:38 PM   

## 2013-05-26 ENCOUNTER — Inpatient Hospital Stay (EMERGENCY_DEPARTMENT_HOSPITAL)
Admission: AD | Admit: 2013-05-26 | Discharge: 2013-05-26 | Disposition: A | Discharge status: Home / Self Care | Payer: Medicaid Other | Source: Ambulatory Visit | Attending: Obstetrics and Gynecology | Admitting: Obstetrics and Gynecology

## 2013-05-26 ENCOUNTER — Encounter (HOSPITAL_COMMUNITY): Payer: Self-pay | Admitting: Family

## 2013-05-26 DIAGNOSIS — O1002 Pre-existing essential hypertension complicating childbirth: Secondary | ICD-10-CM | POA: Diagnosis present

## 2013-05-26 DIAGNOSIS — D689 Coagulation defect, unspecified: Secondary | ICD-10-CM | POA: Diagnosis present

## 2013-05-26 DIAGNOSIS — D6800 Von Willebrand disease, unspecified: Secondary | ICD-10-CM | POA: Diagnosis present

## 2013-05-26 DIAGNOSIS — A6000 Herpesviral infection of urogenital system, unspecified: Secondary | ICD-10-CM

## 2013-05-26 DIAGNOSIS — O4100X0 Oligohydramnios, unspecified trimester, not applicable or unspecified: Principal | ICD-10-CM | POA: Diagnosis present

## 2013-05-26 DIAGNOSIS — O99891 Other specified diseases and conditions complicating pregnancy: Secondary | ICD-10-CM | POA: Insufficient documentation

## 2013-05-26 DIAGNOSIS — N949 Unspecified condition associated with female genital organs and menstrual cycle: Secondary | ICD-10-CM | POA: Insufficient documentation

## 2013-05-26 DIAGNOSIS — O98519 Other viral diseases complicating pregnancy, unspecified trimester: Secondary | ICD-10-CM | POA: Insufficient documentation

## 2013-05-26 DIAGNOSIS — O0993 Supervision of high risk pregnancy, unspecified, third trimester: Secondary | ICD-10-CM

## 2013-05-26 LAB — POCT FERN TEST: POCT Fern Test: NEGATIVE

## 2013-05-26 NOTE — MAU Note (Signed)
Pt presents with complaints of possible rupture of membranes. She states that she was at work and noticed some leakage at 230. Denies any bleeding.

## 2013-05-26 NOTE — MAU Note (Addendum)
22 yo, G2P0 at [redacted]w[redacted]d, presents to MAU with c/o questionable clear ROM at 1430. Reports she was standing at work then noticed a wet area in underwear when she went to BR.  Denies VB, contractions, pain. Reports +FM. Hx of HSV; is taking Valtrex, however reports missing some doses.

## 2013-05-26 NOTE — MAU Provider Note (Signed)
  History     CSN: [id number]  Arrival date and time: 05/26/13 1651   None     Chief Complaint  Patient presents with  . Rupture of Membranes   HPI Comments: Terrell Shimko is a 22 y.o. G2P0010 at [redacted]w[redacted]d who presents because of leaking fluid. Today around 1430, she noticed her underwear was wet. She changed her underwear and the second pair was a little wet, so she came in to the MAU. She reports no headaches, blurry vision, vaginal bleeding, vaginal discharge, no contractions. She reports good fetal movement.   OB History   Grav Para Term Preterm Abortions TAB SAB Ect Mult Living   2 0 0 0 1 1 0 0 0 0       Past Medical History  Diagnosis Date  . Asthma   . Headache(784.0)   . Gonorrhea 2011  . Chlamydia 2011  . Von Willebrand disease   . Hypertension     Past Surgical History  Procedure Laterality Date  . Tonsillectomy    . Induced abortion      Family History  Problem Relation Age of Onset  . Anesthesia problems Neg Hx   . Cancer Mother   . Hypertension Father     History  Substance Use Topics  . Smoking status: Former Games developer  . Smokeless tobacco: Never Used  . Alcohol Use: No     Comment: 3-4 drinks occasionally    Allergies: No Known Allergies  Prescriptions prior to admission  Medication Sig Dispense Refill  . Prenatal Vit-Fe Fumarate-FA (PRENATAL MULTIVITAMIN) TABS Take 1 tablet by mouth daily.      . valACYclovir (VALTREX) 1000 MG tablet Take 0.5 tablets (500 mg total) by mouth 2 (two) times daily.  60 tablet  2  . desmopressin (STIMATE) 1.5 MG/ML SOLN Place 2 sprays into the nose daily.        Review of Systems  Constitutional: Negative for fever.  Eyes: Negative for blurred vision.  Neurological: Negative for headaches.   Physical Exam   Blood pressure 159/88, pulse 88, temperature 98.9 F (37.2 C), temperature source Oral, resp. rate 16, height 5\' 4"  (1.626 m), weight 119.75 kg (264 lb), last menstrual period 09/02/2012.  Physical Exam   Genitourinary: Cervix exhibits no discharge. No bleeding around the vagina. Vaginal discharge found.  No pooling   FHT: 135, moderate variability, +accels, no decels, Category I tracing UC: None  MAU Course  Procedures  MDM - speculum exam revealed milky discharge, no pooling - ferning was negative  Assessment and Plan  Genee Rann is a 22 y.o. G2P0010 at [redacted]w[redacted]d who presents with increased vaginal discharge  Plan - labor precautions - encouraged to keep prenatal care appointment tomorrow - discharge home, stable, no rupture  Jacquelin Hawking 05/26/2013, 6:01 PM

## 2013-05-26 NOTE — MAU Provider Note (Signed)
I saw pt and agree with assessment.

## 2013-05-27 ENCOUNTER — Ambulatory Visit (HOSPITAL_COMMUNITY)
Admission: RE | Admit: 2013-05-27 | Discharge: 2013-05-27 | Disposition: A | Discharge status: Home / Self Care | Payer: Medicaid Other | Source: Ambulatory Visit | Attending: Obstetrics & Gynecology | Admitting: Obstetrics & Gynecology

## 2013-05-27 ENCOUNTER — Ambulatory Visit (HOSPITAL_COMMUNITY)
Admission: RE | Admit: 2013-05-27 | Discharge: 2013-05-27 | Disposition: A | Discharge status: Home / Self Care | Payer: Medicaid Other | Source: Ambulatory Visit

## 2013-05-27 ENCOUNTER — Inpatient Hospital Stay (HOSPITAL_COMMUNITY)
Admission: AD | Admit: 2013-05-27 | Discharge: 2013-05-31 | DRG: 774 | Disposition: A | Discharge status: Home / Self Care | Payer: Medicaid Other | Source: Ambulatory Visit | Attending: Obstetrics & Gynecology | Admitting: Obstetrics & Gynecology

## 2013-05-27 ENCOUNTER — Encounter (HOSPITAL_COMMUNITY): Payer: Self-pay

## 2013-05-27 ENCOUNTER — Encounter (HOSPITAL_COMMUNITY): Payer: Self-pay | Admitting: *Deleted

## 2013-05-27 DIAGNOSIS — O360190 Maternal care for anti-D [Rh] antibodies, unspecified trimester, not applicable or unspecified: Secondary | ICD-10-CM

## 2013-05-27 DIAGNOSIS — O10019 Pre-existing essential hypertension complicating pregnancy, unspecified trimester: Secondary | ICD-10-CM

## 2013-05-27 DIAGNOSIS — O4100X0 Oligohydramnios, unspecified trimester, not applicable or unspecified: Secondary | ICD-10-CM

## 2013-05-27 DIAGNOSIS — O0990 Supervision of high risk pregnancy, unspecified, unspecified trimester: Secondary | ICD-10-CM

## 2013-05-27 DIAGNOSIS — O350XX0 Maternal care for (suspected) central nervous system malformation in fetus, not applicable or unspecified: Secondary | ICD-10-CM

## 2013-05-27 DIAGNOSIS — A6000 Herpesviral infection of urogenital system, unspecified: Secondary | ICD-10-CM

## 2013-05-27 DIAGNOSIS — O10913 Unspecified pre-existing hypertension complicating pregnancy, third trimester: Secondary | ICD-10-CM

## 2013-05-27 LAB — COMPREHENSIVE METABOLIC PANEL
AST: 26 U/L (ref 0–37)
Albumin: 2.7 g/dL — ABNORMAL LOW (ref 3.5–5.2)
Alkaline Phosphatase: 363 U/L — ABNORMAL HIGH (ref 39–117)
CO2: 20 mEq/L (ref 19–32)
Calcium: 8.9 mg/dL (ref 8.4–10.5)
Creatinine, Ser: 0.6 mg/dL (ref 0.50–1.10)
GFR calc non Af Amer: >90 mL/min (ref >90)

## 2013-05-27 LAB — CBC
MCHC: 33.5 g/dL (ref 30.0–36.0)
Platelets: 190 10*3/uL (ref 150–400)
RDW: 15.7 % — ABNORMAL HIGH (ref 11.5–15.5)

## 2013-05-27 LAB — PROTIME-INR
INR: 0.99 (ref 0.00–1.49)
Prothrombin Time: 12.9 seconds (ref 11.6–15.2)

## 2013-05-27 LAB — PROTEIN / CREATININE RATIO, URINE
Protein Creatinine Ratio: 0.1 (ref 0.00–0.15)
Total Protein, Urine: 5.3 mg/dL

## 2013-05-27 LAB — RPR: RPR Ser Ql: NONREACTIVE

## 2013-05-27 LAB — PREPARE RBC (CROSSMATCH)

## 2013-05-27 LAB — APTT: aPTT: 85 seconds — ABNORMAL HIGH (ref 24–37)

## 2013-05-27 MED ORDER — TERBUTALINE SULFATE 1 MG/ML IJ SOLN: 0.2500 mg | Freq: Once | INTRAMUSCULAR | Status: AC | PRN

## 2013-05-27 MED ORDER — OXYTOCIN 10 UNIT/ML IJ SOLN
INTRAMUSCULAR | Status: AC
  Filled 2013-05-27: qty 4

## 2013-05-27 MED ORDER — OXYTOCIN BOLUS FROM INFUSION: 500.0000 mL | INTRAVENOUS | Status: DC

## 2013-05-27 MED ORDER — LACTATED RINGERS IV SOLN: 500.0000 mL | INTRAVENOUS | Status: DC | PRN

## 2013-05-27 MED ORDER — MISOPROSTOL 25 MCG QUARTER TABLET
25.0000 ug | ORAL_TABLET | Freq: Once | ORAL | Status: AC
  Administered 2013-05-27: 25 ug via VAGINAL

## 2013-05-27 MED ORDER — ACETAMINOPHEN 325 MG PO TABS: 650.0000 mg | ORAL_TABLET | ORAL | Status: DC | PRN

## 2013-05-27 MED ORDER — OXYTOCIN 40 UNITS IN LACTATED RINGERS INFUSION - SIMPLE MED
62.5000 mL/h | INTRAVENOUS | Status: DC
  Administered 2013-05-29 (×2): 62.5 mL/h via INTRAVENOUS
  Filled 2013-05-27: qty 1000

## 2013-05-27 MED ORDER — MORPHINE SULFATE 0.5 MG/ML IJ SOLN
INTRAMUSCULAR | Status: AC
  Filled 2013-05-27: qty 10

## 2013-05-27 MED ORDER — ONDANSETRON HCL 4 MG/2ML IJ SOLN
INTRAMUSCULAR | Status: AC
  Filled 2013-05-27: qty 2

## 2013-05-27 MED ORDER — ONDANSETRON HCL 4 MG/2ML IJ SOLN: 4.0000 mg | Freq: Four times a day (QID) | INTRAMUSCULAR | Status: DC | PRN

## 2013-05-27 MED ORDER — CITRIC ACID-SODIUM CITRATE 334-500 MG/5ML PO SOLN: 30.0000 mL | ORAL | Status: DC | PRN

## 2013-05-27 MED ORDER — LIDOCAINE-EPINEPHRINE (PF) 2 %-1:200000 IJ SOLN
INTRAMUSCULAR | Status: AC
  Filled 2013-05-27: qty 20

## 2013-05-27 MED ORDER — VITAMIN K1 10 MG/ML IJ SOLN
5.0000 mg | Freq: Once | INTRAMUSCULAR | Status: AC
  Administered 2013-05-27: 5 mg via SUBCUTANEOUS
  Filled 2013-05-27: qty 0.5

## 2013-05-27 MED ORDER — FLEET ENEMA 7-19 GM/118ML RE ENEM: 1.0000 | ENEMA | RECTAL | Status: DC | PRN

## 2013-05-27 MED ORDER — PHENYLEPHRINE 40 MCG/ML (10ML) SYRINGE FOR IV PUSH (FOR BLOOD PRESSURE SUPPORT)
PREFILLED_SYRINGE | INTRAVENOUS | Status: AC
  Filled 2013-05-27: qty 10

## 2013-05-27 MED ORDER — LACTATED RINGERS IV SOLN
INTRAVENOUS | Status: DC
  Administered 2013-05-27 (×2): via INTRAVENOUS
  Administered 2013-05-28: 100 mL/h via INTRAVENOUS
  Administered 2013-05-29 (×2): via INTRAVENOUS

## 2013-05-27 MED ORDER — OXYCODONE-ACETAMINOPHEN 5-325 MG PO TABS: 1.0000 | ORAL_TABLET | ORAL | Status: DC | PRN

## 2013-05-27 MED ORDER — MISOPROSTOL 200 MCG PO TABS
50.0000 ug | ORAL_TABLET | ORAL | Status: DC
  Administered 2013-05-27 (×2): 50 ug via ORAL
  Filled 2013-05-27 (×6): qty 1
  Filled 2013-05-27: qty 0.25
  Filled 2013-05-27 (×7): qty 1

## 2013-05-27 MED ORDER — LIDOCAINE HCL (PF) 1 % IJ SOLN
30.0000 mL | INTRAMUSCULAR | Status: DC | PRN
Start: 2013-05-27 — End: 2013-05-29
  Filled 2013-05-27: qty 30

## 2013-05-27 MED ORDER — SODIUM BICARBONATE 8.4 % IV SOLN
INTRAVENOUS | Status: AC
  Filled 2013-05-27: qty 50

## 2013-05-27 MED ORDER — IBUPROFEN 600 MG PO TABS: 600.0000 mg | ORAL_TABLET | Freq: Four times a day (QID) | ORAL | Status: DC | PRN

## 2013-05-27 NOTE — Progress Notes (Signed)
Attempted to use Beacon Monitor on patient with poor results. Now using external monitors to trace FH and uterine activity

## 2013-05-27 NOTE — Progress Notes (Signed)
Carly Rush is a 22 y.o. G2P0010 at [redacted]w[redacted]d by ultrasound admitted for induction of labor due to Low amniotic fluid. Pt with a h/o von willebrands.  She is not sure which type.  She reports that she has never had a significant bleed however, her mother reports that she was diagnosed due to excessive and irregular menstral bleeding.  She denies other complications throughtout this pregnancy but, has been followed by MFM for h/o chronic HTN.    Subjective: Pt with no complaints at present.  Objective: BP 130/73  Pulse 87  Temp(Src) 98.5 F (36.9 C) (Oral)  Ht 5\' 4"  (1.626 m)  Wt 266 lb (120.657 kg)  BMI 45.64 kg/m2  LMP 09/02/2012      FHT:  FHR: 150's bpm, variability: moderate,  accelerations:  Present,  decelerations:  Absent UC:   none SVE:   Dilation: 1 Effacement (%): 40 Station: -3 Exam by:: Dr. Tinnie Rush,  Labs: Lab Results  Component Value Date   WBC 15.0* 05/27/2013   HGB 12.0 05/27/2013   HCT 35.8* 05/27/2013   MCV 78.0 05/27/2013   PLT 190 05/27/2013    Assessment / Plan: Cervical ripening s/p Cytotecx1 Pt with Von willebrand's.  The records have not been sent from [facility] where initial tests were done.  Those records are being requested again now.  I have consulted Dr.Chilsm of Heme/Onc and he has consulted Dr. Cyndie Rush:  As pts VWF and VIII results will not be back for some time here are the recommendations: No treatment needed for SVD unless bleeding ensures For c-section DDAVP 0.48mcg/kg IV x1 repeat at 12hours and 24hours  Her INR is currently elevated rec Vit K and FFP if active bleeding - Vit K 5mg  SQ x1 ordered. Will repeat in 4 hours  Dr. Arby Rush in anesthesia aware of pt and recommendations    Fetal Wellbeing:  Category I Pain Control:  Labor support without medications I/D:  n/a Anticipated MOD:  NSVD  Carly Rush 05/27/2013, 3:05 PM

## 2013-05-27 NOTE — Progress Notes (Signed)
Carly Rush  was seen today for an ultrasound appointment.  See full report in [facility].  Impression: Single IUP at 38 1/7 weeks Limited ultrasound for amniotic fluid assessment Oligohydramnios noted (AFI 3.6 cm) Reactive NST  Recommendations: Recommend induction of labor due to oligohydramnios. Discussed with Dr. Erin Fulling - patient sent to L&D for induction of labor  Alpha Gula, MD

## 2013-05-27 NOTE — Progress Notes (Signed)
Luis Sami is a 22 y.o. G2P0010 at [redacted]w[redacted]d admitted for induction of labor due to Low amniotic fluid..  Subjective: Doing well with minimal pain with ctx  Objective: BP 125/72  Pulse 71  Temp(Src) 98.5 F (36.9 C) (Axillary)  Resp 18  Ht 5\' 4"  (1.626 m)  Wt 120.657 kg (266 lb)  BMI 45.64 kg/m2  LMP 09/02/2012      FHT:  FHR: 130s bpm, variability: moderate,  accelerations:  Present,  decelerations:  Absent UC:   irRegular SVE:   Dilation: 1.5 Effacement (%): 50 Station: -2 Exam by:: Dr. Ike Bene  Labs: Lab Results  Component Value Date   WBC 15.0* 05/27/2013   HGB 12.0 05/27/2013   HCT 35.8* 05/27/2013   MCV 78.0 05/27/2013   PLT 190 05/27/2013    Assessment / Plan: Induction of labor due to Oligohydramnios,  Starting on cytotec   Labor: Cytotec x2, desires PV, will give 1 dose PV then place Foley bulb if needed at next check. Preeclampsia:  no signs or symptoms of toxicity Fetal Wellbeing:  Category I Pain Control:  Labor support without medications I/D:  n/a Anticipated MOD:  NSVD  Hailley Byers RYAN 05/27/2013, 9:37 PM

## 2013-05-27 NOTE — H&P (Signed)
Carly Rush is a 22 y.o. female G2P0010 with IUP at [redacted]w[redacted]d presenting for IOL for oligohydramnios (AFI 3.66) . Pt states she has been having no contractions, associated with no vaginal bleeding.  Membranes are intact, with active fetal movement.    [facility] at [facility] since [redacted]w[redacted]d wks. Pt with a hx of Vonwillebrand's disease although she is unsure what type. She was diagnosed due to menorrhagia around the age of 22. She has never had a serious bleed or required a blood transfusion. She has a hx of one TAB and denied any serious bleeding. Pt also has a hx fo cHTN for which she was on labetalol initially in pregnancy but took herself off it around 20 weeks.   Her prenatal care was complicated slightly by oligohydramnios at 33 weeks that resolved and then reappeared today on [place].    Prenatal History/Complications:  Past Medical History: Past Medical History  Diagnosis Date  . Asthma   . Headache(784.0)   . Gonorrhea 2011  . Chlamydia 2011  . Von Willebrand disease   . Hypertension     Past Surgical History: Past Surgical History  Procedure Laterality Date  . Tonsillectomy    . Induced abortion      Obstetrical History: OB History   Grav Para Term Preterm Abortions TAB SAB Ect Mult Living   2 0 0 0 1 1 0 0 0 0       Social History: History   Social History  . Marital Status: Single    Spouse Name: N/A    Number of Children: N/A  . Years of Education: N/A   Social History Main Topics  . Smoking status: Former Games developer  . Smokeless tobacco: Never Used  . Alcohol Use: No     Comment: 3-4 drinks occasionally  . Drug Use: No  . Sexual Activity: Yes    Birth Control/ Protection: None   Other Topics Concern  . None   Social History Narrative  . None    Family History: Family History  Problem Relation Age of Onset  . Anesthesia problems Neg Hx   . Cancer Mother   . Hypertension Father     Allergies: No Known Allergies  Prescriptions prior to admission  Medication Sig  Dispense Refill  . Prenatal Vit-Fe Fumarate-FA (PRENATAL MULTIVITAMIN) TABS Take 1 tablet by mouth daily.      . valACYclovir (VALTREX) 1000 MG tablet Take 0.5 tablets (500 mg total) by mouth 2 (two) times daily.  60 tablet  2  . desmopressin (STIMATE) 1.5 MG/ML SOLN Place 2 sprays into the nose daily.         Review of Systems   Constitutional: Negative for fever, chills, weight loss, malaise/fatigue and diaphoresis.  HENT: Negative for hearing loss, ear pain, nosebleeds, congestion, sore throat, neck pain, tinnitus and ear discharge.   Eyes: Negative for blurred vision, double vision, photophobia, pain, discharge and redness.  Respiratory: Negative for cough, hemoptysis, sputum production, shortness of breath, wheezing and stridor.   Cardiovascular: Negative for chest pain, palpitations, orthopnea,  leg swelling  Gastrointestinal: Positive for abdominal pain. Negative for heartburn, nausea, vomiting, diarrhea, constipation, blood in stool Genitourinary: Negative for dysuria, urgency, frequency, hematuria and flank pain.  Musculoskeletal: Negative for myalgias, back pain, joint pain and falls.  Skin: Negative for itching and rash.  Neurological: Negative for dizziness, tingling, tremors, sensory change, speech change, focal weakness, seizures, loss of consciousness, weakness and headaches.  Endo/Heme/Allergies: Negative for environmental allergies and polydipsia. Does not bruise/bleed easily.  Psychiatric/Behavioral: Negative for depression, suicidal ideas, hallucinations, memory loss and substance abuse. The patient is not nervous/anxious and does not have insomnia.       Blood pressure 133/84, pulse 83, temperature 98.4 F (36.9 C), temperature source Oral, last menstrual period 09/02/2012. General appearance: alert, cooperative and no distress Lungs: clear to auscultation bilaterally Heart: regular rate and rhythm Abdomen: soft, non-tender; bowel sounds normal Pelvic: 1/40/-3 VTX.  Adequate pelvis  Extremities: Homans sign is negative, no sign of DVT DTR's 1+, symmetric Presentation: cephalic Fetal monitoringBaseline: 130 bpm, Variability: Good {> 6 bpm), Accelerations: Reactive and Decelerations: Absent Uterine activityNone Dilation: 1 Effacement (%): 40 Station: -3 Exam by:: Dr. Tinnie Gens,   Prenatal labs: ABO, Rh: O/NEG/-- (03/20 1056) Antibody: NEG (07/31 1035) Rubella:   RPR: NON REAC (07/31 1035)  HBsAg: NEGATIVE (03/20 1056)  HIV: NON REACTIVE (07/31 1035)  GBS: Negative (09/18 0000)  1 hr Glucola 93 Genetic screening  declined Anatomy US normal   Assessment: Carly Rush is a 22 y.o. G2P0010 with an IUP at [redacted]w[redacted]d presenting for IOL for oligohydramnios.   Plan: 1) IOL - admit to L&D - routine orders - discuss with anesthesia about epidural given vonwillebrands - ripen with cytotec 50mg  po  2) FWB - cat I tracing - GBS neg -EFW 7.5#  3) cHTN - send PIH labs for baseline -monitor BP  4) von willebrand  - will need stimate after delivery  - type and cross 2U   5) anticipate SVD  Carly Rush L, MD 05/27/2013, 12:48 PM

## 2013-05-27 NOTE — H&P (Signed)
Attestation of Attending Supervision of Fellow: Evaluation and management procedures were performed by the Fellow under my supervision and collaboration.  I have reviewed the Fellow's note and chart, and I agree with the management and plan.    

## 2013-05-28 ENCOUNTER — Encounter (HOSPITAL_COMMUNITY): Payer: Self-pay | Admitting: *Deleted

## 2013-05-28 LAB — FACTOR 8 ASSAY

## 2013-05-28 MED ORDER — ZOLPIDEM TARTRATE 5 MG PO TABS: 5.0000 mg | ORAL_TABLET | Freq: Every evening | ORAL | Status: DC | PRN

## 2013-05-28 MED ORDER — OXYTOCIN 40 UNITS IN LACTATED RINGERS INFUSION - SIMPLE MED
1.0000 m[IU]/min | INTRAVENOUS | Status: DC
  Administered 2013-05-28: 2 m[IU]/min via INTRAVENOUS
  Filled 2013-05-28: qty 1000

## 2013-05-28 NOTE — Progress Notes (Signed)
Carly Rush is a 22 y.o. G2P0010 at [redacted]w[redacted]d admitted for induction of labor due to Low amniotic fluid..  Subjective: Doing well. Inc painful ctx, Foley bulb placed  Objective: BP 125/72  Pulse 71  Temp(Src) 98.5 F (36.9 C) (Axillary)  Resp 18  Ht 5\' 4"  (1.626 m)  Wt 120.657 kg (266 lb)  BMI 45.64 kg/m2  LMP 09/02/2012      FHT:  FHR: 130s bpm, variability: moderate,  accelerations:  Present,  decelerations:  Absent not tracing wel UC:   irRegular SVE:   Dilation: 1.5 Effacement (%): 50 Station: -2 Exam by:: Dr. Ike Bene  Labs: Lab Results  Component Value Date   WBC 15.0* 05/27/2013   HGB 12.0 05/27/2013   HCT 35.8* 05/27/2013   MCV 78.0 05/27/2013   PLT 190 05/27/2013    Assessment / Plan: Induction of labor due to Oligohydramnios,  S/p cytotec x3 and foley bulb placed at 0200  Labor: Now with foley bulb in place at 0200. continue to monitor Preeclampsia:  no signs or symptoms of toxicity Fetal Wellbeing:  Category I when tracing Pain Control:  Labor support without medications I/D:  n/a Anticipated MOD:  NSVD  Juddson Cobern RYAN 05/28/2013, 2:41 AM

## 2013-05-28 NOTE — Progress Notes (Signed)
   Subjective: Pt reports increased pain with contractions.  Objective: BP 125/69  Pulse 82  Temp(Src) 98.5 F (36.9 C) (Axillary)  Resp 18  Ht 5\' 4"  (1.626 m)  Wt 120.657 kg (266 lb)  BMI 45.64 kg/m2  LMP 09/02/2012      FHT:  FHR: 130's bpm, variability: moderate,  accelerations:  Present,  decelerations:  Absent UC:   irregular, every poor tracing; audibly 6-8 SVE:   Dilation: 1.5 Effacement (%): 50 Station: -2 Exam by:: Odom  Labs: Lab Results  Component Value Date   WBC 15.0* 05/27/2013   HGB 12.0 05/27/2013   HCT 35.8* 05/27/2013   MCV 78.0 05/27/2013   PLT 190 05/27/2013    Assessment / Plan: Augmentation of labor, progressing well  Labor: Progressing normally Preeclampsia:  n/a Fetal Wellbeing:  Category I Pain Control:  Labor support without medications I/D:  GBS neg Anticipated MOD:  NSVD  MUHAMMAD,Oluwatoyin Banales 05/28/2013, 1:08 PM

## 2013-05-28 NOTE — Progress Notes (Signed)
   Subjective: Reports feeling cramping with foley bulb.    Objective: BP 130/80  Pulse 77  Temp(Src) 99 F (37.2 C) (Axillary)  Resp 18  Ht 5\' 4"  (1.626 m)  Wt 120.657 kg (266 lb)  BMI 45.64 kg/m2  LMP 09/02/2012      FHT:  FHR: 120's bpm, variability: moderate,  accelerations:  Present,  decelerations:  Absent UC:   irregular SVE:   Dilation: 4 Effacement (%): 50 Station: -3 Exam by:: Jerolyn Center, CNM  Labs: Lab Results  Component Value Date   WBC 15.0* 05/27/2013   HGB 12.0 05/27/2013   HCT 35.8* 05/27/2013   MCV 78.0 05/27/2013   PLT 190 05/27/2013   Foley Bulb in vagina, removed without difficulty .    Assessment / Plan: Induction of Labor, progressing well  Labor: Progressing normally Preeclampsia:  n/a Fetal Wellbeing:  Category I Pain Control:  Labor support without medications I/D:  GBS neg Anticipated MOD:  NSVD Begin pitocin augmentation.  [facility] 05/28/2013, 5:36 PM

## 2013-05-28 NOTE — Progress Notes (Signed)
Carly Rush is a 22 y.o. G2P0010 at [redacted]w[redacted]d by admitted for induction of labor due to Low amniotic fluid..  Subjective:   Objective: BP 143/82  Pulse 72  Temp(Src) 98.5 F (36.9 C) (Oral)  Resp 18  Ht 5\' 4"  (1.626 m)  Wt 120.657 kg (266 lb)  BMI 45.64 kg/m2  LMP 09/02/2012      FHT:  FHR: 145 bpm, variability: moderate,  accelerations:  Present,  decelerations:  Absent UC:   regular, every 3-5 minutes SVE:   Dilation: 4 Effacement (%): 50 Station: -2 Exam by:: lee  Labs: Lab Results  Component Value Date   WBC 15.0* 05/27/2013   HGB 12.0 05/27/2013   HCT 35.8* 05/27/2013   MCV 78.0 05/27/2013   PLT 190 05/27/2013    Assessment / Plan: Induction of labor due to oligo,  progressing well on pitocin  Labor: still in latent phase Preeclampsia:  NA Fetal Wellbeing:  Category I Pain Control:  Labor support without medications I/D:  n/a Anticipated MOD:  NSVD  Tawnya Crook 05/28/2013, 9:16 PM

## 2013-05-29 ENCOUNTER — Encounter (HOSPITAL_COMMUNITY): Payer: Self-pay | Admitting: *Deleted

## 2013-05-29 DIAGNOSIS — O4100X0 Oligohydramnios, unspecified trimester, not applicable or unspecified: Secondary | ICD-10-CM

## 2013-05-29 DIAGNOSIS — O1002 Pre-existing essential hypertension complicating childbirth: Secondary | ICD-10-CM

## 2013-05-29 DIAGNOSIS — D689 Coagulation defect, unspecified: Secondary | ICD-10-CM

## 2013-05-29 DIAGNOSIS — D6800 Von Willebrand disease, unspecified: Secondary | ICD-10-CM

## 2013-05-29 DIAGNOSIS — O9912 Other diseases of the blood and blood-forming organs and certain disorders involving the immune mechanism complicating childbirth: Secondary | ICD-10-CM

## 2013-05-29 LAB — PROTIME-INR
INR: 0.93 (ref 0.00–1.49)
Prothrombin Time: 12.3 seconds (ref 11.6–15.2)

## 2013-05-29 MED ORDER — PRENATAL MULTIVITAMIN CH
1.0000 | ORAL_TABLET | Freq: Every day | ORAL | Status: DC
  Administered 2013-05-30: 1 via ORAL
  Filled 2013-05-29: qty 1

## 2013-05-29 MED ORDER — SODIUM CHLORIDE 0.9 % IJ SOLN: 9.0000 mL | INTRAMUSCULAR | Status: DC | PRN

## 2013-05-29 MED ORDER — SENNOSIDES-DOCUSATE SODIUM 8.6-50 MG PO TABS
2.0000 | ORAL_TABLET | ORAL | Status: DC
  Administered 2013-05-30: 2 via ORAL

## 2013-05-29 MED ORDER — SODIUM CHLORIDE 0.9 % IV SOLN
0.3000 ug/kg | Freq: Once | INTRAVENOUS | Status: DC
  Filled 2013-05-29: qty 9.1

## 2013-05-29 MED ORDER — PROMETHAZINE HCL 25 MG/ML IJ SOLN
25.0000 mg | Freq: Once | INTRAMUSCULAR | Status: AC
  Administered 2013-05-29: 25 mg via INTRAVENOUS
  Filled 2013-05-29: qty 1

## 2013-05-29 MED ORDER — OXYCODONE-ACETAMINOPHEN 5-325 MG PO TABS: 1.0000 | ORAL_TABLET | ORAL | Status: DC | PRN

## 2013-05-29 MED ORDER — BENZOCAINE-MENTHOL 20-0.5 % EX AERO
1.0000 "application " | INHALATION_SPRAY | CUTANEOUS | Status: DC | PRN
  Administered 2013-05-29: 1 via TOPICAL
  Filled 2013-05-29: qty 56

## 2013-05-29 MED ORDER — DIBUCAINE 1 % RE OINT: 1.0000 "application " | TOPICAL_OINTMENT | RECTAL | Status: DC | PRN

## 2013-05-29 MED ORDER — SIMETHICONE 80 MG PO CHEW: 80.0000 mg | CHEWABLE_TABLET | ORAL | Status: DC | PRN

## 2013-05-29 MED ORDER — ONDANSETRON HCL 4 MG/2ML IJ SOLN
4.0000 mg | Freq: Four times a day (QID) | INTRAMUSCULAR | Status: DC | PRN
  Administered 2013-05-29: 4 mg via INTRAVENOUS
  Filled 2013-05-29: qty 2

## 2013-05-29 MED ORDER — IBUPROFEN 600 MG PO TABS
600.0000 mg | ORAL_TABLET | Freq: Four times a day (QID) | ORAL | Status: DC
  Administered 2013-05-29 – 2013-05-31 (×7): 600 mg via ORAL
  Filled 2013-05-29 (×7): qty 1

## 2013-05-29 MED ORDER — ONDANSETRON HCL 4 MG/2ML IJ SOLN: 4.0000 mg | INTRAMUSCULAR | Status: DC | PRN

## 2013-05-29 MED ORDER — NALBUPHINE SYRINGE 5 MG/0.5 ML
10.0000 mg | INJECTION | Freq: Once | INTRAMUSCULAR | Status: AC
  Administered 2013-05-29: 10 mg via INTRAMUSCULAR
  Filled 2013-05-29: qty 1

## 2013-05-29 MED ORDER — ZOLPIDEM TARTRATE 5 MG PO TABS: 5.0000 mg | ORAL_TABLET | Freq: Every evening | ORAL | Status: DC | PRN

## 2013-05-29 MED ORDER — LANOLIN HYDROUS EX OINT: TOPICAL_OINTMENT | CUTANEOUS | Status: DC | PRN

## 2013-05-29 MED ORDER — WITCH HAZEL-GLYCERIN EX PADS: 1.0000 "application " | MEDICATED_PAD | CUTANEOUS | Status: DC | PRN

## 2013-05-29 MED ORDER — DESMOPRESSIN ACE REFRIGERATED 0.01 % NA SOLN
2.0000 [drp] | Freq: Two times a day (BID) | NASAL | Status: DC
  Filled 2013-05-29: qty 2.5

## 2013-05-29 MED ORDER — NALOXONE HCL 0.4 MG/ML IJ SOLN: 0.4000 mg | INTRAMUSCULAR | Status: DC | PRN

## 2013-05-29 MED ORDER — TETANUS-DIPHTH-ACELL PERTUSSIS 5-2.5-18.5 LF-MCG/0.5 IM SUSP: 0.5000 mL | Freq: Once | INTRAMUSCULAR | Status: DC

## 2013-05-29 MED ORDER — DIPHENHYDRAMINE HCL 50 MG/ML IJ SOLN: 12.5000 mg | Freq: Four times a day (QID) | INTRAMUSCULAR | Status: DC | PRN

## 2013-05-29 MED ORDER — FENTANYL 10 MCG/ML IV SOLN
INTRAVENOUS | Status: DC
  Administered 2013-05-29: 11:00:00 via INTRAVENOUS
  Filled 2013-05-29: qty 50

## 2013-05-29 MED ORDER — ONDANSETRON HCL 4 MG PO TABS: 4.0000 mg | ORAL_TABLET | ORAL | Status: DC | PRN

## 2013-05-29 MED ORDER — DIPHENHYDRAMINE HCL 25 MG PO CAPS: 25.0000 mg | ORAL_CAPSULE | Freq: Four times a day (QID) | ORAL | Status: DC | PRN

## 2013-05-29 MED ORDER — DESMOPRESSIN ACETATE 1.5 MG/ML NA SOLN
2.0000 | Freq: Two times a day (BID) | NASAL | Status: DC
  Administered 2013-05-29 – 2013-05-31 (×4): 0.3 mg via NASAL

## 2013-05-29 MED ORDER — MISOPROSTOL 200 MCG PO TABS
ORAL_TABLET | ORAL | Status: AC
  Administered 2013-05-29: 800 ug
  Filled 2013-05-29: qty 4

## 2013-05-29 MED ORDER — DIPHENHYDRAMINE HCL 12.5 MG/5ML PO ELIX
12.5000 mg | ORAL_SOLUTION | Freq: Four times a day (QID) | ORAL | Status: DC | PRN
  Filled 2013-05-29: qty 5

## 2013-05-29 NOTE — Progress Notes (Signed)
Mathews Robinsons, CNM on unit, orders received to decrease Pitocin to 12 mu/min.

## 2013-05-29 NOTE — Progress Notes (Signed)
Carly Rush is a 22 y.o. G2P0010 at [redacted]w[redacted]d admitted for IOL oligo Subjective:  States the contractions are getting stronger. Coping well. Supportive family at bedside.   Objective: BP 145/79  Pulse 85  Temp(Src) 98.5 F (36.9 C) (Oral)  Resp 18  Ht 5\' 4"  (1.626 m)  Wt 120.657 kg (266 lb)  BMI 45.64 kg/m2  LMP 09/02/2012      FHT:  FHR: 140 bpm, variability: moderate,  accelerations:  Present,  decelerations:  Absent UC:   regular, every 3-5 minutes SVE: 5/90/-1  Labs: Lab Results  Component Value Date   WBC 15.0* 05/27/2013   HGB 12.0 05/27/2013   HCT 35.8* 05/27/2013   MCV 78.0 05/27/2013   PLT 190 05/27/2013    Assessment / Plan: Induction of labor due to oligo,  progressing well on pitocin  Labor: Progressing normally Preeclampsia:  NA Fetal Wellbeing:  Category I Pain Control:  Labor support without medications I/D:  n/a Anticipated MOD:  NSVD  Tawnya Crook 05/29/2013, 12:30 AM

## 2013-05-29 NOTE — Lactation Note (Signed)
This note was copied from the chart of Carly Rush. Lactation Consultation Note  Patient Name: Carly Rush [id number] Date: 05/29/2013 Reason for consult: Initial assessment  LC attempted to visit this mom and baby just 7 hours after delivery.  Mom is up to bathroom with her nurse in attendance after a recent breastfeeding of 10 minutes with LATCH score=7.  Previous attempt was unsuccessful due to baby's sleepiness.  LC provided [employer] Resource brochure with [facility] services and list of community and web site resources.  LC informed mom that she will receive more complete visit and assessment tomorrow.    Maternal Data Formula Feeding for Exclusion: No Infant to breast within first hour of birth: Yes Has patient been taught Hand Expression?: Yes (per RN - see LATCH score at 2200) Does the patient have breastfeeding experience prior to this delivery?: No  Feeding Feeding Type: Breast Milk Length of feed: 10 min  LATCH Score/Interventions Latch: Grasps breast easily, tongue down, lips flanged, rhythmical sucking.  Audible Swallowing: None Intervention(s): Skin to skin;Hand expression  Type of Nipple: Everted at rest and after stimulation  Comfort (Breast/Nipple): Soft / non-tender     Hold (Positioning): Assistance needed to correctly position infant at breast and maintain latch. Intervention(s): Support Pillows;Skin to skin;Breastfeeding basics reviewed  LATCH Score: 7 (per RN, at 2200)  Lactation Tools Discussed/Used   N/A  Consult Status Consult Status: Follow-up Date: 05/30/13 Follow-up type: In-patient    Warrick Parisian [facility] 05/29/2013, 10:39 PM

## 2013-05-29 NOTE — Progress Notes (Signed)
Mathews Robinsons, CNM on unit doing a delivery, RN notified CNM of FHR decels and interventions, no further orders at this time

## 2013-05-29 NOTE — Progress Notes (Signed)
Patient assisted to bathroom and voided without difficulty a large unmeasurable amount.

## 2013-05-29 NOTE — Progress Notes (Signed)
Carly Rush is a 22 y.o. G2P0010 at [redacted]w[redacted]d admitted for induction of labor due to Low amniotic fluid..  Subjective: Tolerating ctx well. Unable to have epidural. Asking for pain meds  Objective: BP 129/64  Pulse 61  Temp(Src) 98.3 F (36.8 C) (Oral)  Resp 18  Ht 5\' 4"  (1.626 m)  Wt 120.657 kg (266 lb)  BMI 45.64 kg/m2  LMP 09/02/2012      FHT:  FHR: 130s bpm, variability: moderate,  accelerations:  Abscent,  decelerations:  Absent UC:   regular, every 4 minutes SVE:   Dilation: 5 Effacement (%): 90 Station: 0 Exam by:: m wilkins rnc  Labs: Lab Results  Component Value Date   WBC 15.0* 05/27/2013   HGB 12.0 05/27/2013   HCT 35.8* 05/27/2013   MCV 78.0 05/27/2013   PLT 190 05/27/2013    Assessment / Plan: Induction of labor due to oligohydramnios,  progressing well on pitocin  Labor: Progressing normally and on pitocin. s/p AROM at ~1015 with mod mec. Internalized with FSE and IUPC Preeclampsia:  no signs or symptoms of toxicity Fetal Wellbeing:  Category I Pain Control:  Unable to have epidural 2/2 vonW def. Will give Fent PCA that will be d/c'd at 9cm I/D:  n/a Anticipated MOD:  NSVD  Carly Rush 05/29/2013, 10:32 AM

## 2013-05-30 ENCOUNTER — Encounter: Payer: Self-pay | Admitting: Advanced Practice Midwife

## 2013-05-30 ENCOUNTER — Other Ambulatory Visit: Payer: Medicaid Other

## 2013-05-30 LAB — CBC
HCT: 32.8 % — ABNORMAL LOW (ref 36.0–46.0)
MCHC: 33.2 g/dL (ref 30.0–36.0)
MCV: 77.9 fL — ABNORMAL LOW (ref 78.0–100.0)
RBC: 4.21 MIL/uL (ref 3.87–5.11)
RDW: 15.6 % — ABNORMAL HIGH (ref 11.5–15.5)
WBC: 20.4 10*3/uL — ABNORMAL HIGH (ref 4.0–10.5)

## 2013-05-30 LAB — VON WILLEBRAND ANTIGEN

## 2013-05-30 MED ORDER — RHO D IMMUNE GLOBULIN 1500 UNIT/2ML IJ SOLN
300.0000 ug | Freq: Once | INTRAMUSCULAR | Status: DC
  Filled 2013-05-30: qty 2

## 2013-05-30 MED ORDER — RHO D IMMUNE GLOBULIN 1500 UNIT/2ML IJ SOLN
300.0000 ug | Freq: Once | INTRAMUSCULAR | Status: AC
  Administered 2013-05-30: 300 ug via INTRAVENOUS
  Filled 2013-05-30: qty 2

## 2013-05-30 NOTE — Progress Notes (Signed)
Post Partum Day #1  Subjective: no complaints, up ad lib and tolerating PO; taking Stimate BID; breastfeeding going well  Objective: Blood pressure 123/79, pulse 67, temperature 97.7 F (36.5 C), temperature source Oral, resp. rate 18, height 5\' 4"  (1.626 m), weight 120.657 kg (266 lb), last menstrual period 09/02/2012, SpO2 98.00%, unknown if currently breastfeeding.  Physical Exam:  General: alert, cooperative and no distress Lungs: nl effort Heart: RRR Lochia: appropriate Uterine Fundus: firm DVT Evaluation: No evidence of DVT seen on physical exam.   Recent Labs  05/27/13 1243 05/30/13 0555  HGB 12.0 10.9*  HCT 35.8* 32.8*    Assessment/Plan: Plan for discharge tomorrow To continue on Stimate at home   LOS: 3 days   Cam Hai 05/30/2013, 7:57 AM

## 2013-05-30 NOTE — Progress Notes (Signed)
UR completed 

## 2013-05-31 LAB — TYPE AND SCREEN
ABO/RH(D): O NEG
DAT, IgG: NEGATIVE
Unit division: 0
Unit division: 0

## 2013-05-31 LAB — RH IG WORKUP (INCLUDES ABO/RH): Gestational Age(Wks): 38

## 2013-05-31 NOTE — Lactation Note (Signed)
This note was copied from the chart of Carly Remona Boom. Lactation Consultation Note Mom states breast feeding is going well. Mom has baby latched on left, audible swallows, rhythmic sucking, mom comfortable.  Mom had questions about using her Medela pump at home. Pump demonstrated to mom. Pump kit provided because her pump is loaned to her from a friend and she doesn't have a pump kit. Mom states she is determined to only provide breast milk and does not want to use any formula. Enc mom to pump to increase milk supply.  Mom states she does see drops of colostrum during hand expression. Mom states she does want to make an O/P appt based on prior LC recommendation due to small, wide spaced breasts. O/p appt made, and appt card given. Enc mom to attend the BFSG. Enc mom to call for help if she has any concerns.  Patient Name: Carly Rush [id number] Date: 05/31/2013 Reason for consult: Follow-up assessment   Maternal Data Has patient been taught Hand Expression?: Yes  Feeding Feeding Type: Breast Milk Length of feed: 15 min  LATCH Score/Interventions Latch: Grasps breast easily, tongue down, lips flanged, rhythmical sucking.  Audible Swallowing: A few with stimulation  Type of Nipple: Everted at rest and after stimulation  Comfort (Breast/Nipple): Soft / non-tender     Hold (Positioning): No assistance needed to correctly position infant at breast.  LATCH Score: 9  Lactation Tools Discussed/Used     Consult Status Consult Status: Follow-up Follow-up type: Out-patient    Octavio Manns [facility] 05/31/2013, 9:55 AM

## 2013-05-31 NOTE — Discharge Summary (Signed)
Obstetric Discharge Summary Reason for Admission: induction of labor Prenatal Procedures: NST and ultrasound Intrapartum Procedures: spontaneous vaginal delivery Postpartum Procedures: Rho(D) Ig and a 800 mg dose of Cytotec PR and a dose of DDAVP. Complications-Operative and Postpartum: none Hemoglobin  Date Value Range Status  05/30/2013 10.9* 12.0 - 15.0 g/dL Final     HCT  Date Value Range Status  05/30/2013 32.8* 36.0 - 46.0 % Final    Physical Exam:  General: alert, cooperative, appears stated age and no distress Lochia: appropriate Uterine Fundus: firm DVT Evaluation: No evidence of DVT seen on physical exam. Negative Homan's sign. No cords or calf tenderness. No significant calf/ankle edema.  Discharge Diagnoses: Term Pregnancy-delivered  Discharge Information: Date: 05/31/2013 Activity: unrestricted Diet: routine Medications: Ibuprofen Condition: stable Instructions: refer to practice specific booklet Discharge to: home   Newborn Data: Live born female  Birth Weight: 6 lb 15.1 oz (3150 g) APGAR: 9, 9  Home with mother. Patient is breastfeeding. Wants contraception but is not sure which type. She wants a list of places to get a circumcision for after discharge.  Bing Plume 05/31/2013, 7:45 AM  I have seen and examined this patient and agree the above assessment. CRESENZO-DISHMAN,Mylisa Brunson 06/02/2013 9:50 PM

## 2013-06-04 ENCOUNTER — Ambulatory Visit (HOSPITAL_COMMUNITY)
Admission: RE | Admit: 2013-06-04 | Discharge: 2013-06-04 | Disposition: A | Discharge status: Home / Self Care | Payer: Medicaid Other | Source: Ambulatory Visit | Attending: Obstetrics & Gynecology | Admitting: Obstetrics & Gynecology

## 2013-06-04 NOTE — Lactation Note (Addendum)
Adult Lactation Consultation Outpatient Visit Note  Patient Name: Carly Rush                                          Baby Boy Carly Rush, DOB [birth year]                                                                                                                              Now 29 days old Date of Birth: 03-14-1991                                               Birth Weight  6 lb. 15.1 oz Gestational Age at Delivery: [redacted]w[redacted]d Type of Delivery: SVB  Breastfeeding History: Frequency of Breastfeeding: At the breast 8-12 times or more in 24 hours. Length of Feeding: when hungry will stay at the breast for 20 minutes, does some snacking with shorter feedings of 5+ minutes Voids: 8+ per day Stools: 4-5/day, yellow in color  Supplementing / Method: Pumping:  Type of Pump:  Medela HP and DEBP   Frequency:  Not pumping currently  Volume:    Comments: Mom is here for feeding assessment. Concerned about milk supply. Reports baby is at the breast 8-12 times in 24 hours with some feedings 20 minutes and some only 5 minutes. She has not been supplementing.   Consultation Evaluation: On exam, Mom is noted to have wide spacing between breast with some tubular shaping to breast. Mom reports her milk came in on Sunday. She did experience some fullness to breast on Sunday when her milk was coming in. Breasts are soft at this visit. Mom reported some nipple tenderness on right nipple that has improved, no breakdown noted. Mom reported the baby had his last feeding at 12:30 for 10 minutes. Baby is noted to have jaundice down past the upper thighs. Mom reports baby was at [facility] yesterday and his bili was at 61. She is to take the baby back tomorrow to [facility] for bili check. Mom reports his weight at the Peds yesterday was 6 lb. 11 oz which would reflect good weight gain since by our scale he has gained 2 oz since yesterday. Baby is also noted to have short, anterior frenulum but tongue mobility is present.  Initial  Feeding Assessment: Pre-feed Weight:  6 lb. 13.0 oz/3090 gm Post-feed Weight:  6 lb. 13.7 oz/3110 gm Amount Transferred:  20 ml. From the right breast with nursing for 8 minutes Comments:  Baby latched easily to the left breast in cross cradle hold demonstrating a good rhythmic suck with some swallows noted. During the feeding as Mom repositioned her arm his latch became more shallow. Assisted to obtain more depth.  Additional Feeding Assessment:  Pre-feed Weight:  6 lb. 13.7 oz/3110 gm Post-feed Weight:  6 lb. 13.8 oz/3114 gm Amount Transferred:  4 ml. Comments:  Re-latched to right breast in football hold. Baby obtained and sustained latch with more depth in this position. Becoming sleepy at the breast after another 8 minutes.  Additional Feeding Assessment: Pre-feed Weight:   6 lb. 13.8 oz/3114 gm Post-feed Weight:  6 lb. 14.4 oz/3130 gm Amount Transferred:   16 ml. Comments:   From left breast in football hold with nursing for approx 15 minutes. Again becoming sleepy at the breast.  Total Breast milk Transferred this Visit: 40 ml.  Total Supplement Given: 4 ml of EBM via curved tipped syringe  Additional Interventions: Called [facility] to notify MD of jaundice down lower extremities to see if they wanted another bili check before tomorrow. Spoke with RN, she will discuss with Dr. Maisie Fus and call Mom back. Mom is to continue to breastfeed at least 8-12 times in 24 hours, keep baby active at the breast for 15-30 minutes on 1st breast, then also offer 2nd breast each feeding. Mom is to post pump 4-6  Times per day and give any amount of EBM she receives back to the baby. Information given on Fenugreek to start to support milk production.  Mom plans to return to work in a few weeks. Reviewed pumping and storage. Advised till we get another weight check and jaundice improves to give EBM back to baby each feeding then she can pump to begin storage of EBM.  Follow-Up OP Lactation appt.  Tuesday, 06/11/13 at 1:00 for feeding assessment and weight check. Peds per their instruction or keep appointment tomorrow.      Carly Rush 06/04/2013, 1:19 PM

## 2013-06-04 NOTE — Discharge Summary (Signed)
Attestation of Attending Supervision of Advanced Practitioner (CNM/NP): Evaluation and management procedures were performed by the Advanced Practitioner under my supervision and collaboration.  I have reviewed the Advanced Practitioner's note and chart, and I agree with the management and plan.  Askia Hazelip 06/04/2013 9:54 AM

## 2013-06-11 ENCOUNTER — Ambulatory Visit (HOSPITAL_COMMUNITY): Payer: Medicaid Other

## 2013-06-12 ENCOUNTER — Ambulatory Visit (HOSPITAL_COMMUNITY): Admission: RE | Admit: 2013-06-12 | Payer: Medicaid Other | Source: Ambulatory Visit

## 2013-06-27 ENCOUNTER — Ambulatory Visit (INDEPENDENT_AMBULATORY_CARE_PROVIDER_SITE_OTHER): Payer: Medicaid Other | Admitting: Advanced Practice Midwife

## 2013-06-27 ENCOUNTER — Encounter: Payer: Self-pay | Admitting: Advanced Practice Midwife

## 2013-06-27 VITALS — BP 127/85 | HR 82 | Temp 97.5°F | Ht 64.0 in | Wt 239.0 lb

## 2013-06-27 DIAGNOSIS — Z3049 Encounter for surveillance of other contraceptives: Secondary | ICD-10-CM

## 2013-06-27 DIAGNOSIS — Z23 Encounter for immunization: Secondary | ICD-10-CM

## 2013-06-27 DIAGNOSIS — Z30430 Encounter for insertion of intrauterine contraceptive device: Secondary | ICD-10-CM

## 2013-06-27 LAB — POCT PREGNANCY, URINE: Preg Test, Ur: NEGATIVE

## 2013-06-27 MED ORDER — LEVONORGESTREL 20 MCG/24HR IU IUD
INTRAUTERINE_SYSTEM | Freq: Once | INTRAUTERINE | Status: AC
  Administered 2013-06-27: 16:00:00 1 via INTRAUTERINE

## 2013-06-27 NOTE — Progress Notes (Signed)
  Subjective:     Carly Rush is a 22 y.o. female who presents for a postpartum visit. She is 4 weeks postpartum following a spontaneous vaginal delivery at term. I have fully reviewed the prenatal and intrapartum course. Outcome: spontaneous vaginal delivery. Anesthesia: none. Postpartum course has been normal. Baby's course has been normal. Baby is feeding by bottle. Bleeding staining only. Bowel function is normal. Bladder function is normal. Patient is sexually active. Contraception method is condoms. Postpartum depression screening: negative.  The following portions of the patient's history were reviewed and updated as appropriate: allergies, current medications, past family history, past medical history, past social history, past surgical history and problem list.  Review of Systems A comprehensive review of systems was negative.   Objective:    BP 127/85  Pulse 82  Temp(Src) 97.5 F (36.4 C) (Oral)  Ht 5\' 4"  (1.626 m)  Wt 239 lb (108.41 kg)  BMI 41 kg/m2  General:  alert, cooperative and appears stated age   Breasts:  negative  Lungs: clear to auscultation bilaterally  Heart:  regular rate and rhythm, S1, S2 normal, no murmur, click, rub or gallop  Abdomen: soft, non-tender; bowel sounds normal; no masses,  no organomegaly   Vulva:  normal  Vagina: normal vagina, no discharge, exudate, lesion, or erythema  Cervix:  multiparous appearance and no lesions  Corpus: normal size, contour, position, consistency, mobility, non-tender  Adnexa:  normal adnexa and no mass, fullness, tenderness  Rectal Exam: Not performed.        IUD Procedure Note Patient identified, informed consent performed.  Discussed risks of irregular bleeding, cramping, infection, malpositioning or misplacement of the IUD outside the uterus which may require further procedures. Time out was performed.  Urine pregnancy test negative.  Speculum placed in the vagina.  Cervix visualized.  Cleaned with Betadine x 2.   Grasped anteriorly with a single tooth tenaculum.  Uterus sounded to 7 cm.  Mirena IUD placed per manufacturer's recommendations.  Strings trimmed to 3 cm. Tenaculum was removed, good hemostasis noted.  Patient tolerated procedure well.   Patient was given post-procedure instructions and the Mirena care card with expiration date.  Patient was also asked to check IUD strings periodically and follow up in 4-6 weeks for IUD check. Assessment:     Normal postpartum exam. Mirena placed at today's visit.     Plan:    1. Contraception: IUD 2. Follow up in: 4 weeks for Mirena check or as needed.

## 2013-07-03 ENCOUNTER — Encounter: Payer: Self-pay | Admitting: *Deleted

## 2013-11-06 ENCOUNTER — Telehealth: Payer: Self-pay | Admitting: *Deleted

## 2013-11-06 NOTE — Telephone Encounter (Signed)
Carly Rush called and left a message she has bad stomach cramps/aches and thinks it is her birth control messing her up.  States couldn't get an appointment until next month. Request a call. Per chart review had IUD 06/27/13 during postpartum visit.   Called [name] and left a message we are returning your call,please call clinic back .

## 2013-11-11 NOTE — Telephone Encounter (Signed)
Called patient and a woman answered and stated she wasn't in right now and there isn't another number to reach her by. Told woman to tell Cheri GuppyCorina we are trying to return your phone call, woman stated she would have her call us back.

## 2013-12-06 ENCOUNTER — Ambulatory Visit: Payer: Self-pay | Admitting: Medical

## 2014-01-01 ENCOUNTER — Encounter: Payer: Self-pay | Admitting: Medical

## 2014-01-01 ENCOUNTER — Ambulatory Visit (INDEPENDENT_AMBULATORY_CARE_PROVIDER_SITE_OTHER): Payer: Medicaid Other | Admitting: Medical

## 2014-01-01 VITALS — BP 122/80 | HR 94 | Temp 97.9°F | Ht 64.0 in | Wt 240.4 lb

## 2014-01-01 DIAGNOSIS — R109 Unspecified abdominal pain: Secondary | ICD-10-CM

## 2014-01-01 DIAGNOSIS — R1010 Upper abdominal pain, unspecified: Secondary | ICD-10-CM

## 2014-01-01 MED ORDER — PANTOPRAZOLE SODIUM 20 MG PO TBEC: 20.0000 mg | DELAYED_RELEASE_TABLET | Freq: Every day | ORAL | Status: DC

## 2014-01-01 NOTE — Progress Notes (Signed)
Patient ID: Carly Rush, female   DOB: 05-27-1991, 23 y.o.   MRN: [id number]  History:  [name] is a 23 y.o. G2P1011 who presents to clinic today for upper abdominal pain. The patient was seen here during her pregnancy and [facility]. She states that since February she has been having sharp upper abdominal pain as often as weekly. The pain has become less frequent lately and she denies any pain yet this month. The patient states that the pain is bilateral in the upper abdomen and sometimes radiates around to her back. She denies N/V, heartburn, change in appetite, fever or UTI symptoms. She does not endorse any known triggers for this pain. She states that when it happens it can last 3-4 hours at a time. She denies HTN, DM or other chronic medical problems. [facility] She also requests STD testing today.   The following portions of the patient's history were reviewed and updated as appropriate: allergies, current medications, past family history, past medical history, past social history, past surgical history and problem list.  Review of Systems:  Pertinent items are noted in HPI.  Objective:  Physical Exam BP 122/80  Pulse 94  Temp(Src) 97.9 F (36.6 C) (Oral)  Ht 5\' 4"  (1.626 m)  Wt 240 lb 6.4 oz (109.045 kg)  BMI 41.24 kg/m2  Breastfeeding? No GENERAL: Well-developed, well-nourished female in no acute distress.  HEENT: Normocephalic, atraumatic. Marland Kitchen.  LUNGS: Normal rate. Clear to auscultation bilaterally.  HEART: Regular rate and rhythm with no adventitious sounds.  ABDOMEN: Soft, nontender, nondistended. No organomegaly. Normal bowel sounds appreciated in all quadrants.  PELVIC: Normal external female genitalia. Vagina is pink and rugated.  Normal discharge. Scant blood noted. Normal cervix contour. IUD strings visualized at appropriate length.  EXTREMITIES: No cyanosis, clubbing, or edema  Labs  and Imaging GC/Chlamydia obtained  Assessment & Plan:  Assessment: Upper abdominal pain possible gastritis or GERD IUD string check  Plans: Rx for Protonix sent to patient's pharmacy Referral to [facility] for upper abdominal pain and to establish primary care provider sent GC/Chlamydia pending Patient to return to [facility] as needed  Freddi StarrJulie N Ethier, PA-C 01/01/2014 2:20 PM

## 2014-01-01 NOTE — Patient Instructions (Signed)
Heartburn Heartburn is a painful, burning sensation in the chest. It may feel worse in certain positions, such as lying down or bending over. It is caused by stomach acid backing up into the tube that carries food from the mouth down to the stomach (lower esophagus).  CAUSES   Large meals.  Certain foods and drinks.  Exercise.  Increased acid production.  Being overweight or obese.  Certain medicines. SYMPTOMS   Burning pain in the chest or lower throat.  Bitter taste in the mouth.  Coughing. DIAGNOSIS  If the usual treatments for heartburn do not improve your symptoms, then tests may be done to see if there is another condition present. Possible tests may include:  X-rays.  Endoscopy. This is when a tube with a light and a camera on the end is used to examine the esophagus and the stomach.  A test to measure the amount of acid in the esophagus (pH test).  A test to see if the esophagus is working properly (esophageal manometry).  Blood, breath, or stool tests to check for bacteria that cause ulcers. TREATMENT   Your caregiver may tell you to use certain over-the-counter medicines (antacids, acid reducers) for mild heartburn.  Your caregiver may prescribe medicines to decrease the acid in your stomach or protect your stomach lining.  Your caregiver may recommend certain diet changes.  For severe cases, your caregiver may recommend that the head of your bed be elevated on blocks. (Sleeping with more pillows is not an effective treatment as it only changes the position of your head and does not improve the main problem of stomach acid refluxing into the esophagus.) HOME CARE INSTRUCTIONS   Take all medicines as directed by your caregiver.  Raise the head of your bed by putting blocks under the legs if instructed to by your caregiver.  Do not exercise right after eating.  Avoid eating 2 or 3 hours before bed. Do not lie down right after eating.  Eat small meals  throughout the day instead of 3 large meals.  Stop smoking if you smoke.  Maintain a healthy weight.  Identify foods and beverages that make your symptoms worse and avoid them. Foods you may want to avoid include:  Peppers.  Chocolate.  High-fat foods, including fried foods.  Spicy foods.  Garlic and onions.  Citrus fruits, including oranges, grapefruit, lemons, and limes.  Food containing tomatoes or tomato products.  Mint.  Carbonated drinks, caffeinated drinks, and alcohol.  Vinegar. SEEK IMMEDIATE MEDICAL CARE IF:  You have severe chest pain that goes down your arm or into your jaw or neck.  You feel sweaty, dizzy, or lightheaded.  You are short of breath.  You vomit blood.  You have difficulty or pain with swallowing.  You have bloody or black, tarry stools.  You have episodes of heartburn more than 3 times a week for more than 2 weeks. MAKE SURE YOU:  Understand these instructions.  Will watch your condition.  Will get help right away if you are not doing well or get worse. Document Released: 01/01/2009 Document Revised: 11/07/2011 Document Reviewed: 01/30/2011 [facility] Patient Information 2014 [employer], [place]. Diet for Gastroesophageal Reflux Disease, Adult Reflux (acid reflux) is when acid from your stomach flows up into the esophagus. When acid comes in contact with the esophagus, the acid causes irritation and soreness (inflammation) in the esophagus. When reflux happens often or so severely that it causes damage to the esophagus, it is called gastroesophageal reflux disease (GERD). Nutrition  therapy can help ease the discomfort of GERD. FOODS OR DRINKS TO AVOID OR LIMIT  Smoking or chewing tobacco. Nicotine is one of the most potent stimulants to acid production in the gastrointestinal tract.  Caffeinated and decaffeinated coffee and black tea.  Regular or low-calorie carbonated beverages or energy drinks (caffeine-free carbonated beverages are  allowed).   Strong spices, such as black pepper, white pepper, red pepper, cayenne, curry powder, and chili powder.  Peppermint or spearmint.  Chocolate.  High-fat foods, including meats and fried foods. Extra added fats including oils, butter, salad dressings, and nuts. Limit these to less than 8 tsp per day.  Fruits and vegetables if they are not tolerated, such as citrus fruits or tomatoes.  Alcohol.  Any food that seems to aggravate your condition. If you have questions regarding your diet, call your caregiver or a registered dietitian. OTHER THINGS THAT MAY HELP GERD INCLUDE:   Eating your meals slowly, in a relaxed setting.  Eating 5 to 6 small meals per day instead of 3 large meals.  Eliminating food for a period of time if it causes distress.  Not lying down until 3 hours after eating a meal.  Keeping the head of your bed raised 6 to 9 inches (15 to 23 cm) by using a foam wedge or blocks under the legs of the bed. Lying flat may make symptoms worse.  Being physically active. Weight loss may be helpful in reducing reflux in overweight or obese adults.  Wear loose fitting clothing EXAMPLE MEAL PLAN This meal plan is approximately 2,000 calories based on https://www.[place].org/ChooseMyPlate.gov meal planning guidelines. Breakfast   cup cooked oatmeal.  1 cup strawberries.  1 cup low-fat milk.  1 oz almonds. Snack  1 cup cucumber slices.  6 oz yogurt (made from low-fat or fat-free milk). Lunch  2 slice whole-wheat bread.  2 oz sliced [place]urkey.  2 tsp mayonnaise.  1 cup blueberries.  1 cup snap peas. Snack  6 whole-wheat crackers.  1 oz string cheese. Dinner   cup brown rice.  1 cup mixed veggies.  1 tsp olive oil.  3 oz grilled fish. Document Released: 08/15/2005 Document Revised: 11/07/2011 Document Reviewed: 07/01/2011 [facility] Patient Information 2014 [place], [place].

## 2014-01-02 LAB — GC/CHLAMYDIA PROBE AMP
CT PROBE, AMP APTIMA: NEGATIVE
GC Probe RNA: NEGATIVE

## 2014-03-16 ENCOUNTER — Other Ambulatory Visit: Payer: Self-pay | Admitting: Medical

## 2014-04-02 ENCOUNTER — Encounter: Payer: Self-pay | Admitting: Obstetrics and Gynecology

## 2014-04-02 ENCOUNTER — Ambulatory Visit (INDEPENDENT_AMBULATORY_CARE_PROVIDER_SITE_OTHER): Payer: Medicaid Other | Admitting: Obstetrics and Gynecology

## 2014-04-02 VITALS — BP 123/67 | HR 71 | Temp 97.4°F | Resp 20 | Ht 65.0 in | Wt 237.9 lb

## 2014-04-02 DIAGNOSIS — Z30432 Encounter for removal of intrauterine contraceptive device: Secondary | ICD-10-CM

## 2014-04-02 NOTE — Progress Notes (Signed)
Pt states her baby dies on 02/12/14 due to unknown cause. She is waiting on autopsy results.  She wants IUD removed because she "feels bad" that she is preventing other pregnancies.

## 2014-04-02 NOTE — Progress Notes (Signed)
Patient ID: [name], female   DOB: [birth year], 23 y.o.   MRN: [id number] 23 yo G2P1011 here for IUD removal. Patient reports losing her 395-month old infant in June 2015. The cause of death remains unknown at this time. Patient desires IUD removal. She desires to try to conceive in the near future. She is not interested in any forms of contraception. Patient is visibly depressed and reports having a good support system. She declined referral to a psychiatrist or grieving group. She denies suicidal or homicidal ideations  IUD Procedure Note Patient identified, informed consent performed, signed copy in chart, time out was performed.  Urine pregnancy test negative.  Speculum placed in the vagina.  Cervix visualized.  IUD strings visualized at the external os and grasped with a ring forceps. The IUD was removed without difficulty. Patient tolerated procedure well.    A/P 23 yo here for IUD removal - IUD removed - Patient advised to start prenatal vitamins as she declined all forms of contraception - RTC prn

## 2014-04-04 ENCOUNTER — Encounter: Payer: Self-pay | Admitting: *Deleted

## 2014-05-13 ENCOUNTER — Telehealth: Payer: Self-pay

## 2014-05-13 NOTE — Telephone Encounter (Signed)
Patient called stating she had her IUD removed 04/02/14 and has still not had a period. Would like to know if she should be concerned or if this is normal. Called patient and asked if she has been been having unprotected sex. Patient states she has and she has taken several home pregnancy tests that are negative. Informed patient that everybody's body is different and its may take a few months for her body to normalize/regulate and have a period. Informed her that not having a period at this time is no reason for concern and to give her body time. However, also explained that she can get pregnant at any time as she is having unprotected sex so she should be mindful of this. Patient verbalized understanding and gratitude. No further questions or concerns.

## 2014-06-30 ENCOUNTER — Encounter: Payer: Self-pay | Admitting: Obstetrics and Gynecology

## 2014-08-14 ENCOUNTER — Ambulatory Visit (INDEPENDENT_AMBULATORY_CARE_PROVIDER_SITE_OTHER): Payer: Medicaid Other | Admitting: Family Medicine

## 2014-08-14 ENCOUNTER — Encounter: Payer: Self-pay | Admitting: Family Medicine

## 2014-08-14 VITALS — BP 118/57 | HR 80 | Temp 98.2°F | Wt 239.6 lb

## 2014-08-14 DIAGNOSIS — N911 Secondary amenorrhea: Secondary | ICD-10-CM

## 2014-08-14 DIAGNOSIS — N926 Irregular menstruation, unspecified: Secondary | ICD-10-CM

## 2014-08-14 LAB — POCT PREGNANCY, URINE: Preg Test, Ur: NEGATIVE

## 2014-08-14 LAB — TSH: TSH: 0.797 u[IU]/mL (ref 0.350–4.500)

## 2014-08-14 NOTE — Progress Notes (Signed)
   Subjective:    Patient ID: Carly Rush, female    DOB: 01-26-1991, 23 y.o.   MRN: [id number]  HPI Patient seen for evaluation of secondary amenorrhea after an IUD was removed 4 months ago.  She had normal periods when she had her mirena, which stopped after she had the IUD removed.  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  Does have minimal bleeding occasionally after having intercourse - perhaps every 10-14 days.     Review of Systems  Constitutional: Negative for fever, chills and fatigue.  Respiratory: Negative for cough, shortness of breath and wheezing.   Cardiovascular: Negative for chest pain, palpitations and leg swelling.  Gastrointestinal: Negative for nausea, vomiting, abdominal pain, diarrhea and constipation.  Endocrine: Negative for cold intolerance, heat intolerance, polydipsia, polyphagia and polyuria.  Genitourinary: Negative for dysuria, urgency, hematuria, vaginal bleeding, vaginal discharge, vaginal pain, pelvic pain and dyspareunia.  Neurological: Negative for dizziness, light-headedness, numbness and headaches.       Objective:   Physical Exam  Constitutional: She is oriented to person, place, and time. She appears well-developed and well-nourished.  HENT:  Head: Normocephalic and atraumatic.  Cardiovascular: Normal rate, regular rhythm and normal heart sounds.  Exam reveals no gallop and no friction rub.   No murmur heard. Pulmonary/Chest: Effort normal and breath sounds normal. No respiratory distress. She has no wheezes. She has no rales. She exhibits no tenderness.  Abdominal: Soft. Bowel sounds are normal. She exhibits no distension and no mass. There is no tenderness. There is no rebound and no guarding.  Neurological: She is alert and oriented to person, place, and time.  Skin: Skin is warm and dry. No rash noted. No erythema. No pallor.  Psychiatric: She has a  normal mood and affect. Her behavior is normal. Judgment and thought content normal.      Assessment & Plan:   Problem List Items Addressed This Visit    None    Visit Diagnoses    Amenorrhea, secondary    -  Primary    Relevant Orders       TSH       Hemoglobin A1c       Prolactin      Discussed with patient that not having period likely related to hormone axis being interrupted with the IUD.  Patient insisted on testing - will check HgA1c, TSH, prolactin.  If this is normal, can try progesterone challenge test.

## 2014-08-14 NOTE — Progress Notes (Signed)
Patient here today with concern that she hasnt had her period since IUD removed in August-- report last period 03/13/14. She is trying to conceive. Most recent pregnancy test two weeks ago was negative.

## 2014-08-15 LAB — HEMOGLOBIN A1C
HEMOGLOBIN A1C: 5.1 % (ref <5.7)
MEAN PLASMA GLUCOSE: 100 mg/dL (ref <117)

## 2014-08-15 LAB — PROLACTIN: Prolactin: 9.9 ng/mL

## 2014-08-18 ENCOUNTER — Telehealth: Payer: Self-pay | Admitting: *Deleted

## 2014-08-18 NOTE — Telephone Encounter (Addendum)
-----   Message from Levie HeritageJacob J Stinson, DO sent at 08/18/2014 11:52 AM EST ----- Please let pt know that her labs were normal.  Called pt's home #  and was unable to leave message because the mailbox is full. I then called the mobile # and a female answered stating that Carly Rush was not there.  Jshaun Abernathy RNC

## 2014-08-19 NOTE — Telephone Encounter (Signed)
Called patient and informed her of results. Patient verbalized understanding and states that she was told if her results were normal he was going to send in a prescription for pills. Told patient I will send the doctor a message and that Rx should go to her pharmacy. Patient verbalized understanding and had no other questions at this time

## 2014-08-20 ENCOUNTER — Other Ambulatory Visit: Payer: Self-pay | Admitting: Family Medicine

## 2014-08-20 MED ORDER — MEDROXYPROGESTERONE ACETATE 10 MG PO TABS: 10.0000 mg | ORAL_TABLET | Freq: Every day | ORAL | Status: DC

## 2014-08-20 NOTE — Telephone Encounter (Signed)
Sent prescription to pharmacy for provera challenge - take 1 tab daily for 2 weeks then stop.  Should have period afterwards.

## 2014-08-20 NOTE — Telephone Encounter (Signed)
Called patient and informed her of Rx sent to pharmacy. Patient verbalized understanding and had no questions 

## 2014-09-16 ENCOUNTER — Encounter: Payer: Self-pay | Admitting: Family Medicine

## 2014-09-29 ENCOUNTER — Encounter: Payer: Self-pay | Admitting: *Deleted

## 2014-10-23 ENCOUNTER — Encounter: Payer: Self-pay | Admitting: Obstetrics & Gynecology

## 2014-10-23 ENCOUNTER — Ambulatory Visit (INDEPENDENT_AMBULATORY_CARE_PROVIDER_SITE_OTHER): Payer: Medicaid Other | Admitting: Obstetrics & Gynecology

## 2014-10-23 VITALS — BP 122/55 | HR 78 | Temp 98.0°F | Ht 65.0 in | Wt 246.1 lb

## 2014-10-23 DIAGNOSIS — N912 Amenorrhea, unspecified: Secondary | ICD-10-CM

## 2014-10-23 LAB — POCT PREGNANCY, URINE: Preg Test, Ur: NEGATIVE

## 2014-10-23 MED ORDER — PRENATAL VITAMINS 0.8 MG PO TABS: 1.0000 | ORAL_TABLET | Freq: Every day | ORAL | Status: DC

## 2014-10-23 NOTE — Progress Notes (Signed)
Patient here today because she is still concerned about not having regular period. Took provera challenge last month and had period on 09/17/14. Has not had a period since.

## 2014-10-23 NOTE — Patient Instructions (Signed)
Primary Amenorrhea  Primary amenorrhea is the absence of any menstrual flow in a female by the age of 15 years. An average age for the start of menstruation is the age of 12 years. Primary amenorrhea is not considered to have occurred until a female is older than 15 years and has never menstruated. This may occur with or without other signs of puberty. CAUSES  Some common causes of not menstruating include:  Chromosomal abnormality causing the ovaries to malfunction is the most common cause of primary amenorrhea.  Malnutrition.  Low blood sugar (hypoglycemia).  Polycystic ovary syndrome (cysts in the ovaries, not ovulating).  Absence of the vagina, uterus, or ovaries since birth (congenital).  Extreme obesity.  Cystic fibrosis.  Drastic weight loss from any cause.  Over-exercising (running, biking) causing loss of body fat.  Pituitary gland tumor in the brain.  Long-term (chronic) illnesses.  Cushing disease.  Thyroid disease (hypothyroidism, hyperthyroidism).  Part of the brain (hypothalamus) not functioning normally.  Premature ovarian failure. SYMPTOMS  No menstruation by age 15 years in normally developed females is the primary symptom. Other symptoms may include:  Discharge from the breasts.  Hot flashes.  Adult acne.  Facial or chest hair.  Headaches.  Impaired vision.  Recent stress.  Changes in weight, diet, or exercise patterns. DIAGNOSIS  Primary amenorrhea is diagnosed with the help of a medical history and a physical exam. Other tests that may be recommended include:  Blood tests to check for pregnancy, hormonal changes, a bleeding or thyroid disorder, low iron levels (anemia), or other problems.  Urine tests.  Specialized X-ray exams. TREATMENT  Treatment will depend on the cause. For example, some of the causes of primary amenorrhea, such as congenital absence of sex organs, will require surgery to correct. Others may respond to treatment  with medicine. SEEK MEDICAL CARE IF:  There has not been any menstrual flow by age 15 years.  Body maturation does not occur at a level typical of peers.  Pelvic area pain occurs.  There is unusual weight gain or hair growth. Document Released: 08/15/2005 Document Revised: 06/05/2013 Document Reviewed: 03/27/2013 ExitCare Patient Information 2015 [employer]. This information is not intended to replace advice given to you by your health care provider. Make sure you discuss any questions you have with your health care provider.  

## 2014-10-23 NOTE — Progress Notes (Signed)
Subjective:     Patient ID: Carly Rush, female   DOB: 10/01/1990, 24 y.o.   MRN: [id number]  HPI  G2P1010 Pt is concerned with lack of menses after the removal of the IUD.  She reports that she had a significant bleed after the Provera challenge given by Dr. Adrian BlackwaterStinson in Dec. Her last pregnancy was from her current partner.  Review of Systems     Objective:   Physical Exam BP 122/55 mmHg  Pulse 78  Temp(Src) 98 F (36.7 C)  Ht 5\' 5"  (1.651 m)  Wt 246 lb 1.6 oz (111.63 kg)  BMI 40.95 kg/m2  Breastfeeding? No Exam deferred     Assessment:     Amenorrhea- desires pregnancy     Plan:     PNV 1 po q day F/u in 6 months to 1 year or sooner prn Rec tobacco cessation Informed pt that we do not do infertility work here Await the return of normal menses.

## 2016-03-30 ENCOUNTER — Inpatient Hospital Stay (HOSPITAL_COMMUNITY)
Admission: AD | Admit: 2016-03-30 | Discharge: 2016-03-31 | Disposition: A | Discharge status: Home / Self Care | Payer: 59 | Source: Ambulatory Visit | Attending: Obstetrics and Gynecology | Admitting: Obstetrics and Gynecology

## 2016-03-30 DIAGNOSIS — N949 Unspecified condition associated with female genital organs and menstrual cycle: Secondary | ICD-10-CM | POA: Diagnosis not present

## 2016-03-30 DIAGNOSIS — K219 Gastro-esophageal reflux disease without esophagitis: Secondary | ICD-10-CM | POA: Diagnosis not present

## 2016-03-30 DIAGNOSIS — O161 Unspecified maternal hypertension, first trimester: Secondary | ICD-10-CM | POA: Diagnosis not present

## 2016-03-30 DIAGNOSIS — O26891 Other specified pregnancy related conditions, first trimester: Secondary | ICD-10-CM | POA: Insufficient documentation

## 2016-03-30 DIAGNOSIS — O99611 Diseases of the digestive system complicating pregnancy, first trimester: Secondary | ICD-10-CM | POA: Diagnosis not present

## 2016-03-30 DIAGNOSIS — Z87891 Personal history of nicotine dependence: Secondary | ICD-10-CM | POA: Insufficient documentation

## 2016-03-30 DIAGNOSIS — R102 Pelvic and perineal pain: Secondary | ICD-10-CM | POA: Insufficient documentation

## 2016-03-30 DIAGNOSIS — O3680X0 Pregnancy with inconclusive fetal viability, not applicable or unspecified: Secondary | ICD-10-CM

## 2016-03-30 DIAGNOSIS — Z3A01 Less than 8 weeks gestation of pregnancy: Secondary | ICD-10-CM | POA: Diagnosis not present

## 2016-03-31 ENCOUNTER — Inpatient Hospital Stay (HOSPITAL_COMMUNITY): Payer: 59

## 2016-03-31 ENCOUNTER — Encounter (HOSPITAL_COMMUNITY): Payer: Self-pay | Admitting: *Deleted

## 2016-03-31 DIAGNOSIS — O26891 Other specified pregnancy related conditions, first trimester: Secondary | ICD-10-CM

## 2016-03-31 DIAGNOSIS — N949 Unspecified condition associated with female genital organs and menstrual cycle: Secondary | ICD-10-CM

## 2016-03-31 LAB — POCT PREGNANCY, URINE: Preg Test, Ur: POSITIVE — AB

## 2016-03-31 LAB — CBC
HEMATOCRIT: 39.4 % (ref 36.0–46.0)
Hemoglobin: 13.7 g/dL (ref 12.0–15.0)
MCH: 27.5 pg (ref 26.0–34.0)
MCHC: 34.8 g/dL (ref 30.0–36.0)
MCV: 79.1 fL (ref 78.0–100.0)
PLATELETS: 234 10*3/uL (ref 150–400)
RBC: 4.98 MIL/uL (ref 3.87–5.11)
RDW: 13.7 % (ref 11.5–15.5)
WBC: 12.3 10*3/uL — AB (ref 4.0–10.5)

## 2016-03-31 LAB — WET PREP, GENITAL
Sperm: NONE SEEN
TRICH WET PREP: NONE SEEN
YEAST WET PREP: NONE SEEN

## 2016-03-31 LAB — URINALYSIS, ROUTINE W REFLEX MICROSCOPIC
Bilirubin Urine: NEGATIVE
Glucose, UA: NEGATIVE mg/dL
Ketones, ur: NEGATIVE mg/dL
NITRITE: NEGATIVE
PH: 6 (ref 5.0–8.0)
Protein, ur: NEGATIVE mg/dL
SPECIFIC GRAVITY, URINE: 1.025 (ref 1.005–1.030)

## 2016-03-31 LAB — URINE MICROSCOPIC-ADD ON

## 2016-03-31 LAB — HIV ANTIBODY (ROUTINE TESTING W REFLEX): HIV Screen 4th Generation wRfx: NONREACTIVE

## 2016-03-31 LAB — RPR: RPR Ser Ql: NONREACTIVE

## 2016-03-31 LAB — HCG, QUANTITATIVE, PREGNANCY: HCG, BETA CHAIN, QUANT, S: 793 m[IU]/mL — AB (ref <5)

## 2016-03-31 LAB — GC/CHLAMYDIA PROBE AMP (~~LOC~~) NOT AT ARMC
Chlamydia: NEGATIVE
Neisseria Gonorrhea: NEGATIVE

## 2016-03-31 NOTE — MAU Provider Note (Signed)
History     CSN: [id number]  Arrival date and time: 03/30/16 2334   First Provider Initiated Contact with Patient 03/31/16 0100      Chief Complaint  Patient presents with  . Pelvic Pain   Pelvic Pain  The patient's primary symptoms include pelvic pain. This is a new problem. Episode onset: 3 days ago. The problem occurs intermittently. The problem has been unchanged. Pain severity now: 5/10  The problem affects both sides. She is pregnant. Associated symptoms include abdominal pain and headaches. Pertinent negatives include no chills, constipation, diarrhea, dysuria, fever, frequency, nausea, urgency or vomiting. The vaginal discharge was normal. The vaginal bleeding is spotting (7/28 had some spotting. None now. ). Nothing aggravates the symptoms. She has tried nothing for the symptoms. Menstrual history: unsure. Had spotting in early June and early July. States that until June periods were about every 26 days.     Past Medical History:  Diagnosis Date  . Asthma   . Chlamydia 2011  . Gastric reflux   . Gonorrhea 2011  . Headache(784.0)   . Hypertension   . Von Willebrand disease (HCC)     Past Surgical History:  Procedure Laterality Date  . INDUCED ABORTION    . TONSILLECTOMY      Family History  Problem Relation Age of Onset  . Cancer Mother     cervical  . Hypertension Father   . Diabetes Brother   . Anesthesia problems Neg Hx     Social History  Substance Use Topics  . Smoking status: Former Smoker    Years: 5.00    Types: Cigarettes    Quit date: 08/31/2014  . Smokeless tobacco: Never Used  . Alcohol use No     Comment: 3-4 drinks occasionally    Allergies: No Known Allergies  Prescriptions Prior to Admission  Medication Sig Dispense Refill Last Dose  . medroxyPROGESTERone (PROVERA) 10 MG tablet Take 1 tablet (10 mg total) by mouth daily. (Patient not taking: Reported on 10/23/2014) 14 tablet 0 Not Taking  . pantoprazole (PROTONIX) 20 MG tablet TAKE 1  TABLET BY MOUTH EVERY DAY (Patient not taking: Reported on 10/23/2014) 30 tablet 1 More than a month at Unknown time  . Prenatal Multivit-Min-Fe-FA (PRENATAL VITAMINS) 0.8 MG tablet Take 1 tablet by mouth daily. 30 tablet 12     Review of Systems  Constitutional: Negative for chills and fever.  Gastrointestinal: Positive for abdominal pain. Negative for constipation, diarrhea, nausea and vomiting.  Genitourinary: Positive for pelvic pain. Negative for dysuria, frequency and urgency.  Neurological: Positive for headaches.   Physical Exam   Blood pressure 148/73, pulse 95, temperature 98.7 F (37.1 C), temperature source Oral, resp. rate 20, height 5\' 4"  (1.626 m), weight 263 lb 6 oz (119.5 kg), SpO2 100 %.  Physical Exam  Nursing note and vitals reviewed. Constitutional: She is oriented to person, place, and time. She appears well-developed and well-nourished. No distress.  HENT:  Head: Normocephalic.  Cardiovascular: Normal rate.   Respiratory: Effort normal.  GI: Soft. There is no tenderness. There is no rebound.  Neurological: She is alert and oriented to person, place, and time.  Skin: Skin is warm and dry.  Psychiatric: She has a normal mood and affect.   US Ob Comp Less 14 Wks  Result Date: 03/31/2016 CLINICAL DATA:  25 year old pregnant female with lower abdominal pain and spotting EXAM: OBSTETRIC <14 WK [place] AND TRANSVAGINAL OB US TECHNIQUE: Both transabdominal and transvaginal ultrasound examinations were performed for  complete evaluation of the gestation as well as the maternal uterus, adnexal regions, and pelvic cul-de-sac. Transvaginal technique was performed to assess early pregnancy. COMPARISON:  None. FINDINGS: The uterus is anteverted and appears unremarkable. The endometrium is thickened measuring 18 mm. A cystic structure with possible early surrounding decidual reaction noted in the upper endometrial canal. No yolk sac or fetal pole identified within this cystic structure.  This may represent an early gestational sac, and less likely a blighted ovum, or an endometrial cyst. Pseudo gestation of an ectopic pregnancy is not entirely excluded. Correlation with clinical exam and follow-up with serial HCG levels and ultrasound recommended. If this cystic structure is a true gestational sac the estimated gestational age based on mean sac diameter of 3 mm is 5 weeks, 0 days. No subchorionic hemorrhage identified. The maternal ovaries appear unremarkable. There is a 2.3 x 2.5 x 2.6 cm cyst in the left ovary. No free fluid within the pelvis. IMPRESSION: Small cystic structure within the endometrial canal as described. Follow-up with serial HCG levels and ultrasound recommended. Electronically Signed   By: Elgie Collard M.D.   On: 03/31/2016 01:53   US Ob Transvaginal  Result Date: 03/31/2016 CLINICAL DATA:  25 year old pregnant female with lower abdominal pain and spotting EXAM: OBSTETRIC <14 WK [place] AND TRANSVAGINAL OB US TECHNIQUE: Both transabdominal and transvaginal ultrasound examinations were performed for complete evaluation of the gestation as well as the maternal uterus, adnexal regions, and pelvic cul-de-sac. Transvaginal technique was performed to assess early pregnancy. COMPARISON:  None. FINDINGS: The uterus is anteverted and appears unremarkable. The endometrium is thickened measuring 18 mm. A cystic structure with possible early surrounding decidual reaction noted in the upper endometrial canal. No yolk sac or fetal pole identified within this cystic structure. This may represent an early gestational sac, and less likely a blighted ovum, or an endometrial cyst. Pseudo gestation of an ectopic pregnancy is not entirely excluded. Correlation with clinical exam and follow-up with serial HCG levels and ultrasound recommended. If this cystic structure is a true gestational sac the estimated gestational age based on mean sac diameter of 3 mm is 5 weeks, 0 days. No subchorionic  hemorrhage identified. The maternal ovaries appear unremarkable. There is a 2.3 x 2.5 x 2.6 cm cyst in the left ovary. No free fluid within the pelvis. IMPRESSION: Small cystic structure within the endometrial canal as described. Follow-up with serial HCG levels and ultrasound recommended. Electronically Signed   By: Elgie Collard M.D.   On: 03/31/2016 01:53   Results for orders placed or performed during the hospital encounter of 03/30/16 (from the past 24 hour(s))  Urinalysis, Routine w reflex microscopic (not at [facility])     Status: Abnormal   Collection Time: 03/30/16 11:54 PM  Result Value Ref Range   Color, Urine YELLOW YELLOW   APPearance CLEAR CLEAR   Specific Gravity, Urine 1.025 1.005 - 1.030   pH 6.0 5.0 - 8.0   Glucose, UA NEGATIVE NEGATIVE mg/dL   Hgb urine dipstick TRACE (A) NEGATIVE   Bilirubin Urine NEGATIVE NEGATIVE   Ketones, ur NEGATIVE NEGATIVE mg/dL   Protein, ur NEGATIVE NEGATIVE mg/dL   Nitrite NEGATIVE NEGATIVE   Leukocytes, UA TRACE (A) NEGATIVE  Urine microscopic-add on     Status: Abnormal   Collection Time: 03/30/16 11:54 PM  Result Value Ref Range   Squamous Epithelial / LPF 6-30 (A) NONE SEEN   WBC, UA 0-5 0 - 5 WBC/hpf   RBC / HPF 0-5 0 -  5 RBC/hpf   Bacteria, UA FEW (A) NONE SEEN  Pregnancy, urine POC     Status: Abnormal   Collection Time: 03/31/16 12:03 AM  Result Value Ref Range   Preg Test, Ur POSITIVE (A) NEGATIVE  Wet prep, genital     Status: Abnormal   Collection Time: 03/31/16 12:40 AM  Result Value Ref Range   Yeast Wet Prep HPF POC NONE SEEN NONE SEEN   Trich, Wet Prep NONE SEEN NONE SEEN   Clue Cells Wet Prep HPF POC PRESENT (A) NONE SEEN   WBC, Wet Prep HPF POC MODERATE (A) NONE SEEN   Sperm NONE SEEN   hCG, quantitative, pregnancy     Status: Abnormal   Collection Time: 03/31/16  1:10 AM  Result Value Ref Range   hCG, Beta Chain, Quant, S 793 (H) <5 mIU/mL  CBC     Status: Abnormal   Collection Time: 03/31/16  1:10 AM  Result  Value Ref Range   WBC 12.3 (H) 4.0 - 10.5 K/uL   RBC 4.98 3.87 - 5.11 MIL/uL   Hemoglobin 13.7 12.0 - 15.0 g/dL   HCT 16.1 09.6 - 04.5 %   MCV 79.1 78.0 - 100.0 fL   MCH 27.5 26.0 - 34.0 pg   MCHC 34.8 30.0 - 36.0 g/dL   RDW 40.9 81.1 - 91.4 %   Platelets 234 150 - 400 K/uL    MAU Course  Procedures  MDM  Assessment and Plan   1. Pregnancy, location unknown   2. Pelvic pain in pregnancy, antepartum, first trimester    DC home Comfort measures reviewed  1st  Trimester precautions  Bleeding precautions Ectopic precautions NW:GNFA Return to MAU as needed FU with OB as planned  Follow-up Information    [facility] [place] [facility] ADMISS[facility] .   Why:  Saturday morning for repeat blood work  Contact information: [address] [id number] [place] [address] [phone]           Tawnya Crook 03/31/2016, 1:01 AM

## 2016-03-31 NOTE — MAU Note (Signed)
Pt reports taking plan B July 19 8 hours after having sex.

## 2016-03-31 NOTE — MAU Note (Signed)
Pt c/o lower abdominal pain and HA for one month. States she had some spotting on 01/29/2016, but did not have a period in July. Had some spotting on 7/28 for 4-5 hours but that was it.

## 2016-03-31 NOTE — Discharge Instructions (Signed)

## 2016-04-02 ENCOUNTER — Encounter (HOSPITAL_COMMUNITY): Payer: Self-pay

## 2016-04-02 ENCOUNTER — Inpatient Hospital Stay (HOSPITAL_COMMUNITY)
Admission: AD | Admit: 2016-04-02 | Discharge: 2016-04-02 | Disposition: A | Discharge status: Home / Self Care | Payer: 59 | Source: Ambulatory Visit | Attending: Obstetrics | Admitting: Obstetrics

## 2016-04-02 DIAGNOSIS — O26891 Other specified pregnancy related conditions, first trimester: Secondary | ICD-10-CM | POA: Insufficient documentation

## 2016-04-02 DIAGNOSIS — O9989 Other specified diseases and conditions complicating pregnancy, childbirth and the puerperium: Secondary | ICD-10-CM

## 2016-04-02 DIAGNOSIS — R109 Unspecified abdominal pain: Secondary | ICD-10-CM

## 2016-04-02 DIAGNOSIS — O26899 Other specified pregnancy related conditions, unspecified trimester: Secondary | ICD-10-CM

## 2016-04-02 DIAGNOSIS — Z3A01 Less than 8 weeks gestation of pregnancy: Secondary | ICD-10-CM | POA: Insufficient documentation

## 2016-04-02 LAB — HCG, QUANTITATIVE, PREGNANCY: HCG, BETA CHAIN, QUANT, S: 2059 m[IU]/mL — AB (ref <5)

## 2016-04-02 NOTE — MAU Provider Note (Signed)
[name]  is a 25 y.o. G3P1011 at [redacted]w[redacted]d who presents to the [facility] today for follow-up quant hCG after 48 hours.   The patient was seen in MAU on 03/31/16  quant hCG of 793  US showed possible gestational sac.    She denies pain Denies vaginal bleeding  Denies fever    OB History  Gravida Para Term Preterm AB Living  3 1 1  0 1 1  SAB TAB Ectopic Multiple Live Births  0 1 0 0 1    # Outcome Date GA Lbr Len/2nd Weight Sex Delivery Anes PTL Lv  3 Current           2 Term 05/29/13 [redacted]w[redacted]d / 01:14 6 lb 15.1 oz (3.15 kg) M Vag-Spont EPI  LIV     Birth Comments: baby died @ age 48 mos. of unknown cause  1 TAB               Past Medical History:  Diagnosis Date  . Asthma   . Chlamydia 2011  . Gastric reflux   . Gonorrhea 2011  . Headache(784.0)   . Hypertension   . Von Willebrand disease (HCC)      BP 140/79 (BP Location: Right Arm)   Pulse 107   Temp 97.9 F (36.6 C)   Resp 18   LMP 02/28/2016 Comment: spotted july 2 for 2 days and july 28 for 4 hrs  CONSTITUTIONAL: Well-developed, well-nourished female in no acute distress.  MUSCULOSKELETAL: Normal range of motion.  CARDIOVASCULAR: Regular heart rate RESPIRATORY: Normal effort NEUROLOGICAL: Alert and oriented to person, place, and time.  SKIN: Skin is warm and dry. No rash noted. Not diaphoretic. No erythema. No pallor. PSYCH: Normal mood and affect. Normal behavior. Normal judgment and thought content.    Ref. Range 03/31/2016 01:10  HCG, Beta Chain, Quant, S Latest Ref Range: <5 mIU/mL 793 (H)    Ref. Range 04/02/2016 10:23  HCG, Beta Chain, Quant, S Latest Ref Range: <5 mIU/mL 2,059 (H)   A: Appropriate rise in quant hCG after 48 hours  P: Discharge home First trimester/ectopic precautions discussed Consulted Dr Chestine Spore. Will follow up in office for [place] as determined by on call MD Patient may return to MAU as needed or if her condition were to change or worsen   Aviva Signs,  CNM 04/02/2016 10:40 AM

## 2016-04-02 NOTE — Discharge Instructions (Signed)
First Trimester of Pregnancy The first trimester of pregnancy is from week 1 until the end of week 12 (months 1 through 3). A week after a sperm fertilizes an egg, the egg will implant on the wall of the uterus. This embryo will begin to develop into a baby. Genes from you and your partner are forming the baby. The female genes determine whether the baby is a boy or a girl. At 6-8 weeks, the eyes and face are formed, and the heartbeat can be seen on ultrasound. At the end of 12 weeks, all the baby's organs are formed.  Now that you are pregnant, you will want to do everything you can to have a healthy baby. Two of the most important things are to get good prenatal care and to follow your health care provider's instructions. Prenatal care is all the medical care you receive before the baby's birth. This care will help prevent, find, and treat any problems during the pregnancy and childbirth. BODY CHANGES Your body goes through many changes during pregnancy. The changes vary from woman to woman.   You may gain or lose a couple of pounds at first.  You may feel sick to your stomach (nauseous) and throw up (vomit). If the vomiting is uncontrollable, call your health care provider.  You may tire easily.  You may develop headaches that can be relieved by medicines approved by your health care provider.  You may urinate more often. Painful urination may mean you have a bladder infection.  You may develop heartburn as a result of your pregnancy.  You may develop constipation because certain hormones are causing the muscles that push waste through your intestines to slow down.  You may develop hemorrhoids or swollen, bulging veins (varicose veins).  Your breasts may begin to grow larger and become tender. Your nipples may stick out more, and the tissue that surrounds them (areola) may become darker.  Your gums may bleed and may be sensitive to brushing and flossing.  Dark spots or blotches (chloasma,  mask of pregnancy) may develop on your face. This will likely fade after the baby is born.  Your menstrual periods will stop.  You may have a loss of appetite.  You may develop cravings for certain kinds of food.  You may have changes in your emotions from day to day, such as being excited to be pregnant or being concerned that something may go wrong with the pregnancy and baby.  You may have more vivid and strange dreams.  You may have changes in your hair. These can include thickening of your hair, rapid growth, and changes in texture. Some women also have hair loss during or after pregnancy, or hair that feels dry or thin. Your hair will most likely return to normal after your baby is born. WHAT TO EXPECT AT YOUR PRENATAL VISITS During a routine prenatal visit:  You will be weighed to make sure you and the baby are growing normally.  Your blood pressure will be taken.  Your abdomen will be measured to track your baby's growth.  The fetal heartbeat will be listened to starting around week 10 or 12 of your pregnancy.  Test results from any previous visits will be discussed. Your health care provider may ask you:  How you are feeling.  If you are feeling the baby move.  If you have had any abnormal symptoms, such as leaking fluid, bleeding, severe headaches, or abdominal cramping.  If you are using any tobacco products,   including cigarettes, chewing tobacco, and electronic cigarettes.  If you have any questions. Other tests that may be performed during your first trimester include:  Blood tests to find your blood type and to check for the presence of any previous infections. They will also be used to check for low iron levels (anemia) and Rh antibodies. Later in the pregnancy, blood tests for diabetes will be done along with other tests if problems develop.  Urine tests to check for infections, diabetes, or protein in the urine.  An ultrasound to confirm the proper growth  and development of the baby.  An amniocentesis to check for possible genetic problems.  Fetal screens for spina bifida and Down syndrome.  You may need other tests to make sure you and the baby are doing well.  HIV (human immunodeficiency virus) testing. Routine prenatal testing includes screening for HIV, unless you choose not to have this test. HOME CARE INSTRUCTIONS  Medicines  Follow your health care provider's instructions regarding medicine use. Specific medicines may be either safe or unsafe to take during pregnancy.  Take your prenatal vitamins as directed.  If you develop constipation, try taking a stool softener if your health care provider approves. Diet  Eat regular, well-balanced meals. Choose a variety of foods, such as meat or vegetable-based protein, fish, milk and low-fat dairy products, vegetables, fruits, and whole grain breads and cereals. Your health care provider will help you determine the amount of weight gain that is right for you.  Avoid raw meat and uncooked cheese. These carry germs that can cause birth defects in the baby.  Eating four or five small meals rather than three large meals a day may help relieve nausea and vomiting. If you start to feel nauseous, eating a few soda crackers can be helpful. Drinking liquids between meals instead of during meals also seems to help nausea and vomiting.  If you develop constipation, eat more high-fiber foods, such as fresh vegetables or fruit and whole grains. Drink enough fluids to keep your urine clear or pale yellow. Activity and Exercise  Exercise only as directed by your health care provider. Exercising will help you:  Control your weight.  Stay in shape.  Be prepared for labor and delivery.  Experiencing pain or cramping in the lower abdomen or low back is a good sign that you should stop exercising. Check with your health care provider before continuing normal exercises.  Try to avoid standing for long  periods of time. Move your legs often if you must stand in one place for a long time.  Avoid heavy lifting.  Wear low-heeled shoes, and practice good posture.  You may continue to have sex unless your health care provider directs you otherwise. Relief of Pain or Discomfort  Wear a good support bra for breast tenderness.   Take warm sitz baths to soothe any pain or discomfort caused by hemorrhoids. Use hemorrhoid cream if your health care provider approves.   Rest with your legs elevated if you have leg cramps or low back pain.  If you develop varicose veins in your legs, wear support hose. Elevate your feet for 15 minutes, 3-4 times a day. Limit salt in your diet. Prenatal Care  Schedule your prenatal visits by the twelfth week of pregnancy. They are usually scheduled monthly at first, then more often in the last 2 months before delivery.  Write down your questions. Take them to your prenatal visits.  Keep all your prenatal visits as directed by your   health care provider. Safety  Wear your seat belt at all times when driving.  Make a list of emergency phone numbers, including numbers for family, friends, the hospital, and police and fire departments. General Tips  Ask your health care provider for a referral to a local prenatal education class. Begin classes no later than at the beginning of month 6 of your pregnancy.  Ask for help if you have counseling or nutritional needs during pregnancy. Your health care provider can offer advice or refer you to specialists for help with various needs.  Do not use hot tubs, steam rooms, or saunas.  Do not douche or use tampons or scented sanitary pads.  Do not cross your legs for long periods of time.  Avoid cat litter boxes and soil used by cats. These carry germs that can cause birth defects in the baby and possibly loss of the fetus by miscarriage or stillbirth.  Avoid all smoking, herbs, alcohol, and medicines not prescribed by  your health care provider. Chemicals in these affect the formation and growth of the baby.  Do not use any tobacco products, including cigarettes, chewing tobacco, and electronic cigarettes. If you need help quitting, ask your health care provider. You may receive counseling support and other resources to help you quit.  Schedule a dentist appointment. At home, brush your teeth with a soft toothbrush and be gentle when you floss. SEEK MEDICAL CARE IF:   You have dizziness.  You have mild pelvic cramps, pelvic pressure, or nagging pain in the abdominal area.  You have persistent nausea, vomiting, or diarrhea.  You have a bad smelling vaginal discharge.  You have pain with urination.  You notice increased swelling in your face, hands, legs, or ankles. SEEK IMMEDIATE MEDICAL CARE IF:   You have a fever.  You are leaking fluid from your vagina.  You have spotting or bleeding from your vagina.  You have severe abdominal cramping or pain.  You have rapid weight gain or loss.  You vomit blood or material that looks like coffee grounds.  You are exposed to German measles and have never had them.  You are exposed to fifth disease or chickenpox.  You develop a severe headache.  You have shortness of breath.  You have any kind of trauma, such as from a fall or a car accident.   This information is not intended to replace advice given to you by your health care provider. Make sure you discuss any questions you have with your health care provider.   Document Released: 08/09/2001 Document Revised: 09/05/2014 Document Reviewed: 06/25/2013 Else[employer] Interactive Patient Education 2016 [employer]  

## 2016-04-04 ENCOUNTER — Telehealth: Payer: Self-pay | Admitting: *Deleted

## 2016-04-04 NOTE — Telephone Encounter (Signed)
Discussed patient call and reviewed chart with Dr. Debroah LoopArnold. Instructed to call Dr. Ophelia Charterlark's office [facility]( [facility] ) and let Dr. Chestine Sporelark know that patient is unable to get into office for ultrasound and bhcg ordered by Dr. Chestine Sporelark.  I called [facility] and spoke with Dr. Ophelia Charterlark's nurse [facility] and left the message with her.  She states she will send a message to Dr. Chestine Sporelark who will be in tomorrow.   I called Carly Rush and notified her that I have left a message with Dr. Chestine Sporelark and they should call her within a few days. I advised her to call their office if she does not hear back.. I also [facility] if she has severe pain or heavy bleeding. She voices understanding.

## 2016-04-04 NOTE — Telephone Encounter (Signed)
Received a phone call from Cheri GuppyCorina stating she was seen in MAU and is supposed to get a follow up ultrasound.  Per chart review is listed under Dr. Dareen PianoAnderson and Dr. Chestine Sporelark [facility]. I explained to her she needs to get follow up with them if that is where she is going for her pregnancy care. She states she plans to go there, but she called the office and because she owes a balance they will not make appointment until she pays it.  I explained to her I will need to discuss with my providers to see if they recommend an ultraosound or just bhcg since she had appropriate rise in bhcg and then I will call her back. I explained I cannot operate under orders from Dr. Chestine Sporelark or Dr. Dareen PianoAnderson. She voices understanding.

## 2016-05-27 LAB — OB RESULTS CONSOLE HEPATITIS B SURFACE ANTIGEN: Hepatitis B Surface Ag: NEGATIVE

## 2016-05-27 LAB — OB RESULTS CONSOLE RUBELLA ANTIBODY, IGM: Rubella: IMMUNE

## 2016-06-21 ENCOUNTER — Other Ambulatory Visit: Payer: Self-pay | Admitting: Obstetrics and Gynecology

## 2016-08-29 NOTE — L&D Delivery Note (Signed)
Patient was C/C/+4 and pushed for 10 minutes with epidural.   NSVD  female infant, Apgars 8,9, weight P.   The patient had a subclitoral first degree laceration repaired with 3-0 vicryl. Fundus was firm. EBL was expected amount. Placenta was delivered intact. Vagina was clear.  Baby was vigorous and doing skin to skin with mother.  Meade Hogeland A

## 2016-11-01 ENCOUNTER — Encounter (HOSPITAL_COMMUNITY): Payer: Self-pay | Admitting: *Deleted

## 2016-11-01 ENCOUNTER — Inpatient Hospital Stay (HOSPITAL_COMMUNITY)
Admission: AD | Admit: 2016-11-01 | Discharge: 2016-11-01 | Disposition: A | Discharge status: Home / Self Care | Payer: 59 | Source: Ambulatory Visit | Attending: Obstetrics & Gynecology | Admitting: Obstetrics & Gynecology

## 2016-11-01 DIAGNOSIS — O163 Unspecified maternal hypertension, third trimester: Secondary | ICD-10-CM

## 2016-11-01 DIAGNOSIS — Z87891 Personal history of nicotine dependence: Secondary | ICD-10-CM | POA: Insufficient documentation

## 2016-11-01 DIAGNOSIS — Z3A35 35 weeks gestation of pregnancy: Secondary | ICD-10-CM | POA: Insufficient documentation

## 2016-11-01 DIAGNOSIS — R030 Elevated blood-pressure reading, without diagnosis of hypertension: Secondary | ICD-10-CM | POA: Diagnosis present

## 2016-11-01 DIAGNOSIS — O133 Gestational [pregnancy-induced] hypertension without significant proteinuria, third trimester: Secondary | ICD-10-CM | POA: Insufficient documentation

## 2016-11-01 LAB — URINALYSIS, ROUTINE W REFLEX MICROSCOPIC
Bilirubin Urine: NEGATIVE
Glucose, UA: NEGATIVE mg/dL
Hgb urine dipstick: NEGATIVE
Ketones, ur: NEGATIVE mg/dL
NITRITE: NEGATIVE
Protein, ur: NEGATIVE mg/dL
SPECIFIC GRAVITY, URINE: 1.017 (ref 1.005–1.030)
pH: 8 (ref 5.0–8.0)

## 2016-11-01 LAB — PROTEIN / CREATININE RATIO, URINE
CREATININE, URINE: 83 mg/dL
Protein Creatinine Ratio: 0.12 mg/mg{Cre} (ref 0.00–0.15)
Total Protein, Urine: 10 mg/dL

## 2016-11-01 LAB — CBC
HEMATOCRIT: 36.2 % (ref 36.0–46.0)
Hemoglobin: 12 g/dL (ref 12.0–15.0)
MCH: 26 pg (ref 26.0–34.0)
MCHC: 33.1 g/dL (ref 30.0–36.0)
MCV: 78.4 fL (ref 78.0–100.0)
Platelets: 189 10*3/uL (ref 150–400)
RBC: 4.62 MIL/uL (ref 3.87–5.11)
RDW: 15.7 % — AB (ref 11.5–15.5)
WBC: 11 10*3/uL — ABNORMAL HIGH (ref 4.0–10.5)

## 2016-11-01 LAB — COMPREHENSIVE METABOLIC PANEL
ALT: 13 U/L — AB (ref 14–54)
AST: 17 U/L (ref 15–41)
Albumin: 3.2 g/dL — ABNORMAL LOW (ref 3.5–5.0)
Alkaline Phosphatase: 126 U/L (ref 38–126)
Anion gap: 9 (ref 5–15)
BILIRUBIN TOTAL: 0.5 mg/dL (ref 0.3–1.2)
BUN: 8 mg/dL (ref 6–20)
CALCIUM: 8.4 mg/dL — AB (ref 8.9–10.3)
CO2: 19 mmol/L — ABNORMAL LOW (ref 22–32)
CREATININE: 0.42 mg/dL — AB (ref 0.44–1.00)
Chloride: 106 mmol/L (ref 101–111)
GFR calc Af Amer: >60 mL/min (ref >60)
Glucose, Bld: 86 mg/dL (ref 65–99)
POTASSIUM: 3.9 mmol/L (ref 3.5–5.1)
Sodium: 134 mmol/L — ABNORMAL LOW (ref 135–145)
TOTAL PROTEIN: 6.9 g/dL (ref 6.5–8.1)

## 2016-11-01 MED ORDER — ACETAMINOPHEN 500 MG PO TABS
1000.0000 mg | ORAL_TABLET | Freq: Once | ORAL | Status: AC
  Administered 2016-11-01: 1000 mg via ORAL
  Filled 2016-11-01: qty 2

## 2016-11-01 NOTE — MAU Provider Note (Signed)
Chief Complaint:  Hypertension   First Provider Initiated Contact with Patient 11/01/16 1108      HPI: Carly Rush is a 26 y.o. G3P1011 at [redacted]w[redacted]d who presents to maternity admissions reporting elevated BP x 2 at [facility] 2 days ago so she called her office and was sent here for evaluation.  She has not tried any treatments for BP and does not have hx of HTN in this pregnancy.  She does report some elevated BP in early pregnancy with her previous pregnancy but is unsure if she took medication at that time.  She also reports mild h/a with onset today described as constant, starting in the occipital area and radiating around to the frontal area. She has not tried any treatments for h/a.  She has also seen spots x 2 episodes today, one while standing to get into the shower, and one while standing in the bathroom in MAU.  She denies epigastric pain.  She reports good fetal movement, denies LOF, vaginal bleeding, vaginal itching/burning, urinary symptoms, dizziness, n/v, or fever/chills.    HPI  Past Medical History: Past Medical History:  Diagnosis Date  . Asthma   . Chlamydia 2011  . Gastric reflux   . Gonorrhea 2011  . Headache(784.0)   . Hypertension   . Von Willebrand disease (HCC)     Past obstetric history: OB History  Gravida Para Term Preterm AB Living  3 1 1  0 1 1  SAB TAB Ectopic Multiple Live Births  0 1 0 0 1    # Outcome Date GA Lbr Len/2nd Weight Sex Delivery Anes PTL Lv  3 Current           2 Term 05/29/13 [redacted]w[redacted]d / 01:14 6 lb 15.1 oz (3.15 kg) M Vag-Spont EPI  LIV     Birth Comments: baby died @ age 66 mos. of unknown cause  1 TAB               Past Surgical History: Past Surgical History:  Procedure Laterality Date  . INDUCED ABORTION    . TONSILLECTOMY      Family History: Family History  Problem Relation Age of Onset  . Cancer Mother     cervical  . Hypertension Father   . Diabetes Brother   . Anesthesia problems Neg Hx     Social History: Social  History  Substance Use Topics  . Smoking status: Former Smoker    Years: 5.00    Types: Cigarettes    Quit date: 08/31/2014  . Smokeless tobacco: Never Used  . Alcohol use No     Comment: 3-4 drinks occasionally    Allergies: No Known Allergies  Meds:  Prescriptions Prior to Admission  Medication Sig Dispense Refill Last Dose  . calcium carbonate (TUMS - DOSED IN MG ELEMENTAL CALCIUM) 500 MG chewable tablet Chew 1 tablet by mouth as needed for indigestion or heartburn.   10/31/2016 at Unknown time    ROS:  Review of Systems  Constitutional: Negative for chills, fatigue and fever.  Eyes: Negative for visual disturbance.  Respiratory: Negative for shortness of breath.   Cardiovascular: Negative for chest pain.  Gastrointestinal: Negative for abdominal pain, nausea and vomiting.  Genitourinary: Negative for difficulty urinating, dysuria, flank pain, pelvic pain, vaginal bleeding, vaginal discharge and vaginal pain.  Neurological: Positive for headaches. Negative for dizziness.  Psychiatric/Behavioral: Negative.      I have reviewed patient's Past Medical Hx, Surgical Hx, Family Hx, Social Hx, medications and allergies.  Physical Exam   Patient Vitals for the past 24 hrs:  BP Temp Pulse Resp SpO2 Height Weight  11/01/16 1332 129/66 - 81 - - - -  11/01/16 1317 122/68 - 81 - - - -  11/01/16 1302 128/73 - 84 - - - -  11/01/16 1132 135/66 - 96 - - - -  11/01/16 1116 135/73 - 99 - - - -  11/01/16 1107 137/70 - - - - - -  11/01/16 1038 - - - - 99 % - -  11/01/16 1035 151/85 98.2 F (36.8 C) 102 18 - 5\' 3"  (1.6 m) 273 lb (123.8 kg)   Constitutional: Well-developed, well-nourished female in no acute distress.  Cardiovascular: normal rate Respiratory: normal effort GI: Abd soft, non-tender, gravid appropriate for gestational age.  MS: Extremities nontender, no edema, normal ROM Neurologic: Alert and oriented x 4.  GU: Neg CVAT.    FHT:  Baseline 135 , moderate variability,  accelerations present, no decelerations Contractions: None on toco or to palpation   Labs: Results for orders placed or performed during the hospital encounter of 11/01/16 (from the past 24 hour(s))  Urinalysis, Routine w reflex microscopic     Status: Abnormal   Collection Time: 11/01/16 10:40 AM  Result Value Ref Range   Color, Urine YELLOW YELLOW   APPearance HAZY (A) CLEAR   Specific Gravity, Urine 1.017 1.005 - 1.030   pH 8.0 5.0 - 8.0   Glucose, UA NEGATIVE NEGATIVE mg/dL   Hgb urine dipstick NEGATIVE NEGATIVE   Bilirubin Urine NEGATIVE NEGATIVE   Ketones, ur NEGATIVE NEGATIVE mg/dL   Protein, ur NEGATIVE NEGATIVE mg/dL   Nitrite NEGATIVE NEGATIVE   Leukocytes, UA MODERATE (A) NEGATIVE   RBC / HPF 0-5 0 - 5 RBC/hpf   WBC, UA 6-30 0 - 5 WBC/hpf   Bacteria, UA RARE (A) NONE SEEN   Squamous Epithelial / LPF 6-30 (A) NONE SEEN   Mucous PRESENT   Protein / creatinine ratio, urine     Status: None   Collection Time: 11/01/16 10:40 AM  Result Value Ref Range   Creatinine, Urine 83.00 mg/dL   Total Protein, Urine 10 mg/dL   Protein Creatinine Ratio 0.12 0.00 - 0.15 mg/mg[Cre]  CBC     Status: Abnormal   Collection Time: 11/01/16 10:51 AM  Result Value Ref Range   WBC 11.0 (H) 4.0 - 10.5 K/uL   RBC 4.62 3.87 - 5.11 MIL/uL   Hemoglobin 12.0 12.0 - 15.0 g/dL   HCT 16.136.2 09.636.0 - 04.546.0 %   MCV 78.4 78.0 - 100.0 fL   MCH 26.0 26.0 - 34.0 pg   MCHC 33.1 30.0 - 36.0 g/dL   RDW 40.915.7 (H) 81.111.5 - 91.415.5 %   Platelets 189 150 - 400 K/uL  Comprehensive metabolic panel     Status: Abnormal   Collection Time: 11/01/16 10:51 AM  Result Value Ref Range   Sodium 134 (L) 135 - 145 mmol/L   Potassium 3.9 3.5 - 5.1 mmol/L   Chloride 106 101 - 111 mmol/L   CO2 19 (L) 22 - 32 mmol/L   Glucose, Bld 86 65 - 99 mg/dL   BUN 8 6 - 20 mg/dL   Creatinine, Ser 7.820.42 (L) 0.44 - 1.00 mg/dL   Calcium 8.4 (L) 8.9 - 10.3 mg/dL   Total Protein 6.9 6.5 - 8.1 g/dL   Albumin 3.2 (L) 3.5 - 5.0 g/dL   AST 17 15  - 41 U/L   ALT 13 (L)  14 - 54 U/L   Alkaline Phosphatase 126 38 - 126 U/L   Total Bilirubin 0.5 0.3 - 1.2 mg/dL   GFR calc non Af Amer >60 >60 mL/min   GFR calc Af Amer >60 >60 mL/min   Anion gap 9 5 - 15      Imaging:  No results found.  MAU Course/MDM: I have ordered labs and reviewed results.  NST reviewed Tylenol 1000 mg PO given, h/a fully resolved. No more visual changes while in MAU. No evidence of preeclampsia today Consult Dr Mora Appl with presentation, exam findings and test results.  Pt to f/u with NST/OB visit on Friday in office Preeclampsia precautions reviewed Pt stable at time of discharge.  Assessment: 1. Hypertension affecting pregnancy in third trimester     Plan: Discharge home with preeclampsia precautions Labor precautions and fetal kick counts  Follow-up Information    [facility] Follow up.   Why:  Call to add an NST (nonstress test) to your visit scheduled on Friday. Return to MAU as needed for signs of labor or emergencies. Contact information: [address] [place] [address] [phone]          Allergies as of 11/01/2016   No Known Allergies     Medication List    TAKE these medications   calcium carbonate 500 MG chewable tablet Commonly known as:  TUMS - dosed in mg elemental calcium Chew 1 tablet by mouth as needed for indigestion or heartburn.       Sharen Counter Certified Nurse-Midwife 11/01/2016 1:48 PM

## 2016-11-01 NOTE — Discharge Instructions (Signed)
Hypertension During Pregnancy °Hypertension, commonly called high blood pressure, is when the force of blood pumping through your arteries is too strong. Arteries are blood vessels that carry blood from the heart throughout the body. Hypertension during pregnancy can cause problems for you and your baby. Your baby may be born early (prematurely) or may not weigh as much as he or she should at birth. Very bad cases of hypertension during pregnancy can be life-threatening. °Different types of hypertension can occur during pregnancy. These include: °· Chronic hypertension. This happens when: °¨ You have hypertension before pregnancy and it continues during pregnancy. °¨ You develop hypertension before you are [redacted] weeks pregnant, and it continues during pregnancy. °· Gestational hypertension. This is hypertension that develops after the 20th week of pregnancy. °· Preeclampsia, also called toxemia of pregnancy. This is a very serious type of hypertension that develops only during pregnancy. It affects the whole body, and it can be very dangerous for you and your baby. °Gestational hypertension and preeclampsia usually go away within 6 weeks after your baby is born. Women who have hypertension during pregnancy have a greater chance of developing hypertension later in life or during future pregnancies. °What are the causes? °The exact cause of hypertension is not known. °What increases the risk? °There are certain factors that make it more likely for you to develop hypertension during pregnancy. These include: °· Having hypertension during a previous pregnancy or prior to pregnancy. °· Being overweight. °· Being older than age 40. °· Being pregnant for the first time or being pregnant with more than one baby. °· Becoming pregnant using fertilization methods such as IVF (in vitro fertilization). °· Having diabetes, kidney problems, or systemic lupus erythematosus. °· Having a family history of hypertension. °What are the  signs or symptoms? °Chronic hypertension and gestational hypertension rarely cause symptoms. Preeclampsia causes symptoms, which may include: °· Increased protein in your urine. Your health care provider will check for this at every visit before you give birth (prenatal visit). °· Severe headaches. °· Sudden weight gain. °· Swelling of the hands, face, legs, and feet. °· Nausea and vomiting. °· Vision problems, such as blurred or double vision. °· Numbness in the face, arms, legs, and feet. °· Dizziness. °· Slurred speech. °· Sensitivity to bright lights. °· Abdominal pain. °· Convulsions. °How is this diagnosed? °You may be diagnosed with hypertension during a routine prenatal exam. At each prenatal visit, you may: °· Have a urine test to check for high amounts of protein in your urine. °· Have your blood pressure checked. A blood pressure reading is recorded as two numbers, such as "120 over 80" (or 120/80). The first ("top") number is called the systolic pressure. It is a measure of the pressure in your arteries when your heart beats. The second ("bottom") number is called the diastolic pressure. It is a measure of the pressure in your arteries as your heart relaxes between beats. Blood pressure is measured in a unit called mm Hg. A normal blood pressure reading is: °¨ Systolic: below 120. °¨ Diastolic: below 80. °The type of hypertension that you are diagnosed with depends on your test results and when your symptoms developed. °· Chronic hypertension is usually diagnosed before 20 weeks of pregnancy. °· Gestational hypertension is usually diagnosed after 20 weeks of pregnancy. °· Hypertension with high amounts of protein in the urine is diagnosed as preeclampsia. °· Blood pressure measurements that stay above 160 systolic, or above 110 diastolic, are signs of severe preeclampsia. °  How is this treated? °Treatment for hypertension during pregnancy varies depending on the type of hypertension you have and how  serious it is. °· If you take medicines called ACE inhibitors to treat chronic hypertension, you may need to switch medicines. ACE inhibitors should not be taken during pregnancy. °· If you have gestational hypertension, you may need to take blood pressure medicine. °· If you are at risk for preeclampsia, your health care provider may recommend that you take a low-dose aspirin every day to prevent high blood pressure during your pregnancy. °· If you have severe preeclampsia, you may need to be hospitalized so you and your baby can be monitored closely. You may also need to take medicine (magnesium sulfate) to prevent seizures and to lower blood pressure. This medicine may be given as an injection or through an IV tube. °· In some cases, if your condition gets worse, you may need to deliver your baby early. °Follow these instructions at home: °Eating and drinking °· Drink enough fluid to keep your urine clear or pale yellow. °· Eat a healthy diet that is low in salt (sodium). Do not add salt to your food. Check food labels to see how much sodium a food or beverage contains. °Lifestyle °· Do not use any products that contain nicotine or tobacco, such as cigarettes and e-cigarettes. If you need help quitting, ask your health care provider. °· Do not use alcohol. °· Avoid caffeine. °· Avoid stress as much as possible. Rest and get plenty of sleep. °General instructions °· Take over-the-counter and prescription medicines only as told by your health care provider. °· While lying down, lie on your left side. This keeps pressure off your baby. °· While sitting or lying down, raise (elevate) your feet. Try putting some pillows under your lower legs. °· Exercise regularly. Ask your health care provider what kinds of exercise are best for you. °· Keep all prenatal and follow-up visits as told by your health care provider. This is important. °Contact a health care provider if: °· You have symptoms that your health care provider  told you may require more treatment or monitoring, such as: °¨ Fever. °¨ Vomiting. °¨ Headache. °Get help right away if: °· You have severe abdominal pain or vomiting that does not get better with treatment. °· You suddenly develop swelling in your hands, ankles, or face. °· You gain 4 lbs (1.8 kg) or more in 1 week. °· You develop vaginal bleeding, or you have blood in your urine. °· You do not feel your baby moving as much as usual. °· You have blurred or double vision. °· You have muscle twitching or sudden tightening (spasms). °· You have shortness of breath. °· Your lips or fingernails turn blue. °This information is not intended to replace advice given to you by your health care provider. Make sure you discuss any questions you have with your health care provider. °Document Released: 05/03/2011 Document Revised: 03/04/2016 Document Reviewed: 01/29/2016 °Elsevier Interactive Patient Education © 2017 [employer] Inc. ° °

## 2016-11-01 NOTE — MAU Note (Signed)
Pt presents to MAU with complaints of SOB, headache since Saturday. Pt states she did take her blood pressure at [employer] Saturday and it was elevated, Called her physician today and was told to come in.

## 2016-11-04 ENCOUNTER — Other Ambulatory Visit: Payer: Self-pay | Admitting: Obstetrics & Gynecology

## 2016-11-19 ENCOUNTER — Inpatient Hospital Stay (HOSPITAL_COMMUNITY)
Admission: AD | Admit: 2016-11-19 | Discharge: 2016-11-22 | DRG: 775 | Disposition: A | Discharge status: Home / Self Care | Payer: 59 | Source: Ambulatory Visit | Attending: Obstetrics and Gynecology | Admitting: Obstetrics and Gynecology

## 2016-11-19 ENCOUNTER — Encounter (HOSPITAL_COMMUNITY): Payer: Self-pay

## 2016-11-19 DIAGNOSIS — O9962 Diseases of the digestive system complicating childbirth: Secondary | ICD-10-CM | POA: Diagnosis present

## 2016-11-19 DIAGNOSIS — O134 Gestational [pregnancy-induced] hypertension without significant proteinuria, complicating childbirth: Principal | ICD-10-CM | POA: Diagnosis present

## 2016-11-19 DIAGNOSIS — Z3A18 18 weeks gestation of pregnancy: Secondary | ICD-10-CM

## 2016-11-19 DIAGNOSIS — Z3A37 37 weeks gestation of pregnancy: Secondary | ICD-10-CM

## 2016-11-19 DIAGNOSIS — O139 Gestational [pregnancy-induced] hypertension without significant proteinuria, unspecified trimester: Secondary | ICD-10-CM | POA: Diagnosis present

## 2016-11-19 DIAGNOSIS — R109 Unspecified abdominal pain: Secondary | ICD-10-CM

## 2016-11-19 DIAGNOSIS — Z3689 Encounter for other specified antenatal screening: Secondary | ICD-10-CM

## 2016-11-19 DIAGNOSIS — Z87891 Personal history of nicotine dependence: Secondary | ICD-10-CM

## 2016-11-19 DIAGNOSIS — K219 Gastro-esophageal reflux disease without esophagitis: Secondary | ICD-10-CM | POA: Diagnosis present

## 2016-11-19 DIAGNOSIS — O26899 Other specified pregnancy related conditions, unspecified trimester: Secondary | ICD-10-CM

## 2016-11-19 HISTORY — DX: Gestational (pregnancy-induced) hypertension without significant proteinuria, unspecified trimester: O13.9

## 2016-11-19 LAB — CBC
HCT: 36.3 % (ref 36.0–46.0)
HEMOGLOBIN: 12.1 g/dL (ref 12.0–15.0)
MCH: 26.2 pg (ref 26.0–34.0)
MCHC: 33.3 g/dL (ref 30.0–36.0)
MCV: 78.7 fL (ref 78.0–100.0)
Platelets: 171 10*3/uL (ref 150–400)
RBC: 4.61 MIL/uL (ref 3.87–5.11)
RDW: 15.5 % (ref 11.5–15.5)
WBC: 10.2 10*3/uL (ref 4.0–10.5)

## 2016-11-19 LAB — COMPREHENSIVE METABOLIC PANEL
ALBUMIN: 3.2 g/dL — AB (ref 3.5–5.0)
ALK PHOS: 140 U/L — AB (ref 38–126)
ALT: 13 U/L — ABNORMAL LOW (ref 14–54)
ANION GAP: 10 (ref 5–15)
AST: 18 U/L (ref 15–41)
BUN: 6 mg/dL (ref 6–20)
CALCIUM: 8.9 mg/dL (ref 8.9–10.3)
CHLORIDE: 106 mmol/L (ref 101–111)
CO2: 20 mmol/L — AB (ref 22–32)
Creatinine, Ser: 0.42 mg/dL — ABNORMAL LOW (ref 0.44–1.00)
GFR calc Af Amer: >60 mL/min (ref >60)
GFR calc non Af Amer: >60 mL/min (ref >60)
GLUCOSE: 108 mg/dL — AB (ref 65–99)
POTASSIUM: 3.8 mmol/L (ref 3.5–5.1)
SODIUM: 136 mmol/L (ref 135–145)
Total Bilirubin: 0.4 mg/dL (ref 0.3–1.2)
Total Protein: 6.7 g/dL (ref 6.5–8.1)

## 2016-11-19 LAB — TYPE AND SCREEN
ABO/RH(D): O NEG
Antibody Screen: NEGATIVE

## 2016-11-19 LAB — PROTEIN / CREATININE RATIO, URINE
Creatinine, Urine: 68 mg/dL
Total Protein, Urine: <6 mg/dL

## 2016-11-19 LAB — LACTATE DEHYDROGENASE: LDH: 116 U/L (ref 98–192)

## 2016-11-19 LAB — URIC ACID: Uric Acid, Serum: 4.7 mg/dL (ref 2.3–6.6)

## 2016-11-19 MED ORDER — OXYTOCIN 40 UNITS IN LACTATED RINGERS INFUSION - SIMPLE MED: 2.5000 [IU]/h | INTRAVENOUS | Status: DC

## 2016-11-19 MED ORDER — TERBUTALINE SULFATE 1 MG/ML IJ SOLN
0.2500 mg | Freq: Once | INTRAMUSCULAR | Status: DC | PRN
  Filled 2016-11-19: qty 1

## 2016-11-19 MED ORDER — BUTORPHANOL TARTRATE 1 MG/ML IJ SOLN
1.0000 mg | INTRAMUSCULAR | Status: DC | PRN
  Administered 2016-11-20 (×2): 1 mg via INTRAVENOUS
  Filled 2016-11-19 (×2): qty 1

## 2016-11-19 MED ORDER — LACTATED RINGERS IV SOLN: 500.0000 mL | INTRAVENOUS | Status: DC | PRN

## 2016-11-19 MED ORDER — LACTATED RINGERS IV SOLN
INTRAVENOUS | Status: DC
  Administered 2016-11-19 (×2): via INTRAVENOUS

## 2016-11-19 MED ORDER — OXYCODONE-ACETAMINOPHEN 5-325 MG PO TABS: 2.0000 | ORAL_TABLET | ORAL | Status: DC | PRN

## 2016-11-19 MED ORDER — OXYTOCIN BOLUS FROM INFUSION: 500.0000 mL | Freq: Once | INTRAVENOUS | Status: DC

## 2016-11-19 MED ORDER — SOD CITRATE-CITRIC ACID 500-334 MG/5ML PO SOLN: 30.0000 mL | ORAL | Status: DC | PRN

## 2016-11-19 MED ORDER — ACETAMINOPHEN 325 MG PO TABS
650.0000 mg | ORAL_TABLET | ORAL | Status: DC | PRN
  Administered 2016-11-20: 650 mg via ORAL
  Filled 2016-11-19: qty 2

## 2016-11-19 MED ORDER — OXYCODONE-ACETAMINOPHEN 5-325 MG PO TABS: 1.0000 | ORAL_TABLET | ORAL | Status: DC | PRN

## 2016-11-19 MED ORDER — LIDOCAINE HCL (PF) 1 % IJ SOLN
30.0000 mL | INTRAMUSCULAR | Status: DC | PRN
  Filled 2016-11-19: qty 30

## 2016-11-19 MED ORDER — FLEET ENEMA 7-19 GM/118ML RE ENEM: 1.0000 | ENEMA | RECTAL | Status: DC | PRN

## 2016-11-19 MED ORDER — MISOPROSTOL 25 MCG QUARTER TABLET
25.0000 ug | ORAL_TABLET | ORAL | Status: DC | PRN
  Administered 2016-11-19: 25 ug via VAGINAL
  Filled 2016-11-19 (×2): qty 1

## 2016-11-19 MED ORDER — OXYTOCIN 40 UNITS IN LACTATED RINGERS INFUSION - SIMPLE MED
1.0000 m[IU]/min | INTRAVENOUS | Status: DC
  Administered 2016-11-19: 2 m[IU]/min via INTRAVENOUS
  Administered 2016-11-20: 10 m[IU]/min via INTRAVENOUS
  Filled 2016-11-19: qty 1000

## 2016-11-19 MED ORDER — ONDANSETRON HCL 4 MG/2ML IJ SOLN
4.0000 mg | Freq: Four times a day (QID) | INTRAMUSCULAR | Status: DC | PRN
  Administered 2016-11-20: 4 mg via INTRAVENOUS
  Filled 2016-11-19: qty 2

## 2016-11-19 NOTE — MAU Note (Signed)
Patient presents with decreased fetal movement all day, did kick counts and baby only moved 3 times in one hour, denies problems during the pregnancy

## 2016-11-19 NOTE — Anesthesia Pain Management Evaluation Note (Signed)
  CRNA Pain Management Visit Note  Patient: Carly PloverCorina Rush, 26 y.o., female  "Hello I am a member of the anesthesia team at [facility]. We have an anesthesia team available at all times to provide care throughout the hospital, including epidural management and anesthesia for C-section. I don't know your plan for the delivery whether it a natural birth, water birth, IV sedation, nitrous supplementation, doula or epidural, but we want to meet your pain goals."   1.Was your pain managed to your expectations on prior hospitalizations?   Yes   2.What is your expectation for pain management during this hospitalization?     IV pain meds  3.How can we help you reach that goal? Support prn  Record the patient's initial score and the patient's pain goal.   Pain: 0  Pain Goal: 5 [facility] wants you to be able to say your pain was always managed very well.  [facility],Magie Ciampa 11/19/2016

## 2016-11-19 NOTE — H&P (Signed)
Please refer to detailed history below.   Briefly, this is a 26 y.o. G3P1010 41w6dwho presented for decreased fm but was found to have several BPs over 140-150s/90s.  On 11-01-16, pt was in MAU and had BP of 151/85.  [facility]  She has had normal BPs in the office up until this point.   Pt was treated for gonorrhea at beginning of March. NST R. Admit for cytotec induction for PIH at term.  GBS neg.  HMurphyA   Chief Complaint  Patient presents with  . Decreased Fetal Movement   HPI   Ms.Carly Cifelliis a 26y.o. female [id number]@ [id number] in MAU with decreased fetal movement. She presents today stating that the baby is not moving as much. She ate some cereal around 0400 and then did  "kick counts" at 0900: she felt 3 movements from 9-10:00.          OB History    Gravida Para Term Preterm AB Living   _0 0 1 1   SAB TAB Ectopic Multiple Live Births   0 1 0 0 1          Past Medical History:  Diagnosis Date  . Asthma   . Chlamydia 2011  . Gastric reflux   . Gonorrhea 2011  . Headache(784.0)   . Hypertension   . Von Willebrand disease ([facility]         Past Surgical History:  Procedure Laterality Date  . INDUCED ABORTION    . TONSILLECTOMY            Family History  Problem Relation Age of Onset  . Cancer Mother     cervical  . Hypertension Father   . Diabetes Brother   . Anesthesia problems Neg Hx           Social History  Substance Use Topics  . Smoking status: Former Smoker    Years: 5.00    Types: Cigarettes    Quit date: 08/31/2014  . Smokeless tobacco: Never Used  . Alcohol use No     Comment: 3-4 drinks occasionally    Allergies: No Known Allergies         Prescriptions Prior to Admission  Medication Sig Dispense Refill Last Dose  . calcium carbonate (TUMS - DOSED IN MG ELEMENTAL CALCIUM) 500 MG chewable tablet Chew 1 tablet by mouth as  needed for indigestion or heartburn.   10/31/2016 at Unknown time   Lab Results Last 48 Hours        Results for orders placed or performed during the hospital encounter of 11/19/16 (from the past 48 hour(s))  Protein / creatinine ratio, urine     Status: None   Collection Time: 11/19/16  9:52 AM  Result Value Ref Range   Creatinine, Urine 68.00 mg/dL   Total Protein, Urine <6 mg/dL    Comment: NO NORMAL RANGE ESTABLISHED FOR THIS TEST REPEATED TO VERIFY    Protein Creatinine Ratio        0.00 - 0.15 mg/mg[Cre]    Comment: RESULT BELOW REPORTABLE RANGE, UNABLE TO CALCULATE.   CBC     Status: None   Collection Time: 11/19/16 10:53 AM  Result Value Ref Range   WBC 10.2 4.0 - 10.5 K/uL   RBC 4.61 3.87 - 5.11 MIL/uL   Hemoglobin 12.1 12.0 - 15.0 g/dL   HCT 36.3 36.0 - 46.0 %  MCV 78.7 78.0 - 100.0 fL   MCH 26.2 26.0 - 34.0 pg   MCHC 33.3 30.0 - 36.0 g/dL   RDW 15.5 11.5 - 15.5 %   Platelets 171 150 - 400 K/uL  Comprehensive metabolic panel     Status: Abnormal   Collection Time: 11/19/16 10:53 AM  Result Value Ref Range   Sodium 136 135 - 145 mmol/L   Potassium 3.8 3.5 - 5.1 mmol/L   Chloride 106 101 - 111 mmol/L   CO2 20 (L) 22 - 32 mmol/L   Glucose, Bld 108 (H) 65 - 99 mg/dL   BUN 6 6 - 20 mg/dL   Creatinine, Ser 0.42 (L) 0.44 - 1.00 mg/dL   Calcium 8.9 8.9 - 10.3 mg/dL   Total Protein 6.7 6.5 - 8.1 g/dL   Albumin 3.2 (L) 3.5 - 5.0 g/dL   AST 18 15 - 41 U/L   ALT 13 (L) 14 - 54 U/L   Alkaline Phosphatase 140 (H) 38 - 126 U/L   Total Bilirubin 0.4 0.3 - 1.2 mg/dL   GFR calc non Af Amer >60 >60 mL/min   GFR calc Af Amer >60 >60 mL/min    Comment: (NOTE) The eGFR has been calculated using the CKD EPI equation. This calculation has not been validated in all clinical situations. eGFR's persistently <60 mL/min signify possible Chronic Kidney Disease.    Anion gap 10 5 - 15  Uric acid     Status: None   Collection Time:  11/19/16 10:53 AM  Result Value Ref Range   Uric Acid, Serum 4.7 2.3 - 6.6 mg/dL  Lactate dehydrogenase     Status: None   Collection Time: 11/19/16 10:53 AM  Result Value Ref Range   LDH 116 98 - 192 U/L      Review of Systems  Eyes: Negative for photophobia and visual disturbance.  Gastrointestinal: Negative for abdominal pain.  Neurological: Negative for headaches.   Physical Exam   Blood pressure (!) 145/79, pulse (!) 106, temperature 98.4 F (36.9 C), resp. rate 18, height _0  (1.6 m), weight 279 lb 1.9 oz (126.6 kg), last menstrual period 02/28/2016.   Patient Vitals for the past 24 hrs:  BP Temp Pulse Resp Height Weight  11/19/16 1047 (!) 141/79 - 100 - - -  11/19/16 1032 (!) 150/82 - (!) 103 - - -  11/19/16 1030 (!) 143/80 - 94 - - -  11/19/16 1027 139/67 - 96 - - -  11/19/16 1017 139/67 - 96 - - -  11/19/16 1015 136/70 - 89 - - -  11/19/16 1002 (!) 145/79 98.4 F (36.9 C) (!) 106 18 - -  11/19/16 0959 (!) 145/79 - (!) 106 - - -  11/19/16 0952 - - - - _1  (1.6 m) 279 lb 1.9 oz (126.6 kg)    Physical Exam  Constitutional: She is oriented to person, place, and time. She appears well-developed and well-nourished. No distress.  HENT:  Head: Normocephalic.  Eyes: Pupils are equal, round, and reactive to light.  Respiratory: Effort normal.  GI: Soft.  Musculoskeletal: Normal range of motion.  Neurological: She is alert and oriented to person, place, and time.  Skin: Skin is warm. She is not diaphoretic.  Psychiatric: Her behavior is normal.   Fetal Tracing: Baseline: 135 bpm Variability: Moderate  Accelerations: 15x15 Decelerations: None Toco: none  MAU Course  Procedures  None  MDM  Discussed labs and BP readings with Dr. Philis Pique.  She had  a BP on 3/6 of 151/85 patient meets criteria for induction of labor for gestational HTN @ 37/6 weeks. RN to perform cervical exam  Assessment and Plan   A:  1. Pregnancy induced  hypertension, antepartum   2. NST (non-stress test) reactive     P:  Admit to labor and delivery Cytotec for cervical ripening.  Call Dr. Philis Pique for further orders  GBS unknown  Lezlie Lye, NP 11/19/2016 12:17 PM

## 2016-11-19 NOTE — Progress Notes (Signed)
Pt checked with first cytotec dose for lesions by SSE and external exam- negative.  Pt comfortable still  FHTS 140s gSTV, NSTR Toco q occ SVE 23-/50/-2, AROM clear, IUPC placed  Start pit.

## 2016-11-19 NOTE — MAU Provider Note (Signed)
History     CSN: [id number]  Arrival date and time: 11/19/16 6468   First Provider Initiated Contact with Patient 11/19/16 1011      Chief Complaint  Patient presents with  . Decreased Fetal Movement   HPI   [name] is a 26 y.o. female G65P1011 @ 62w6dhere in MAU with decreased fetal movement. She presents today stating that the baby is not moving as much. She ate some cereal around 0400 and then did  "kick counts" at 0900: she felt 3 movements from 9-10:00.  OB History    Gravida Para Term Preterm AB Living   '3 1 1 ' 0 1 1   SAB TAB Ectopic Multiple Live Births   0 1 0 0 1      Past Medical History:  Diagnosis Date  . Asthma   . Chlamydia 2011  . Gastric reflux   . Gonorrhea 2011  . Headache(784.0)   . Hypertension   . Von Willebrand disease ([place]     Past Surgical History:  Procedure Laterality Date  . INDUCED ABORTION    . TONSILLECTOMY      Family History  Problem Relation Age of Onset  . Cancer Mother     cervical  . Hypertension Father   . Diabetes Brother   . Anesthesia problems Neg Hx     Social History  Substance Use Topics  . Smoking status: Former Smoker    Years: 5.00    Types: Cigarettes    Quit date: 08/31/2014  . Smokeless tobacco: Never Used  . Alcohol use No     Comment: 3-4 drinks occasionally    Allergies: No Known Allergies  Prescriptions Prior to Admission  Medication Sig Dispense Refill Last Dose  . calcium carbonate (TUMS - DOSED IN MG ELEMENTAL CALCIUM) 500 MG chewable tablet Chew 1 tablet by mouth as needed for indigestion or heartburn.   10/31/2016 at Unknown time   Results for orders placed or performed during the hospital encounter of 11/19/16 (from the past 48 hour(s))  Protein / creatinine ratio, urine     Status: None   Collection Time: 11/19/16  9:52 AM  Result Value Ref Range   Creatinine, Urine 68.00 mg/dL   Total Protein, Urine <6 mg/dL    Comment: NO NORMAL RANGE ESTABLISHED FOR THIS TEST REPEATED TO  VERIFY    Protein Creatinine Ratio        0.00 - 0.15 mg/mg[Cre]    Comment: RESULT BELOW REPORTABLE RANGE, UNABLE TO CALCULATE.   CBC     Status: None   Collection Time: 11/19/16 10:53 AM  Result Value Ref Range   WBC 10.2 4.0 - 10.5 K/uL   RBC 4.61 3.87 - 5.11 MIL/uL   Hemoglobin 12.1 12.0 - 15.0 g/dL   HCT 36.3 36.0 - 46.0 %   MCV 78.7 78.0 - 100.0 fL   MCH 26.2 26.0 - 34.0 pg   MCHC 33.3 30.0 - 36.0 g/dL   RDW 15.5 11.5 - 15.5 %   Platelets 171 150 - 400 K/uL  Comprehensive metabolic panel     Status: Abnormal   Collection Time: 11/19/16 10:53 AM  Result Value Ref Range   Sodium 136 135 - 145 mmol/L   Potassium 3.8 3.5 - 5.1 mmol/L   Chloride 106 101 - 111 mmol/L   CO2 20 (L) 22 - 32 mmol/L   Glucose, Bld 108 (H) 65 - 99 mg/dL   BUN 6 6 - 20 mg/dL   Creatinine,  Ser 0.42 (L) 0.44 - 1.00 mg/dL   Calcium 8.9 8.9 - 10.3 mg/dL   Total Protein 6.7 6.5 - 8.1 g/dL   Albumin 3.2 (L) 3.5 - 5.0 g/dL   AST 18 15 - 41 U/L   ALT 13 (L) 14 - 54 U/L   Alkaline Phosphatase 140 (H) 38 - 126 U/L   Total Bilirubin 0.4 0.3 - 1.2 mg/dL   GFR calc non Af Amer >60 >60 mL/min   GFR calc Af Amer >60 >60 mL/min    Comment: (NOTE) The eGFR has been calculated using the CKD EPI equation. This calculation has not been validated in all clinical situations. eGFR's persistently <60 mL/min signify possible Chronic Kidney Disease.    Anion gap 10 5 - 15  Uric acid     Status: None   Collection Time: 11/19/16 10:53 AM  Result Value Ref Range   Uric Acid, Serum 4.7 2.3 - 6.6 mg/dL  Lactate dehydrogenase     Status: None   Collection Time: 11/19/16 10:53 AM  Result Value Ref Range   LDH 116 98 - 192 U/L    Review of Systems  Eyes: Negative for photophobia and visual disturbance.  Gastrointestinal: Negative for abdominal pain.  Neurological: Negative for headaches.   Physical Exam   Blood pressure (!) 145/79, pulse (!) 106, temperature 98.4 F (36.9 C), resp. rate 18, height '5\' 3"'  (1.6 m),  weight 279 lb 1.9 oz (126.6 kg), last menstrual period 02/28/2016.   Patient Vitals for the past 24 hrs:  BP Temp Pulse Resp Height Weight  11/19/16 1047 (!) 141/79 - 100 - - -  11/19/16 1032 (!) 150/82 - (!) 103 - - -  11/19/16 1030 (!) 143/80 - 94 - - -  11/19/16 1027 139/67 - 96 - - -  11/19/16 1017 139/67 - 96 - - -  11/19/16 1015 136/70 - 89 - - -  11/19/16 1002 (!) 145/79 98.4 F (36.9 C) (!) 106 18 - -  11/19/16 0959 (!) 145/79 - (!) 106 - - -  11/19/16 2409 - - - - '5\' 3"'  (1.6 m) 279 lb 1.9 oz (126.6 kg)    Physical Exam  Constitutional: She is oriented to person, place, and time. She appears well-developed and well-nourished. No distress.  HENT:  Head: Normocephalic.  Eyes: Pupils are equal, round, and reactive to light.  Respiratory: Effort normal.  GI: Soft.  Musculoskeletal: Normal range of motion.  Neurological: She is alert and oriented to person, place, and time.  Skin: Skin is warm. She is not diaphoretic.  Psychiatric: Her behavior is normal.   Fetal Tracing: Baseline: 135 bpm Variability: Moderate  Accelerations: 15x15 Decelerations: None Toco: none  MAU Course  Procedures  None  MDM  Discussed labs and BP readings with Dr. Philis Pique.  She had a BP on 3/6 of 151/85 patient meets criteria for induction of labor for gestational HTN @ 37/6 weeks. RN to perform cervical exam  Assessment and Plan   A:  1. Pregnancy induced hypertension, antepartum   2. NST (non-stress test) reactive     P:  Admit to labor and delivery Cytotec for cervical ripening.  Call Dr. Philis Pique for further orders  GBS unknown  Lezlie Lye, NP 11/19/2016 12:17 PM

## 2016-11-20 ENCOUNTER — Inpatient Hospital Stay (HOSPITAL_COMMUNITY): Payer: 59 | Admitting: Anesthesiology

## 2016-11-20 LAB — RPR: RPR: NONREACTIVE

## 2016-11-20 MED ORDER — FENTANYL 2.5 MCG/ML BUPIVACAINE 1/10 % EPIDURAL INFUSION (WH - ANES)
INTRAMUSCULAR | Status: AC
  Filled 2016-11-20: qty 100

## 2016-11-20 MED ORDER — SENNOSIDES-DOCUSATE SODIUM 8.6-50 MG PO TABS
2.0000 | ORAL_TABLET | ORAL | Status: DC
  Administered 2016-11-20 – 2016-11-21 (×2): 2 via ORAL
  Filled 2016-11-20 (×2): qty 2

## 2016-11-20 MED ORDER — COCONUT OIL OIL: 1.0000 "application " | TOPICAL_OIL | Status: DC | PRN

## 2016-11-20 MED ORDER — SIMETHICONE 80 MG PO CHEW
80.0000 mg | CHEWABLE_TABLET | ORAL | Status: DC | PRN
  Administered 2016-11-21: 80 mg via ORAL
  Filled 2016-11-20: qty 1

## 2016-11-20 MED ORDER — EPHEDRINE 5 MG/ML INJ
10.0000 mg | INTRAVENOUS | Status: DC | PRN
  Filled 2016-11-20: qty 2

## 2016-11-20 MED ORDER — DIBUCAINE 1 % RE OINT: 1.0000 "application " | TOPICAL_OINTMENT | RECTAL | Status: DC | PRN

## 2016-11-20 MED ORDER — SODIUM CHLORIDE 0.9 % IV SOLN: 250.0000 mL | INTRAVENOUS | Status: DC | PRN

## 2016-11-20 MED ORDER — FENTANYL 2.5 MCG/ML BUPIVACAINE 1/10 % EPIDURAL INFUSION (WH - ANES)
14.0000 mL/h | INTRAMUSCULAR | Status: DC | PRN
  Administered 2016-11-20: 14 mL/h via EPIDURAL

## 2016-11-20 MED ORDER — MEASLES, MUMPS & RUBELLA VAC ~~LOC~~ INJ
0.5000 mL | INJECTION | Freq: Once | SUBCUTANEOUS | Status: DC
  Filled 2016-11-20: qty 0.5

## 2016-11-20 MED ORDER — DIPHENHYDRAMINE HCL 50 MG/ML IJ SOLN: 12.5000 mg | INTRAMUSCULAR | Status: DC | PRN

## 2016-11-20 MED ORDER — PHENYLEPHRINE 40 MCG/ML (10ML) SYRINGE FOR IV PUSH (FOR BLOOD PRESSURE SUPPORT)
80.0000 ug | PREFILLED_SYRINGE | INTRAVENOUS | Status: DC | PRN
Start: 2016-11-20 — End: 2016-11-20
  Filled 2016-11-20: qty 5

## 2016-11-20 MED ORDER — EPHEDRINE 5 MG/ML INJ: 10.0000 mg | INTRAVENOUS | Status: DC | PRN

## 2016-11-20 MED ORDER — SODIUM CHLORIDE 0.9% FLUSH: 3.0000 mL | INTRAVENOUS | Status: DC | PRN

## 2016-11-20 MED ORDER — OXYCODONE-ACETAMINOPHEN 5-325 MG PO TABS: 2.0000 | ORAL_TABLET | ORAL | Status: DC | PRN

## 2016-11-20 MED ORDER — LACTATED RINGERS IV SOLN: 500.0000 mL | Freq: Once | INTRAVENOUS | Status: DC

## 2016-11-20 MED ORDER — TETANUS-DIPHTH-ACELL PERTUSSIS 5-2.5-18.5 LF-MCG/0.5 IM SUSP: 0.5000 mL | Freq: Once | INTRAMUSCULAR | Status: DC

## 2016-11-20 MED ORDER — IBUPROFEN 800 MG PO TABS
800.0000 mg | ORAL_TABLET | Freq: Three times a day (TID) | ORAL | Status: DC
  Administered 2016-11-20 – 2016-11-22 (×6): 800 mg via ORAL
  Filled 2016-11-20 (×6): qty 1

## 2016-11-20 MED ORDER — FENTANYL 2.5 MCG/ML BUPIVACAINE 1/10 % EPIDURAL INFUSION (WH - ANES): 14.0000 mL/h | INTRAMUSCULAR | Status: DC | PRN

## 2016-11-20 MED ORDER — FERROUS SULFATE 325 (65 FE) MG PO TABS
325.0000 mg | ORAL_TABLET | Freq: Two times a day (BID) | ORAL | Status: DC
  Administered 2016-11-20 – 2016-11-22 (×4): 325 mg via ORAL
  Filled 2016-11-20 (×4): qty 1

## 2016-11-20 MED ORDER — LIDOCAINE HCL (PF) 1 % IJ SOLN
INTRAMUSCULAR | Status: DC | PRN
  Administered 2016-11-20: 10 mL

## 2016-11-20 MED ORDER — BENZOCAINE-MENTHOL 20-0.5 % EX AERO
1.0000 "application " | INHALATION_SPRAY | CUTANEOUS | Status: DC | PRN
  Administered 2016-11-22: 1 via TOPICAL
  Filled 2016-11-20 (×2): qty 56

## 2016-11-20 MED ORDER — ONDANSETRON HCL 4 MG PO TABS: 4.0000 mg | ORAL_TABLET | ORAL | Status: DC | PRN

## 2016-11-20 MED ORDER — METHYLERGONOVINE MALEATE 0.2 MG/ML IJ SOLN: 0.2000 mg | INTRAMUSCULAR | Status: DC | PRN

## 2016-11-20 MED ORDER — PRENATAL MULTIVITAMIN CH
1.0000 | ORAL_TABLET | Freq: Every day | ORAL | Status: DC
  Filled 2016-11-20: qty 1

## 2016-11-20 MED ORDER — ACETAMINOPHEN 325 MG PO TABS
650.0000 mg | ORAL_TABLET | ORAL | Status: DC | PRN
Start: 2016-11-20 — End: 2016-11-22

## 2016-11-20 MED ORDER — ZOLPIDEM TARTRATE 5 MG PO TABS: 5.0000 mg | ORAL_TABLET | Freq: Every evening | ORAL | Status: DC | PRN

## 2016-11-20 MED ORDER — WITCH HAZEL-GLYCERIN EX PADS: 1.0000 "application " | MEDICATED_PAD | CUTANEOUS | Status: DC | PRN

## 2016-11-20 MED ORDER — PHENYLEPHRINE 40 MCG/ML (10ML) SYRINGE FOR IV PUSH (FOR BLOOD PRESSURE SUPPORT)
80.0000 ug | PREFILLED_SYRINGE | INTRAVENOUS | Status: DC | PRN
  Filled 2016-11-20: qty 5

## 2016-11-20 MED ORDER — PHENYLEPHRINE 40 MCG/ML (10ML) SYRINGE FOR IV PUSH (FOR BLOOD PRESSURE SUPPORT)
80.0000 ug | PREFILLED_SYRINGE | INTRAVENOUS | Status: DC | PRN
Start: 2016-11-20 — End: 2016-11-20

## 2016-11-20 MED ORDER — METHYLERGONOVINE MALEATE 0.2 MG PO TABS: 0.2000 mg | ORAL_TABLET | ORAL | Status: DC | PRN

## 2016-11-20 MED ORDER — OXYCODONE-ACETAMINOPHEN 5-325 MG PO TABS: 1.0000 | ORAL_TABLET | ORAL | Status: DC | PRN

## 2016-11-20 MED ORDER — PHENYLEPHRINE 40 MCG/ML (10ML) SYRINGE FOR IV PUSH (FOR BLOOD PRESSURE SUPPORT)
PREFILLED_SYRINGE | INTRAVENOUS | Status: AC
  Filled 2016-11-20: qty 10

## 2016-11-20 MED ORDER — MAGNESIUM HYDROXIDE 400 MG/5ML PO SUSP: 30.0000 mL | ORAL | Status: DC | PRN

## 2016-11-20 MED ORDER — SODIUM CHLORIDE 0.9% FLUSH: 3.0000 mL | Freq: Two times a day (BID) | INTRAVENOUS | Status: DC

## 2016-11-20 MED ORDER — PHENYLEPHRINE 40 MCG/ML (10ML) SYRINGE FOR IV PUSH (FOR BLOOD PRESSURE SUPPORT): 80.0000 ug | PREFILLED_SYRINGE | INTRAVENOUS | Status: DC | PRN

## 2016-11-20 MED ORDER — DIPHENHYDRAMINE HCL 25 MG PO CAPS: 25.0000 mg | ORAL_CAPSULE | Freq: Four times a day (QID) | ORAL | Status: DC | PRN

## 2016-11-20 MED ORDER — ONDANSETRON HCL 4 MG/2ML IJ SOLN: 4.0000 mg | INTRAMUSCULAR | Status: DC | PRN

## 2016-11-20 NOTE — Anesthesia Procedure Notes (Signed)
Epidural Patient location during procedure: OB  Staffing Anesthesiologist: Akaash Vandewater  Preanesthetic Checklist Completed: patient identified, site marked, surgical consent, pre-op evaluation, timeout performed, IV checked, risks and benefits discussed and monitors and equipment checked  Epidural Patient position: sitting Prep: site prepped and draped and DuraPrep Patient monitoring: continuous pulse ox and blood pressure Approach: midline Location: L4-L5 Injection technique: LOR air  Needle:  Needle type: Tuohy  Needle gauge: 17 G Needle length: 9 cm and 9 Needle insertion depth: 9 cm Catheter type: closed end flexible Catheter size: 19 Gauge Catheter at skin depth: 14 cm Test dose: negative  Assessment Events: blood not aspirated, injection not painful, no injection resistance, negative IV test and no paresthesia

## 2016-11-20 NOTE — Progress Notes (Signed)
Patient moved to room 105 from 110. Patient receiving threatening phone calls stating that "I hope your baby dies like the last one." Patient made XXX and changed rooms. Carly Rush, Carly Rush

## 2016-11-20 NOTE — Anesthesia Preprocedure Evaluation (Signed)
Anesthesia Evaluation  Patient identified by MRN, date of birth, ID band Patient awake    Reviewed: Allergy & Precautions, H&P , NPO status , Patient's Chart, lab work & pertinent test results, reviewed documented beta blocker date and time   Airway Mallampati: III  TM Distance: >3 FB Neck ROM: full    Dental no notable dental hx.    Pulmonary neg pulmonary ROS, asthma , former smoker,    Pulmonary exam normal breath sounds clear to auscultation       Cardiovascular hypertension, negative cardio ROS Normal cardiovascular exam Rhythm:regular Rate:Normal     Neuro/Psych negative neurological ROS  negative psych ROS   GI/Hepatic negative GI ROS, Neg liver ROS, GERD  ,  Endo/Other  negative endocrine ROS  Renal/GU negative Renal ROS  negative genitourinary   Musculoskeletal   Abdominal   Peds  Hematology negative hematology ROS (+)   Anesthesia Other Findings Von willibrand  Reproductive/Obstetrics (+) Pregnancy                             Anesthesia Physical Anesthesia Plan  ASA: II  Anesthesia Plan: Epidural   Post-op Pain Management:    Induction:   Airway Management Planned:   Additional Equipment:   Intra-op Plan:   Post-operative Plan:   Informed Consent: I have reviewed the patients History and Physical, chart, labs and discussed the procedure including the risks, benefits and alternatives for the proposed anesthesia with the patient or authorized representative who has indicated his/her understanding and acceptance.     Plan Discussed with:   Anesthesia Plan Comments:         Anesthesia Quick Evaluation

## 2016-11-20 NOTE — Anesthesia Postprocedure Evaluation (Signed)
Anesthesia Post Note  Patient: Psychologist, sport and exerciseCorina Rush  Procedure(s) Performed: * No procedures listed *  Patient location during evaluation: Mother Baby Anesthesia Type: Epidural Level of consciousness: awake Pain management: pain level controlled Vital Signs Assessment: post-procedure vital signs reviewed and stable Respiratory status: spontaneous breathing Cardiovascular status: stable Postop Assessment: no headache, no backache, epidural receding, patient able to bend at knees, no signs of nausea or vomiting and adequate PO intake Anesthetic complications: no        Last Vitals:  Vitals:   11/20/16 1200 11/20/16 1300  BP: 112/67 122/66  Pulse: 87 84  Resp: 18 18  Temp: 36.9 C 36.8 C    Last Pain:  Vitals:   11/20/16 1300  TempSrc: Oral  PainSc: 0-No pain   Pain Goal:                 Carly Rush

## 2016-11-20 NOTE — Lactation Note (Signed)
This note was copied from a baby's chart. Lactation Consultation Note  Patient Name: Boy Dione PloverCorina Monnig AVWUJ'WToday's Date: 11/20/2016 Reason for consult: Initial assessment  Baby 6 hours old. Mom reports that baby latched well earlier and she heard swallows while baby nursing. Mom reports that the baby pulled off the breast and seemed satisfied. Mom has room full of visitors. Enc mom to offer lots of STS and nurse with cues. Mom states that she has been able to hand express and see colostrum and this is how she was able to get the baby to latch so well. Mom aware of OP/BFSG and LC phone line assistance after D/C.    Maternal Data Has patient been taught Hand Expression?: Yes Does the patient have breastfeeding experience prior to this delivery?:  (Demise at 8 months--room full of visitors, did not ask.)  Feeding Feeding Type: Breast Fed Length of feed: 10 min  LATCH Score/Interventions                      Lactation Tools Discussed/Used     Consult Status Consult Status: Follow-up Date: 11/21/16 Follow-up type: In-patient    Sherlyn HayJennifer D Hartlee Amedee 11/20/2016, 4:14 PM

## 2016-11-21 LAB — CBC
HEMATOCRIT: 32.2 % — AB (ref 36.0–46.0)
Hemoglobin: 10.8 g/dL — ABNORMAL LOW (ref 12.0–15.0)
MCH: 26.7 pg (ref 26.0–34.0)
MCHC: 33.5 g/dL (ref 30.0–36.0)
MCV: 79.5 fL (ref 78.0–100.0)
Platelets: 165 10*3/uL (ref 150–400)
RBC: 4.05 MIL/uL (ref 3.87–5.11)
RDW: 15.9 % — AB (ref 11.5–15.5)
WBC: 13.6 10*3/uL — ABNORMAL HIGH (ref 4.0–10.5)

## 2016-11-21 MED ORDER — FAMOTIDINE 20 MG PO TABS
20.0000 mg | ORAL_TABLET | Freq: Two times a day (BID) | ORAL | Status: DC
  Administered 2016-11-21: 20 mg via ORAL
  Filled 2016-11-21: qty 1

## 2016-11-21 MED ORDER — RHO D IMMUNE GLOBULIN 1500 UNIT/2ML IJ SOSY
300.0000 ug | PREFILLED_SYRINGE | Freq: Once | INTRAMUSCULAR | Status: AC
  Administered 2016-11-21: 300 ug via INTRAMUSCULAR
  Filled 2016-11-21: qty 2

## 2016-11-21 NOTE — Progress Notes (Signed)
Patient is eating, ambulating, voiding.  Pain control is good.  Appropriate lochia, no complaints.  No HA/vision change or RUQ pain.  Vitals:   11/20/16 1102 11/20/16 1200 11/20/16 1300 11/21/16 0609  BP: 105/60 112/67 122/66 (!) 104/52  Pulse: (!) 130 87 84 71  Resp: 20 18 18 18   Temp:  98.5 F (36.9 C) 98.3 F (36.8 C) 97.6 F (36.4 C)  TempSrc:  Oral Oral   SpO2:  98% 99%   Weight:      Height:        Fundus firm Perineum without swelling. No CT  Lab Results  Component Value Date   WBC 13.6 (H) 11/21/2016   HGB 10.8 (L) 11/21/2016   HCT 32.2 (L) 11/21/2016   MCV 79.5 11/21/2016   PLT 165 11/21/2016    --/--/O NEG (03/26 0530)  A/P Post partum day 1 s/p IOL for GHTN BP normal and mild, pt asymptomatic Circ desired, consent obtained.  Routine care.  Expect d/c 3/27.    Philip AspenALLAHAN, Sugar Vanzandt

## 2016-11-22 ENCOUNTER — Encounter (HOSPITAL_COMMUNITY): Payer: Self-pay | Admitting: *Deleted

## 2016-11-22 LAB — RH IG WORKUP (INCLUDES ABO/RH)
ABO/RH(D): O NEG
FETAL SCREEN: NEGATIVE
Gestational Age(Wks): 38
UNIT DIVISION: 0

## 2016-11-22 NOTE — Lactation Note (Signed)
This note was copied from a baby's chart. Lactation Consultation Note' Mom reports baby was up and feeding a lot through the night but has been sleepy this morning. Has not fed since 4 am. Unwrapped and undressed baby. Mom latched baby with minimal assistance from me. Reviewed basics with mom. Reviewed engorgement prevention and treatment. Has Spectra pump for home. No questions at present. Reviewed our phone number. OP appointments and BFSG as resources for support after DC. To call prn nursing on the second breast as I left room  Patient Name: Carly Rush'BToday's Date: 11/22/2016 Reason for consult: Follow-up assessment   Maternal Data Formula Feeding for Exclusion: No Has patient been taught Hand Expression?: Yes Does the patient have breastfeeding experience prior to this delivery?: Yes  Feeding Feeding Type: Breast Fed  LATCH Score/Interventions Latch: Grasps breast easily, tongue down, lips flanged, rhythmical sucking.  Audible Swallowing: A few with stimulation  Type of Nipple: Everted at rest and after stimulation  Comfort (Breast/Nipple): Soft / non-tender     Hold (Positioning): Assistance needed to correctly position infant at breast and maintain latch. Intervention(s): Breastfeeding basics reviewed;Position options;Support Pillows  LATCH Score: 8  Lactation Tools Discussed/Used     Consult Status Consult Status: Complete    Pamelia HoitWeeks, Sly Parlee D 11/22/2016, 10:45 AM

## 2016-11-22 NOTE — Clinical Social Work Maternal (Signed)
CLINICAL SOCIAL WORK MATERNAL/CHILD NOTE  Patient Details  Name: Carly Rush MRN: [id number] Date of Birth: [birth year]  Date:  11/22/2016  Clinical Social Worker Initiating Note:  Laurey Arrow Date/ Time Initiated:  11/21/16/1500     Child's Name:  Carly Rush   Legal Guardian:  Mother (FOb is Paulette Blanch 12/07/1994)   Need for Interpreter:  None   Date of Referral:  11/20/16     Reason for Referral:  Grief and Loss , Other (Comment) (harrassing phone calls to the hospital while inpatient. )   Referral Source:  [employer]   Address:  [phone] [place] [address]  Phone number:  [phone]   Household Members:  Self, Significant Other   Natural Supports (not living in the home):  Parent, Immediate Family, Extended Family, Friends (FOB's family will also be source of support. 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   Strengths:  Ability to meet basic needs , Home prepared for child    Risk Factors/Current Problems:  Mental Health Concerns  (Grief and Loss)   Cognitive State:  Alert , Able to Concentrate , Goal Oriented , Insightful , Linear Thinking    Mood/Affect:  Bright , Calm , Happy , Interested , Comfortable    CSW Assessment: CSW met with MOB to complete an assessment for infant loss (02/12/2014) and harassing phone calls while MOB has been in the hospital. When CSW arrived MOB was sitting in bed bonding with infant as evidence by engaging in skin to skin.  MOB appeared comfortable and was appropriate with responding to infant's cues. MOB was polite,  inviting, and was interested in meeting with CSW.  CSW inquired about the harassing phone calls that MOB had received previously while inpatient.  MOB acknowledged the calls and appeared unbothered. MOB reported not knowing who would want to call and harass MOB and "wish harm to MOB's family". CSW assisted MOB with processing MOB's thoughts and feelings about the harassing phone calls and MOB stated, "I don't know who it is, and I don't care; I just want this time to attach and bond with my baby".  CSW validated MOB's feeling and encouraged continued bonding interventions.  CSW also provided MOB with resource information to the [facility] in the event that MOB is harassed after d/c; MOB was appreciative of the information.    CSW inquired about MOB's loss in 21-Oct-2013.  MOB was open to communicating her loss and was appropriate with processing MOB's thoughts and feelings. MOB reported that the infant's death in 2013-10-21, was undetermined. MOB denied seeking counseling and reported that MOB received a wealth of support from MOB's family and the infant's father family.  MOB reported that she meets annually with [facility] Staff to attend their annual "Walk of Remembrance". MOB reported that MOB attending the walk has assisted MOB with MOB's grief and loss process.  CSW offered MOB resources for outpatient counseling and MOB declined.  MOB reports MOB has been doing well and has accepted the loss of MOB's infant. CSW educated MOB about PPD. CSW informed MOB of possible supports and interventions  to decrease PPD.  CSW also encouraged MOB to seek medical attention if needed for increased signs and symptoms for PPD.  MOB acknowledged that MOB experienced some PPD after the birth of MOB's oldest child.  MOB reported feelings of sadness and uncontrollable crying for a few days.  MOB reported that MOB's signs and symptoms subsided with not interventions after a few days.  CSW provided MOB with a PPD checklist and  encouraged MOB to complete it weekly and to share any concerns with MOB's OBGYN; MOB agreed.  CSW reviewed safe sleep and SIDS. MOB reported that MOB has a pack n play for infant, but is nervous about SIDS.  CSW validated and normalized MOB's feelings due to MOB's loss in 2015.  MOB was knowledgeable and asked appropriate questions; MOB also responded appropriately to CSW's questions. MOB disclosed that MOB's mother purchased an Data processing manager for infant and MOB plans to do anything to decrease the chances of SIDS. MOB did not have any further questions, concerns, or needs at this time.  CSW thanked MOB for meeting with CSW and encouraged MOB to contact CSW if a need arise.   CSW Plan/Description:  Information/Referral to [employer] , [employer] , No Further Intervention Required/No Barriers to Discharge   Laurey Arrow, MSW, LCSW Clinical Social Work [phone]  Dimple Nanas, LCSW 11/22/2016, 10:01 AM

## 2016-11-22 NOTE — Discharge Summary (Addendum)
Obstetric Discharge Summary Reason for Admission: induction of labor Prenatal Procedures: PIH, NST  Intrapartum Procedures: spontaneous vaginal delivery Postpartum Procedures: Rho(D) Ig Complications-Operative and Postpartum: first degree subclitoral laceration Hemoglobin  Date Value Ref Range Status  11/21/2016 10.8 (L) 12.0 - 15.0 g/dL Final   HCT  Date Value Ref Range Status  11/21/2016 32.2 (L) 36.0 - 46.0 % Final    Discharge Diagnoses: Term pregnancy with PIH  Discharge Information: Date: 11/22/2016 Activity: pelvic rest Diet: routine Medications: Ibuprofen and Iron Condition: stable Instructions: refer to practice specific booklet Discharge to: home Follow-up Information    March Joos A, MD Follow up in 4 week(s).   Specialty:  Obstetrics and Gynecology Contact information: [address] [place] [address] [phone]           Newborn Data: Live born female  Birth Weight: 7 lb 7.6 oz (3390 g) APGAR: 8, 9  Home with mother.  Thurma Priego A 11/22/2016, 7:44 AM

## 2016-11-22 NOTE — Progress Notes (Addendum)
Patient is eating, ambulating, voiding.  Pain control is good.  Vitals:   11/20/16 1300 11/21/16 0609 11/21/16 1756 11/22/16 0613  BP: 122/66 (!) 104/52 (!) 116/54 (!) 111/50  Pulse: 84 71 87   Resp: 18 18 18 18   Temp: 98.3 F (36.8 C) 97.6 F (36.4 C) 98.7 F (37.1 C) 98.5 F (36.9 C)  TempSrc: Oral  Oral Oral  SpO2: 99%  100%   Weight:      Height:        Fundus firm Perineum without swelling.  Results for orders placed or performed during the hospital encounter of 11/19/16 (from the past 48 hour(s))  CBC     Status: Abnormal   Collection Time: 11/21/16  5:30 AM  Result Value Ref Range   WBC 13.6 (H) 4.0 - 10.5 K/uL   RBC 4.05 3.87 - 5.11 MIL/uL   Hemoglobin 10.8 (L) 12.0 - 15.0 g/dL   HCT 40.932.2 (L) 81.136.0 - 91.446.0 %   MCV 79.5 78.0 - 100.0 fL   MCH 26.7 26.0 - 34.0 pg   MCHC 33.5 30.0 - 36.0 g/dL   RDW 78.215.9 (H) 95.611.5 - 21.315.5 %   Platelets 165 150 - 400 K/uL  Rh IG workup (includes ABO/Rh)     Status: None (Preliminary result)   Collection Time: 11/21/16  5:30 AM  Result Value Ref Range   Gestational Age(Wks) 38    ABO/RH(D) O NEG    Fetal Screen NEG    Unit Number [id number]    Blood Component Type RHIG    Unit division 00    Status of Unit ISSUED    Transfusion Status OK TO TRANSFUSE     --/--/O NEG (03/26 0530)/RI  Rush/P Post partum day 2 with PIH.  Rhogam given- Baby O pos .  Routine care.  Expect d/c today.    Carly Rush

## 2016-12-28 ENCOUNTER — Other Ambulatory Visit: Payer: Self-pay | Admitting: Obstetrics and Gynecology

## 2016-12-29 LAB — CYTOLOGY - PAP

## 2017-08-02 ENCOUNTER — Other Ambulatory Visit: Payer: Self-pay

## 2017-08-02 ENCOUNTER — Encounter (HOSPITAL_COMMUNITY): Payer: Self-pay | Admitting: Emergency Medicine

## 2017-08-02 ENCOUNTER — Emergency Department (HOSPITAL_COMMUNITY)
Admission: EM | Admit: 2017-08-02 | Discharge: 2017-08-02 | Disposition: A | Discharge status: Home / Self Care | Payer: 59 | Attending: Emergency Medicine | Admitting: Emergency Medicine

## 2017-08-02 DIAGNOSIS — R51 Headache: Secondary | ICD-10-CM | POA: Insufficient documentation

## 2017-08-02 DIAGNOSIS — Z79899 Other long term (current) drug therapy: Secondary | ICD-10-CM | POA: Diagnosis not present

## 2017-08-02 DIAGNOSIS — I10 Essential (primary) hypertension: Secondary | ICD-10-CM | POA: Diagnosis not present

## 2017-08-02 DIAGNOSIS — R509 Fever, unspecified: Secondary | ICD-10-CM | POA: Diagnosis not present

## 2017-08-02 DIAGNOSIS — J45909 Unspecified asthma, uncomplicated: Secondary | ICD-10-CM | POA: Insufficient documentation

## 2017-08-02 DIAGNOSIS — B349 Viral infection, unspecified: Secondary | ICD-10-CM | POA: Insufficient documentation

## 2017-08-02 DIAGNOSIS — R112 Nausea with vomiting, unspecified: Secondary | ICD-10-CM

## 2017-08-02 DIAGNOSIS — Z87891 Personal history of nicotine dependence: Secondary | ICD-10-CM | POA: Diagnosis not present

## 2017-08-02 DIAGNOSIS — R000 Tachycardia, unspecified: Secondary | ICD-10-CM | POA: Insufficient documentation

## 2017-08-02 LAB — CBC
HCT: 43.2 % (ref 36.0–46.0)
Hemoglobin: 14.8 g/dL (ref 12.0–15.0)
MCH: 27.4 pg (ref 26.0–34.0)
MCHC: 34.3 g/dL (ref 30.0–36.0)
MCV: 80 fL (ref 78.0–100.0)
PLATELETS: 227 10*3/uL (ref 150–400)
RBC: 5.4 MIL/uL — AB (ref 3.87–5.11)
RDW: 13.2 % (ref 11.5–15.5)
WBC: 12.2 10*3/uL — ABNORMAL HIGH (ref 4.0–10.5)

## 2017-08-02 LAB — COMPREHENSIVE METABOLIC PANEL
ALK PHOS: 52 U/L (ref 38–126)
ALT: 57 U/L — AB (ref 14–54)
ANION GAP: 9 (ref 5–15)
AST: 42 U/L — ABNORMAL HIGH (ref 15–41)
Albumin: 4.5 g/dL (ref 3.5–5.0)
BILIRUBIN TOTAL: 1.3 mg/dL — AB (ref 0.3–1.2)
BUN: 11 mg/dL (ref 6–20)
CALCIUM: 9 mg/dL (ref 8.9–10.3)
CO2: 22 mmol/L (ref 22–32)
CREATININE: 0.57 mg/dL (ref 0.44–1.00)
Chloride: 105 mmol/L (ref 101–111)
Glucose, Bld: 114 mg/dL — ABNORMAL HIGH (ref 65–99)
Potassium: 3.8 mmol/L (ref 3.5–5.1)
Sodium: 136 mmol/L (ref 135–145)
Total Protein: 8.1 g/dL (ref 6.5–8.1)

## 2017-08-02 LAB — LIPASE, BLOOD: Lipase: 22 U/L (ref 11–51)

## 2017-08-02 LAB — URINALYSIS, ROUTINE W REFLEX MICROSCOPIC
Bacteria, UA: NONE SEEN
Bilirubin Urine: NEGATIVE
GLUCOSE, UA: NEGATIVE mg/dL
Hgb urine dipstick: NEGATIVE
KETONES UR: NEGATIVE mg/dL
Nitrite: NEGATIVE
PH: 5 (ref 5.0–8.0)
Protein, ur: 30 mg/dL — AB
SPECIFIC GRAVITY, URINE: 1.03 (ref 1.005–1.030)

## 2017-08-02 LAB — I-STAT BETA HCG BLOOD, ED (MC, WL, AP ONLY): I-stat hCG, quantitative: <5 m[IU]/mL (ref <5)

## 2017-08-02 MED ORDER — KETOROLAC TROMETHAMINE 30 MG/ML IJ SOLN
30.0000 mg | Freq: Once | INTRAMUSCULAR | Status: AC
  Administered 2017-08-02: 30 mg via INTRAVENOUS
  Filled 2017-08-02: qty 1

## 2017-08-02 MED ORDER — ONDANSETRON HCL 4 MG PO TABS: 4.0000 mg | ORAL_TABLET | Freq: Four times a day (QID) | ORAL | 0 refills | Status: DC

## 2017-08-02 MED ORDER — ONDANSETRON HCL 4 MG/2ML IJ SOLN
4.0000 mg | Freq: Once | INTRAMUSCULAR | Status: AC
  Administered 2017-08-02: 4 mg via INTRAVENOUS
  Filled 2017-08-02: qty 2

## 2017-08-02 MED ORDER — SODIUM CHLORIDE 0.9 % IV BOLUS (SEPSIS)
1000.0000 mL | Freq: Once | INTRAVENOUS | Status: AC
  Administered 2017-08-02: 1000 mL via INTRAVENOUS

## 2017-08-02 NOTE — ED Triage Notes (Signed)
Pt states she has been vomiting since 530 this morning  Pt states she has been running a fever at home highest today was 103  Pt states she has had chills and also diarrhea and headache  Pt states she tried to take some ibuprofen but threw it up and she took some pepto bismol without relief

## 2017-08-02 NOTE — Discharge Instructions (Signed)
Take one tablet of Zofran once every 6 hours as needed for nausea and vomiting. Take 650 mg of Tylenol or 600 mg of ibuprofen with food every 6 hours as needed for headache. Following a bland diet for the next few days may improve your symptoms.  Make sure to drink plenty of fluids. Wash your hands frequently to avoid spread of infection.  If you develop new or worsening symptoms including, abdominal pain, vomiting despite taking Zofran, a worsening fever, or other concerning symptoms, please return to the Emergency Department for re-evaluation.

## 2017-08-02 NOTE — ED Provider Notes (Signed)
[facility]-EMERGENCY DEPT Provider Note   CSN: [id number] Arrival date & time: 08/02/17  1928     History   Chief Complaint Chief Complaint  Patient presents with  . Fever  . Emesis  . Generalized Body Aches    HPI Carly Rush is a 26 y.o. female who presents to the emergency department with a chief complaint of NBNB emesis x7 that began when she awoke at 5:30 this morning. Last episode at 5 PM this evening.  She also complains of a fever that began tonight around 5 PM, Tmax 103 using a pediatric thermometer placed on the forehead.  She also complains of 2-3 episodes of loose stools, HA, and chills.  She denies myalgias, diarrhea, chest pain, abdominal pain, shortness of breath, back pain, dysuria, vaginal discharge, itching, or pain.  She treated her symptoms with 1 dose of Pepto-Bismol this morning around 530.  She attempted to take 1 dose of ibuprofen, but could not keep it down due to the vomiting. She ate pizza and [employer] last night for dinner.   No sick contacts.  She is not up-to-date on her flu shot.   The history is provided by the patient. No language interpreter was used.    Past Medical History:  Diagnosis Date  . Asthma   . Chlamydia 2011  . Gastric reflux   . Gonorrhea 2011  . Headache(784.0)   . Hypertension   . Pregnancy induced hypertension   . Von Willebrand disease [facility](HCC)     Patient Active Problem List   Diagnosis Date Noted  . Postpartum state 11/20/2016  . Gestational hypertension 11/19/2016  . Von Willebrand disease (HCC) 11/02/2011  . Menometrorrhagia 07/26/2011    Past Surgical History:  Procedure Laterality Date  . INDUCED ABORTION    . TONSILLECTOMY      OB History    Gravida Para Term Preterm AB Living   3 1 1  0 1 0   SAB TAB Ectopic Multiple Live Births   0 1 0 0 1       Home Medications    Prior to Admission medications   Medication Sig Start Date End Date Taking? Authorizing Provider  bismuth  subsalicylate (PEPTO BISMOL) 262 MG/15ML suspension Take 30 mLs by mouth every 6 (six) hours as needed for diarrhea or loose stools.   Yes [provider]  ibuprofen (ADVIL,MOTRIN) 200 MG tablet Take 400 mg by mouth every 6 (six) hours as needed for moderate pain.   Yes [provider]  ondansetron (ZOFRAN) 4 MG tablet Take 1 tablet (4 mg total) by mouth every 6 (six) hours. 08/02/17   Kvion Shapley, Coral ElseMia A, PA-C    Family History Family History  Problem Relation Age of Onset  . Cancer Mother        cervical  . Hypertension Father   . Diabetes Brother   . Anesthesia problems Neg Hx     Social History Social History   Tobacco Use  . Smoking status: Former Smoker    Years: 5.00    Types: Cigarettes    Last attempt to quit: 08/31/2014    Years since quitting: 2.9  . Smokeless tobacco: Never Used  Substance Use Topics  . Alcohol use: No    Comment: 3-4 drinks occasionally  . Drug use: No     Allergies   Patient has no known allergies.   Review of Systems Review of Systems  Constitutional: Positive for chills and fever. Negative for activity change.  HENT: Negative for congestion.   Eyes: Negative for visual disturbance.  Respiratory: Negative for cough and shortness of breath.   Cardiovascular: Negative for chest pain.  Gastrointestinal: Positive for nausea and vomiting. Negative for abdominal distention, abdominal pain, anal bleeding, blood in stool, constipation and diarrhea.  Genitourinary: Negative for dysuria, flank pain, vaginal bleeding, vaginal discharge and vaginal pain.  Musculoskeletal: Negative for back pain, neck pain and neck stiffness.  Skin: Negative for rash.  Allergic/Immunologic: Negative for immunocompromised state.  Neurological: Positive for headaches. Negative for seizures, weakness and light-headedness.   Physical Exam Updated Vital Signs BP 129/69 (BP Location: Right Arm)   Pulse (!) 105   Temp 98.6 F (37 C)   Resp 16   LMP  06/24/2017 (Exact Date)   SpO2 98%   Physical Exam  Constitutional: No distress.  Obese abdomen.   HENT:  Head: Normocephalic.  Eyes: Conjunctivae are normal. No scleral icterus.  Neck: Normal range of motion. Neck supple.  No meningeal signs.  Full active and passive range of motion of the neck.  Cardiovascular: Regular rhythm, normal heart sounds and intact distal pulses. Tachycardia present. Exam reveals no gallop and no friction rub.  No murmur heard. Pulmonary/Chest: Effort normal and breath sounds normal. No stridor. No respiratory distress. She has no wheezes. She has no rales. She exhibits no tenderness.  Abdominal: Soft. Bowel sounds are normal. She exhibits no distension and no mass. There is no tenderness. There is no rebound and no guarding. No hernia.  Abdominal exam is unremarkable.  No peritoneal signs.  No CVA tenderness bilaterally.  Musculoskeletal: Normal range of motion. She exhibits no edema, tenderness or deformity.  Neurological: She is alert.  Cranial nerves 2-12 intact. Finger-to-nose is normal. 5/5 motor strength of the bilateral upper and lower extremities. Moves all four extremities. Negative Romberg.  Symmetric tandem gait. NVI.    Skin: Skin is warm. No rash noted. She is not diaphoretic.  Psychiatric: Her behavior is normal.  Nursing note and vitals reviewed.    ED Treatments / Results  Labs (all labs ordered are listed, but only abnormal results are displayed) Labs Reviewed  COMPREHENSIVE METABOLIC PANEL - Abnormal; Notable for the following components:      Result Value   Glucose, Bld 114 (*)    AST 42 (*)    ALT 57 (*)    Total Bilirubin 1.3 (*)    All other components within normal limits  CBC - Abnormal; Notable for the following components:   WBC 12.2 (*)    RBC 5.40 (*)    All other components within normal limits  URINALYSIS, ROUTINE W REFLEX MICROSCOPIC - Abnormal; Notable for the following components:   APPearance HAZY (*)     Protein, ur 30 (*)    Leukocytes, UA TRACE (*)    Squamous Epithelial / LPF 6-30 (*)    All other components within normal limits  LIPASE, BLOOD  I-STAT BETA HCG BLOOD, ED ([facility], [facility], [facility] ONLY)    EKG  EKG Interpretation None       Radiology No results found.  Procedures Procedures (including critical care time)  Medications Ordered in ED Medications  sodium chloride 0.9 % bolus 1,000 mL (0 mLs Intravenous Stopped 08/02/17 2355)  ondansetron (ZOFRAN) injection 4 mg (4 mg Intravenous Given 08/02/17 2202)  ketorolac (TORADOL) 30 MG/ML injection 30 mg (30 mg Intravenous Given 08/02/17 2339)     Initial Impression / Assessment and Plan / ED Course  I have  reviewed the triage vital signs and the nursing notes.  Pertinent labs & imaging results that were available during my care of the patient were reviewed by me and considered in my medical decision making (see chart for details).     26 year old female presenting with fever, chills, nausea, vomiting, and loose stools. Emesis began ~15 hours ago. Fever ~5-6 hours PTA, but resolved spontaneously in the ED. Labs are reassuring. On physical exam, her abdominal exam is benign. No further emergency workup or imaging is indicated at this time.  Doubt meningitis, influenza, pancreatitis, or cholecystitis.  IVF and Zofran given. The patient was successfully fluid challenged. I suspect viral etiology of her symptoms given the onset and course.  Will discharge the patient home with Zofran.  Strict return precautions given.  No acute distress.  Patient is safe for discharge at this time.  Final Clinical Impressions(s) / ED Diagnoses   Final diagnoses:  Non-intractable vomiting with nausea, unspecified vomiting type  Viral illness    ED Discharge Orders        Ordered    ondansetron (ZOFRAN) 4 MG tablet  Every 6 hours     08/02/17 2336       Frederik PearMcDonald, Kevork Joyce A, PA-C 08/03/17 0015    Rolan BuccoBelfi, Melanie, MD 08/04/17 [phone]

## 2017-08-29 NOTE — L&D Delivery Note (Signed)
Patient was C/C/+3 and pushed for 2 minutes with epidural.    NSVD  female infant, Apgars 8,9, weight P.   The patient had no lacerations. Fundus was firm. EBL was expected amount. Placenta was delivered intact. Vagina was clear.  Delayed cord clamping done for 30-60 seconds while warming baby. Baby was vigorous and doing skin to skin with mother.  Serenah Mill A

## 2017-11-22 ENCOUNTER — Inpatient Hospital Stay (HOSPITAL_COMMUNITY)
Admission: AD | Admit: 2017-11-22 | Discharge: 2017-11-22 | Disposition: A | Discharge status: Home / Self Care | Payer: 59 | Source: Ambulatory Visit | Attending: Obstetrics and Gynecology | Admitting: Obstetrics and Gynecology

## 2017-11-22 ENCOUNTER — Encounter (HOSPITAL_COMMUNITY): Payer: Self-pay | Admitting: *Deleted

## 2017-11-22 DIAGNOSIS — I10 Essential (primary) hypertension: Secondary | ICD-10-CM | POA: Clinically undetermined

## 2017-11-22 DIAGNOSIS — N912 Amenorrhea, unspecified: Secondary | ICD-10-CM | POA: Insufficient documentation

## 2017-11-22 NOTE — Discharge Instructions (Signed)
In late 2019, the [facility] will be moving to the [place] campus. At that time, the MAU (Maternity Admissions Unit), where you are being seen today, will no longer take care of non-pregnant patients. We strongly encourage you to find a doctor's office before that time, so that you can be seen with any GYN concerns, like vaginal discharge, urinary tract infection, etc.. in a timely manner. ° °In order to make an office visit more convenient, the [facility] will be offering evening hours with same-day appointments, walk-in appointments and scheduled appointments available during this time. ° °[facility] Hours: °Monday - 8am - 7:30 pm with walk-in between 4pm- 7:30 pm °Tuesday - 8 am - 5 pm (starting 11/28/17 we will be open late and accepting walk-ins from 4pm - 7:30pm) °Wednesday - 8 am - 5 pm (starting 02/28/18 we will be open late and accepting walk-ins from 4pm - 7:30pm) °Thursday 8 am - 5 pm (starting 05/31/18 we will be open late and accepting walk-ins from 4pm - 7:30pm) °Friday 8 am - 5 pm ° °For an appointment please call the [facility] at [phone] ° °For urgent needs, [facility] is also available for management of urgent GYN complaints such as vaginal discharge or urinary tract infections. ° ° ° ° ° °

## 2017-11-22 NOTE — MAU Provider Note (Signed)
[name] is a 27 y.o. G3P1010[name] who present to MAU today for pregnancy confirmation. She denies abdominal pain or vaginal bleeding. She reports that she has very irregular cycles, and had a negative UPT at home in December. This week it was +. She states that she called her OB office, and they wanted her to be seen for a dating US however she can't get to the office due to her work schedule. So she is here today for a dating ultrasound and pregnancy confirmation.    BP (!) 150/85 (BP Location: Right Arm)   Pulse (!) 101   Temp 98.5 F (36.9 C) (Oral)   Resp 19   Ht 5\' 5"  (1.651 m)   Wt 121.6 kg (268 lb)   LMP  (LMP Unknown)   SpO2 100%   BMI 44.60 kg/m  CONSTITUTIONAL: Well-developed, well-nourished female in no acute distress.  CARDIOVASCULAR: Normal heart rate noted RESPIRATORY: Effort and breath sounds normal GASTROINTESTINAL:Soft, no distention noted.  No tenderness, rebound or guarding.  SKIN: Skin is warm and dry. No rash noted. Not diaphoretic. No erythema. No pallor. PSYCHIATRIC: Normal mood and affect. Normal behavior. Normal judgment and thought content.  MDM Medical screening exam complete Patient does not endorse any symptoms concerning for ectopic pregnancy or pregnancy related complication today.   A:  Amenorrhea  P: Discharge home Patient advised that she can present as a walk-in to [facility] for a pregnancy test M-Th between 8am-4pm or Friday between 8am -11am Reasons to return to MAU reviewed  Patient may return to MAU as needed or if her condition were to change or worsen  Armando ReichertHogan, Newman Waren D, CNM 11/22/2017 6:08 PM

## 2017-11-22 NOTE — MAU Note (Signed)
Pt reports she has not had a period since October, negative preg test in December. Reports she has gone long periods without a period before. Reports a positive preg test today.

## 2017-11-27 ENCOUNTER — Inpatient Hospital Stay (HOSPITAL_COMMUNITY): Payer: 59

## 2017-11-27 ENCOUNTER — Encounter (HOSPITAL_COMMUNITY): Payer: Self-pay | Admitting: *Deleted

## 2017-11-27 ENCOUNTER — Other Ambulatory Visit: Payer: Self-pay

## 2017-11-27 ENCOUNTER — Inpatient Hospital Stay (HOSPITAL_COMMUNITY)
Admission: AD | Admit: 2017-11-27 | Discharge: 2017-11-27 | Disposition: A | Discharge status: Home / Self Care | Payer: 59 | Source: Ambulatory Visit | Attending: Obstetrics and Gynecology | Admitting: Obstetrics and Gynecology

## 2017-11-27 DIAGNOSIS — N949 Unspecified condition associated with female genital organs and menstrual cycle: Secondary | ICD-10-CM

## 2017-11-27 DIAGNOSIS — B9689 Other specified bacterial agents as the cause of diseases classified elsewhere: Secondary | ICD-10-CM | POA: Insufficient documentation

## 2017-11-27 DIAGNOSIS — O26892 Other specified pregnancy related conditions, second trimester: Secondary | ICD-10-CM

## 2017-11-27 DIAGNOSIS — O23592 Infection of other part of genital tract in pregnancy, second trimester: Secondary | ICD-10-CM | POA: Diagnosis not present

## 2017-11-27 DIAGNOSIS — Z6841 Body Mass Index (BMI) 40.0 and over, adult: Secondary | ICD-10-CM

## 2017-11-27 DIAGNOSIS — N760 Acute vaginitis: Secondary | ICD-10-CM

## 2017-11-27 DIAGNOSIS — Z3A18 18 weeks gestation of pregnancy: Secondary | ICD-10-CM

## 2017-11-27 DIAGNOSIS — Z3492 Encounter for supervision of normal pregnancy, unspecified, second trimester: Secondary | ICD-10-CM

## 2017-11-27 DIAGNOSIS — O26899 Other specified pregnancy related conditions, unspecified trimester: Secondary | ICD-10-CM

## 2017-11-27 DIAGNOSIS — Z87891 Personal history of nicotine dependence: Secondary | ICD-10-CM | POA: Diagnosis not present

## 2017-11-27 DIAGNOSIS — R109 Unspecified abdominal pain: Secondary | ICD-10-CM | POA: Diagnosis present

## 2017-11-27 DIAGNOSIS — Z3A17 17 weeks gestation of pregnancy: Secondary | ICD-10-CM | POA: Diagnosis not present

## 2017-11-27 LAB — URINALYSIS, ROUTINE W REFLEX MICROSCOPIC
BACTERIA UA: NONE SEEN
BILIRUBIN URINE: NEGATIVE
Glucose, UA: NEGATIVE mg/dL
HGB URINE DIPSTICK: NEGATIVE
Ketones, ur: 5 mg/dL — AB
NITRITE: NEGATIVE
PROTEIN: NEGATIVE mg/dL
Specific Gravity, Urine: 1.027 (ref 1.005–1.030)
pH: 5 (ref 5.0–8.0)

## 2017-11-27 LAB — OB RESULTS CONSOLE GC/CHLAMYDIA
CHLAMYDIA, DNA PROBE: NEGATIVE
Gonorrhea: NEGATIVE

## 2017-11-27 LAB — CBC
HCT: 38 % (ref 36.0–46.0)
Hemoglobin: 13 g/dL (ref 12.0–15.0)
MCH: 27.1 pg (ref 26.0–34.0)
MCHC: 34.2 g/dL (ref 30.0–36.0)
MCV: 79.2 fL (ref 78.0–100.0)
Platelets: 192 10*3/uL (ref 150–400)
RBC: 4.8 MIL/uL (ref 3.87–5.11)
RDW: 13.8 % (ref 11.5–15.5)
WBC: 13.5 10*3/uL — ABNORMAL HIGH (ref 4.0–10.5)

## 2017-11-27 LAB — WET PREP, GENITAL
SPERM: NONE SEEN
Trich, Wet Prep: NONE SEEN
Yeast Wet Prep HPF POC: NONE SEEN

## 2017-11-27 LAB — OB RESULTS CONSOLE ABO/RH: Rh Type: NEGATIVE

## 2017-11-27 LAB — OB RESULTS CONSOLE RPR: RPR: NONREACTIVE

## 2017-11-27 LAB — POCT PREGNANCY, URINE: PREG TEST UR: POSITIVE — AB

## 2017-11-27 LAB — OB RESULTS CONSOLE HEPATITIS B SURFACE ANTIGEN: Hepatitis B Surface Ag: NEGATIVE

## 2017-11-27 LAB — OB RESULTS CONSOLE HIV ANTIBODY (ROUTINE TESTING): HIV: NONREACTIVE

## 2017-11-27 LAB — HCG, QUANTITATIVE, PREGNANCY: HCG, BETA CHAIN, QUANT, S: 3100 m[IU]/mL — AB (ref <5)

## 2017-11-27 LAB — OB RESULTS CONSOLE ANTIBODY SCREEN: ANTIBODY SCREEN: NEGATIVE

## 2017-11-27 LAB — OB RESULTS CONSOLE RUBELLA ANTIBODY, IGM: Rubella: IMMUNE

## 2017-11-27 LAB — ABO/RH: ABO/RH(D): O NEG

## 2017-11-27 MED ORDER — PRENATAL 19 29-1 MG PO TABS
1.0000 | ORAL_TABLET | Freq: Every day | ORAL | 10 refills | Status: AC
Start: 2017-11-27 — End: 2017-12-27

## 2017-11-27 MED ORDER — METRONIDAZOLE 500 MG PO TABS: 500.0000 mg | ORAL_TABLET | Freq: Two times a day (BID) | ORAL | 0 refills | Status: DC

## 2017-11-27 MED ORDER — PRENATAL 19 29-1 MG PO TABS: 1.0000 | ORAL_TABLET | Freq: Every day | ORAL | 11 refills | Status: DC

## 2017-11-27 NOTE — Discharge Instructions (Signed)
Follow up in the office for prenatal care Get the prescribion for metronidazole and take for bacterial vaginosis No smoking, no drugs, no alcohol. Take a prenatal vitamin daily  Take Tylenol 325 mg 2 tablets by mouth every 4 hours if needed for pain. Drink at least 8 8-oz glasses of water every day.

## 2017-11-27 NOTE — MAU Note (Signed)
Having real bad pain, starts in RLQ goes up side. Called office, was told to come here. No bleeding.  Last night the pain was really bad, still feels it, but not like it was.  Had hormone blood check last wk in office, was over 2000.

## 2017-11-27 NOTE — MAU Provider Note (Signed)
History     CSN: [id number]  Arrival date and time: 11/27/17 1754   First Provider Initiated Contact with Patient 11/27/17 1944      Chief Complaint  Patient presents with  . Abdominal Pain   HPI Carly Rush 26 y.o.  LMP was in October - unsure of date. Had a positive pregnancy test last week.  Comes to MAU with right groin pain since Saturday.  Last night the pain was awakening her through the night.  Realized she was pregnant on 11-22-17.  Was seen in the office on 11-24-17 and had serum BHCG done - was 2000 per patient.  Today she was having pain and was advised to come to MAU.  Has not had any vaginal bleeding.  Her last child is one year old.  She was having irregular menses and did not realize she could become pregnant.   OB History    Gravida  4   Para  2   Term  2   Preterm  0   AB  1   Living  1     SAB  0   TAB  1   Ectopic  0   Multiple  0   Live Births  2           Past Medical History:  Diagnosis Date  . Asthma   . Chlamydia 2011  . Gastric reflux   . Gonorrhea 2011  . Headache(784.0)   . Hypertension   . Pregnancy induced hypertension   . Von Willebrand disease (HCC)     Past Surgical History:  Procedure Laterality Date  . INDUCED ABORTION    . TONSILLECTOMY    . WISDOM TOOTH EXTRACTION      Family History  Problem Relation Age of Onset  . Cancer Mother        cervical  . Hypertension Father   . Diabetes Brother   . Anesthesia problems Neg Hx     Social History   Tobacco Use  . Smoking status: Former Smoker    Years: 5.00    Types: Cigarettes    Last attempt to quit: 08/31/2014    Years since quitting: 3.2  . Smokeless tobacco: Never Used  Substance Use Topics  . Alcohol use: Yes    Comment: 3-4 drinks occasionally  . Drug use: No    Allergies: No Known Allergies  Medications Prior to Admission  Medication Sig Dispense Refill Last Dose  . bismuth subsalicylate (PEPTO BISMOL) 262 MG/15ML suspension Take 30 mLs by  mouth every 6 (six) hours as needed for diarrhea or loose stools.   08/02/2017 at Unknown time  . ibuprofen (ADVIL,MOTRIN) 200 MG tablet Take 400 mg by mouth every 6 (six) hours as needed for moderate pain.   08/02/2017 at Unknown time  . ondansetron (ZOFRAN) 4 MG tablet Take 1 tablet (4 mg total) by mouth every 6 (six) hours. 12 tablet 0     Review of Systems  Constitutional: Negative for fever.  Gastrointestinal: Positive for abdominal pain and nausea. Negative for diarrhea and vomiting.  Genitourinary: Positive for vaginal discharge. Negative for dysuria and vaginal bleeding.   Physical Exam   Blood pressure 133/68, pulse 88, temperature 98.6 F (37 C), temperature source Oral, resp. rate 20, height 5\' 5"  (1.651 m), weight 270 lb 8 oz (122.7 kg), last menstrual period 08/20/2017, SpO2 100 %, unknown if currently breastfeeding.  Physical Exam  Nursing note and vitals reviewed. Constitutional: She is oriented to person, place,  and time. She appears well-developed and well-nourished.  Morbid obesity  HENT:  Head: Normocephalic.  Eyes: EOM are normal.  Neck: Neck supple.  GI: Soft. There is tenderness. There is no rebound and no guarding.  Pain in RLQ near right groin.  Genitourinary:  Genitourinary Comments: Speculum exam: Vagina - Mod amount of white frothy discharge, no odor Cervix - No contact bleeding Bimanual exam: Cervix closed Uterus Unable to size due to habitus Adnexa Unable to evaluate due to habitus GC/Chlam, wet prep done Chaperone present for exam.   Musculoskeletal: Normal range of motion.  Neurological: She is alert and oriented to person, place, and time.  Skin: Skin is warm and dry.  Psychiatric: She has a normal mood and affect.    MAU Course  Procedures Results for orders placed or performed during the hospital encounter of 11/27/17 (from the past 24 hour(s))  Urinalysis, Routine w reflex microscopic     Status: Abnormal   Collection Time: 11/27/17  6:51  PM  Result Value Ref Range   Color, Urine YELLOW YELLOW   APPearance CLOUDY (A) CLEAR   Specific Gravity, Urine 1.027 1.005 - 1.030   pH 5.0 5.0 - 8.0   Glucose, UA NEGATIVE NEGATIVE mg/dL   Hgb urine dipstick NEGATIVE NEGATIVE   Bilirubin Urine NEGATIVE NEGATIVE   Ketones, ur 5 (A) NEGATIVE mg/dL   Protein, ur NEGATIVE NEGATIVE mg/dL   Nitrite NEGATIVE NEGATIVE   Leukocytes, UA TRACE (A) NEGATIVE   RBC / HPF 0-5 0 - 5 RBC/hpf   WBC, UA 0-5 0 - 5 WBC/hpf   Bacteria, UA NONE SEEN NONE SEEN   Squamous Epithelial / LPF TOO NUMEROUS TO COUNT (A) NONE SEEN   Mucus PRESENT   Pregnancy, urine POC     Status: Abnormal   Collection Time: 11/27/17  7:03 PM  Result Value Ref Range   Preg Test, Ur POSITIVE (A) NEGATIVE  CBC     Status: Abnormal   Collection Time: 11/27/17  7:50 PM  Result Value Ref Range   WBC 13.5 (H) 4.0 - 10.5 K/uL   RBC 4.80 3.87 - 5.11 MIL/uL   Hemoglobin 13.0 12.0 - 15.0 g/dL   HCT 40.938.0 81.136.0 - 91.446.0 %   MCV 79.2 78.0 - 100.0 fL   MCH 27.1 26.0 - 34.0 pg   MCHC 34.2 30.0 - 36.0 g/dL   RDW 78.213.8 95.611.5 - 21.315.5 %   Platelets 192 150 - 400 K/uL  hCG, quantitative, pregnancy     Status: Abnormal   Collection Time: 11/27/17  7:50 PM  Result Value Ref Range   hCG, Beta Chain, Quant, S 3,100 (H) <5 mIU/mL  ABO/Rh     Status: None   Collection Time: 11/27/17  7:50 PM  Result Value Ref Range   ABO/RH(D)      O [facility]G Performed [facility], [address] [place], [place] [address]   Wet prep, genital     Status: Abnormal   Collection Time: 11/27/17  8:07 PM  Result Value Ref Range   Yeast Wet Prep HPF POC NONE SEEN NONE SEEN   Trich, Wet Prep NONE SEEN NONE SEEN   Clue Cells Wet Prep HPF POC PRESENT (A) NONE SEEN   WBC, Wet Prep HPF POC MANY (A) NONE SEEN   Sperm NONE SEEN     MDM Sent to US and was [id number] - limited US done.  Still needs an anatomy ultrasound. Discussed the plan of care with Dr. Claiborne Billingsallahan  Assessment and Plan  Round ligament pain - likely  cause of the pain you are having Pregnancy at 17 weeks by ultrasound Bacterial vaginosis  Plan Follow up in the office for prenatal care Will prescribe metronidazole for her bacterial vaginosis No smoking, no drugs, no alcohol. Take a prenatal vitamin daily  Take Tylenol 325 mg 2 tablets by mouth every 4 hours if needed for pain. Drink at least 8 8-oz glasses of water every day.   Carly Rush 11/27/2017, 8:48 PM

## 2017-11-28 DIAGNOSIS — O26899 Other specified pregnancy related conditions, unspecified trimester: Secondary | ICD-10-CM

## 2017-11-28 DIAGNOSIS — Z3A18 18 weeks gestation of pregnancy: Secondary | ICD-10-CM

## 2017-11-28 DIAGNOSIS — R109 Unspecified abdominal pain: Secondary | ICD-10-CM

## 2017-11-28 LAB — GC/CHLAMYDIA PROBE AMP (~~LOC~~) NOT AT ARMC
CHLAMYDIA, DNA PROBE: NEGATIVE
NEISSERIA GONORRHEA: NEGATIVE

## 2018-01-02 ENCOUNTER — Other Ambulatory Visit (HOSPITAL_COMMUNITY): Payer: Self-pay | Admitting: Obstetrics and Gynecology

## 2018-01-02 DIAGNOSIS — Z3689 Encounter for other specified antenatal screening: Secondary | ICD-10-CM

## 2018-01-02 DIAGNOSIS — Z3A25 25 weeks gestation of pregnancy: Secondary | ICD-10-CM

## 2018-01-12 ENCOUNTER — Other Ambulatory Visit (HOSPITAL_COMMUNITY): Payer: Self-pay | Admitting: *Deleted

## 2018-01-12 ENCOUNTER — Ambulatory Visit (HOSPITAL_COMMUNITY)
Admission: RE | Admit: 2018-01-12 | Discharge: 2018-01-12 | Disposition: A | Discharge status: Home / Self Care | Payer: 59 | Source: Ambulatory Visit | Attending: Obstetrics and Gynecology | Admitting: Obstetrics and Gynecology

## 2018-01-12 ENCOUNTER — Encounter (HOSPITAL_COMMUNITY): Payer: Self-pay

## 2018-01-12 DIAGNOSIS — Z3A23 23 weeks gestation of pregnancy: Secondary | ICD-10-CM | POA: Diagnosis not present

## 2018-01-12 DIAGNOSIS — O321XX0 Maternal care for breech presentation, not applicable or unspecified: Secondary | ICD-10-CM | POA: Insufficient documentation

## 2018-01-12 DIAGNOSIS — O99212 Obesity complicating pregnancy, second trimester: Secondary | ICD-10-CM | POA: Diagnosis present

## 2018-01-12 DIAGNOSIS — Z3A25 25 weeks gestation of pregnancy: Secondary | ICD-10-CM

## 2018-01-12 DIAGNOSIS — O99210 Obesity complicating pregnancy, unspecified trimester: Secondary | ICD-10-CM

## 2018-01-12 DIAGNOSIS — Z3689 Encounter for other specified antenatal screening: Secondary | ICD-10-CM | POA: Insufficient documentation

## 2018-02-09 ENCOUNTER — Ambulatory Visit (HOSPITAL_COMMUNITY)
Admission: RE | Admit: 2018-02-09 | Discharge: 2018-02-09 | Disposition: A | Discharge status: Home / Self Care | Payer: 59 | Source: Ambulatory Visit | Attending: Obstetrics and Gynecology | Admitting: Obstetrics and Gynecology

## 2018-02-09 ENCOUNTER — Ambulatory Visit (HOSPITAL_COMMUNITY): Payer: 59

## 2018-02-09 ENCOUNTER — Encounter (HOSPITAL_COMMUNITY): Payer: Self-pay

## 2018-02-09 DIAGNOSIS — O99210 Obesity complicating pregnancy, unspecified trimester: Secondary | ICD-10-CM | POA: Diagnosis present

## 2018-02-09 DIAGNOSIS — Z3A27 27 weeks gestation of pregnancy: Secondary | ICD-10-CM | POA: Insufficient documentation

## 2018-03-09 ENCOUNTER — Other Ambulatory Visit (HOSPITAL_COMMUNITY): Payer: Self-pay | Admitting: Obstetrics and Gynecology

## 2018-03-09 ENCOUNTER — Encounter (HOSPITAL_COMMUNITY): Payer: Self-pay

## 2018-03-09 ENCOUNTER — Ambulatory Visit (HOSPITAL_COMMUNITY)
Admission: RE | Admit: 2018-03-09 | Discharge: 2018-03-09 | Disposition: A | Discharge status: Home / Self Care | Payer: 59 | Source: Ambulatory Visit | Attending: Obstetrics and Gynecology | Admitting: Obstetrics and Gynecology

## 2018-03-09 ENCOUNTER — Other Ambulatory Visit (HOSPITAL_COMMUNITY): Payer: Self-pay | Admitting: *Deleted

## 2018-03-09 DIAGNOSIS — O09293 Supervision of pregnancy with other poor reproductive or obstetric history, third trimester: Secondary | ICD-10-CM | POA: Diagnosis not present

## 2018-03-09 DIAGNOSIS — Z362 Encounter for other antenatal screening follow-up: Secondary | ICD-10-CM

## 2018-03-09 DIAGNOSIS — O99210 Obesity complicating pregnancy, unspecified trimester: Secondary | ICD-10-CM

## 2018-03-09 DIAGNOSIS — O99213 Obesity complicating pregnancy, third trimester: Secondary | ICD-10-CM | POA: Diagnosis not present

## 2018-03-09 DIAGNOSIS — Z3A32 32 weeks gestation of pregnancy: Secondary | ICD-10-CM

## 2018-03-09 DIAGNOSIS — D6800 Von Willebrand disease, unspecified: Secondary | ICD-10-CM

## 2018-03-09 DIAGNOSIS — Z3A31 31 weeks gestation of pregnancy: Secondary | ICD-10-CM | POA: Diagnosis not present

## 2018-03-09 DIAGNOSIS — E669 Obesity, unspecified: Secondary | ICD-10-CM | POA: Diagnosis present

## 2018-03-09 DIAGNOSIS — O09299 Supervision of pregnancy with other poor reproductive or obstetric history, unspecified trimester: Secondary | ICD-10-CM

## 2018-03-09 DIAGNOSIS — O10919 Unspecified pre-existing hypertension complicating pregnancy, unspecified trimester: Secondary | ICD-10-CM

## 2018-03-09 DIAGNOSIS — O163 Unspecified maternal hypertension, third trimester: Secondary | ICD-10-CM

## 2018-03-09 DIAGNOSIS — O10013 Pre-existing essential hypertension complicating pregnancy, third trimester: Secondary | ICD-10-CM | POA: Diagnosis present

## 2018-03-22 ENCOUNTER — Inpatient Hospital Stay (HOSPITAL_BASED_OUTPATIENT_CLINIC_OR_DEPARTMENT_OTHER): Payer: 59

## 2018-03-22 ENCOUNTER — Other Ambulatory Visit: Payer: Self-pay

## 2018-03-22 ENCOUNTER — Encounter (HOSPITAL_COMMUNITY): Payer: Self-pay

## 2018-03-22 ENCOUNTER — Inpatient Hospital Stay (HOSPITAL_COMMUNITY)
Admission: AD | Admit: 2018-03-22 | Discharge: 2018-03-22 | Disposition: A | Discharge status: Home / Self Care | Payer: 59 | Source: Ambulatory Visit | Attending: Obstetrics and Gynecology | Admitting: Obstetrics and Gynecology

## 2018-03-22 DIAGNOSIS — O368130 Decreased fetal movements, third trimester, not applicable or unspecified: Secondary | ICD-10-CM | POA: Insufficient documentation

## 2018-03-22 DIAGNOSIS — Z8059 Family history of malignant neoplasm of other urinary tract organ: Secondary | ICD-10-CM | POA: Insufficient documentation

## 2018-03-22 DIAGNOSIS — J45909 Unspecified asthma, uncomplicated: Secondary | ICD-10-CM | POA: Diagnosis not present

## 2018-03-22 DIAGNOSIS — O10013 Pre-existing essential hypertension complicating pregnancy, third trimester: Secondary | ICD-10-CM

## 2018-03-22 DIAGNOSIS — Z833 Family history of diabetes mellitus: Secondary | ICD-10-CM | POA: Diagnosis not present

## 2018-03-22 DIAGNOSIS — Z79899 Other long term (current) drug therapy: Secondary | ICD-10-CM | POA: Diagnosis not present

## 2018-03-22 DIAGNOSIS — O99213 Obesity complicating pregnancy, third trimester: Secondary | ICD-10-CM

## 2018-03-22 DIAGNOSIS — O09293 Supervision of pregnancy with other poor reproductive or obstetric history, third trimester: Secondary | ICD-10-CM

## 2018-03-22 DIAGNOSIS — Z3A34 34 weeks gestation of pregnancy: Secondary | ICD-10-CM | POA: Insufficient documentation

## 2018-03-22 DIAGNOSIS — Z8049 Family history of malignant neoplasm of other genital organs: Secondary | ICD-10-CM | POA: Insufficient documentation

## 2018-03-22 DIAGNOSIS — O99113 Other diseases of the blood and blood-forming organs and certain disorders involving the immune mechanism complicating pregnancy, third trimester: Secondary | ICD-10-CM | POA: Diagnosis not present

## 2018-03-22 DIAGNOSIS — Z87891 Personal history of nicotine dependence: Secondary | ICD-10-CM | POA: Insufficient documentation

## 2018-03-22 DIAGNOSIS — O163 Unspecified maternal hypertension, third trimester: Secondary | ICD-10-CM | POA: Insufficient documentation

## 2018-03-22 DIAGNOSIS — D680 Von Willebrand's disease: Secondary | ICD-10-CM | POA: Insufficient documentation

## 2018-03-22 DIAGNOSIS — Z8249 Family history of ischemic heart disease and other diseases of the circulatory system: Secondary | ICD-10-CM | POA: Diagnosis not present

## 2018-03-22 DIAGNOSIS — O99513 Diseases of the respiratory system complicating pregnancy, third trimester: Secondary | ICD-10-CM | POA: Insufficient documentation

## 2018-03-22 DIAGNOSIS — O4103X0 Oligohydramnios, third trimester, not applicable or unspecified: Secondary | ICD-10-CM | POA: Insufficient documentation

## 2018-03-22 DIAGNOSIS — Z3A33 33 weeks gestation of pregnancy: Secondary | ICD-10-CM

## 2018-03-22 NOTE — MAU Provider Note (Signed)
History     CSN: [id number]  Arrival date and time: 03/22/18 1446   First Provider Initiated Contact with Patient 03/22/18 1543      Chief Complaint  Patient presents with  . Decreased Fetal Movement   Carly PloverCorina Abascal is a 27 y.o. G4P2011 at 768w3d presented with decreased fetal movement.  She states she has felt no movement for the previous 5 hours. Denies contractions, leakage of fluid or vaginal bleeding. She has obesity and CHTN, controlled off meds. Last US at 32 weeks: 61st %ile, AFI 8.59.    OB History  Gravida Para Term Preterm AB Living  4 2 2  0 1 1  SAB TAB Ectopic Multiple Live Births  0 1 0 0 2    # Outcome Date GA Lbr Len/2nd Weight Sex Delivery Anes PTL Lv  4 Current           3 Term 05/29/13 7720w3d / 01:14 6 lb 15.1 oz (3.15 kg) M Vag-Spont EPI  DEC     Birth Comments: baby died @ age 728 mos. of unknown cause  2 Term      Vag-Spont   LIV  1 TAB              Past Medical History:  Diagnosis Date  . Asthma   . Chlamydia 2011  . Gastric reflux   . Gonorrhea 2011  . Headache(784.0)   . Hypertension   . Pregnancy induced hypertension   . Von Willebrand disease (HCC)     Past Surgical History:  Procedure Laterality Date  . INDUCED ABORTION    . TONSILLECTOMY    . WISDOM TOOTH EXTRACTION      Family History  Problem Relation Age of Onset  . Cancer Mother        cervical  . Hypertension Father   . Diabetes Brother   . Anesthesia problems Neg Hx     Social History   Tobacco Use  . Smoking status: Former Smoker    Years: 5.00    Types: Cigarettes    Last attempt to quit: 08/31/2014    Years since quitting: 3.5  . Smokeless tobacco: Never Used  Substance Use Topics  . Alcohol use: Not Currently    Comment: 3-4 drinks occasionally  . Drug use: No    Allergies: No Known Allergies  Medications Prior to Admission  Medication Sig Dispense Refill Last Dose  . calcium carbonate (TUMS - DOSED IN MG ELEMENTAL CALCIUM) 500 MG chewable tablet Chew 1  tablet by mouth daily as needed for indigestion or heartburn.   Past Month at Unknown time  . Prenatal Vit-Fe Fumarate-FA (PRENATAL MULTIVITAMIN) TABS tablet Take 1 tablet by mouth daily at 12 noon.   03/22/2018 at Unknown time    Review of Systems Physical Exam   Blood pressure 125/70, pulse 89, resp. rate 18, height 5\' 5"  (1.651 m), weight 286 lb 1.3 oz (129.8 kg), last menstrual period 08/20/2017, SpO2 99 %, unknown if currently breastfeeding.  Physical Exam  MAU Course  Procedures   Fetal monitoring Baseline FHR 150, moderate variability, accelerations present, no decelerations Toco: No contractions   BPP prelim result: 8/8, AFI 5.83 (<3rd %ile) [place] Mfm Fetal Bpp Wo Non Stress  Result Date: 03/22/2018 ----------------------------------------------------------------------  OBSTETRICS REPORT                      (Signed Final 03/22/2018 05:20 pm) ---------------------------------------------------------------------- Patient Info  ID #:  [id number]                          D.O.B.:  19-Jun-1991 (27 yrs)  Name:       Carly Rush                     Visit Date: 03/22/2018 04:35 pm ---------------------------------------------------------------------- Performed By  Performed By:     Marcellina Millin          Ref. Address:     [address]                                                             [place], [place]                                                             [address]  Attending:        Noralee Space MD        Location:         [facility]  Referred By:      Carrington Clamp MD ---------------------------------------------------------------------- Orders   #  Description                                 Code   1  [place] MFM FETAL BPP WO NON STRESS              [phone]  ----------------------------------------------------------------------   #  Ordered By                Order #        Accession #    Episode #   1  Brittini Brubeck              [id number]      [id number]     [id number]  ---------------------------------------------------------------------- Indications   [redacted] weeks gestation of pregnancy                Z3A.33   Decreased fetal movements, third trimester,    O36.8130   unspecified   History of congenital or genetic condition     Z87.798   (Von Willebrand Disease)   Poor obstetric history: Previous               O09.299   preeclampsia / eclampsia/gestational HTN   Maternal morbid obesity  O99.210 E66.01   Hypertension - Chronic/Pre-existing            O10.019  ---------------------------------------------------------------------- OB History  Blood Type:            Height:  5'5"   Weight (lb):  270       BMI:  44.93  Gravidity:    4         Term:   2        Prem:   0        SAB:   0  TOP:          1       Ectopic:  0        Living: 1 ---------------------------------------------------------------------- Fetal Evaluation  Num Of Fetuses:     1  Fetal Heart         165  Rate(bpm):  Cardiac Activity:   Observed  Presentation:       Transverse, head to maternal right  Amniotic Fluid  AFI FV:      Decreased  AFI Sum(cm)     %Tile       Largest Pocket(cm)  5.83            < 3         4.04  RUQ(cm)                     LUQ(cm)  4.04                        1.79 ---------------------------------------------------------------------- Biophysical Evaluation  Amniotic F.V:   Pocket => 2 cm two         F. Tone:        Observed                  planes  F. Movement:    Observed                   Score:          8/8  F. Breathing:   Observed ---------------------------------------------------------------------- Gestational Age  LMP:           39w 2d        Date:  06/20/17                 EDD:   03/27/18  Best:          33w 4d     Det. By:  U/S  (01/12/18)          EDD:   05/06/18 ---------------------------------------------------------------------- Impression  Patient is  being evaluated for decreased fetal movements.  Amniotic fluid is decreased for this gestational age (no  oligohydramnios). Good fetal activity is seen.  BPP 8/8. ---------------------------------------------------------------------- Recommendations  Follow-up scans as clinically indicated. ----------------------------------------------------------------------                  Noralee Space, MD Electronically Signed Final Report   03/22/2018 05:20 pm ----------------------------------------------------------------------  C/W Dr. Henderson Cloud advise patiet to call office in am to be seen tomorrow  Assessment and Plan   1. Decreased fetal movement affecting management of pregnancy in third trimester, single or unspecified fetus   2. [redacted] weeks gestation of pregnancy   3. Decreased amniotic fluid, third trimester, not applicable or unspecified fetus    Allergies as of 03/22/2018   No Known Allergies     Medication List    TAKE these medications   calcium  carbonate 500 MG chewable tablet Commonly known as:  TUMS - dosed in mg elemental calcium Chew 1 tablet by mouth daily as needed for indigestion or heartburn.   prenatal multivitamin Tabs tablet Take 1 tablet by mouth daily at 12 noon.      Follow-up Information    Ob/Gyn, Nestor Ramp Follow up in 1 day(s).   Why:  Call the office at 8 AM tomorrow for appointment to be seen tomorrow Contact information: [address] [place] [address] [place] [address] [phone]           Caren Griffins CNM 03/22/2018, 3:44 PM

## 2018-03-22 NOTE — MAU Note (Signed)
Pt. States she hasn't felt fetal movement for about 5 hours.  Pt. Denies vag bleeding, or dc

## 2018-03-22 NOTE — Discharge Instructions (Signed)

## 2018-03-26 LAB — OB RESULTS CONSOLE GBS: GBS: NEGATIVE

## 2018-04-06 ENCOUNTER — Other Ambulatory Visit (HOSPITAL_COMMUNITY): Payer: Self-pay | Admitting: Obstetrics and Gynecology

## 2018-04-06 ENCOUNTER — Encounter (HOSPITAL_COMMUNITY): Payer: Self-pay

## 2018-04-06 ENCOUNTER — Ambulatory Visit (HOSPITAL_COMMUNITY)
Admission: RE | Admit: 2018-04-06 | Discharge: 2018-04-06 | Disposition: A | Discharge status: Home / Self Care | Payer: 59 | Source: Ambulatory Visit | Attending: Obstetrics and Gynecology | Admitting: Obstetrics and Gynecology

## 2018-04-06 DIAGNOSIS — Z3A35 35 weeks gestation of pregnancy: Secondary | ICD-10-CM

## 2018-04-06 DIAGNOSIS — O10919 Unspecified pre-existing hypertension complicating pregnancy, unspecified trimester: Secondary | ICD-10-CM

## 2018-04-06 DIAGNOSIS — O09299 Supervision of pregnancy with other poor reproductive or obstetric history, unspecified trimester: Secondary | ICD-10-CM

## 2018-04-06 DIAGNOSIS — O10013 Pre-existing essential hypertension complicating pregnancy, third trimester: Secondary | ICD-10-CM

## 2018-04-06 DIAGNOSIS — Z3A38 38 weeks gestation of pregnancy: Secondary | ICD-10-CM

## 2018-04-06 DIAGNOSIS — Z3A37 37 weeks gestation of pregnancy: Secondary | ICD-10-CM

## 2018-04-06 DIAGNOSIS — Z87798 Personal history of other (corrected) congenital malformations: Secondary | ICD-10-CM

## 2018-04-06 DIAGNOSIS — O99213 Obesity complicating pregnancy, third trimester: Secondary | ICD-10-CM

## 2018-04-06 DIAGNOSIS — O10913 Unspecified pre-existing hypertension complicating pregnancy, third trimester: Secondary | ICD-10-CM

## 2018-04-06 DIAGNOSIS — O09293 Supervision of pregnancy with other poor reproductive or obstetric history, third trimester: Secondary | ICD-10-CM

## 2018-04-06 DIAGNOSIS — O99210 Obesity complicating pregnancy, unspecified trimester: Secondary | ICD-10-CM

## 2018-04-11 ENCOUNTER — Other Ambulatory Visit: Payer: Self-pay | Admitting: Obstetrics and Gynecology

## 2018-04-12 ENCOUNTER — Encounter (HOSPITAL_COMMUNITY): Payer: Self-pay | Admitting: *Deleted

## 2018-04-12 ENCOUNTER — Telehealth (HOSPITAL_COMMUNITY): Payer: Self-pay | Admitting: *Deleted

## 2018-04-12 NOTE — Telephone Encounter (Signed)
Preadmission screen  

## 2018-04-13 ENCOUNTER — Encounter (HOSPITAL_COMMUNITY): Payer: Self-pay | Admitting: *Deleted

## 2018-04-13 ENCOUNTER — Encounter (HOSPITAL_COMMUNITY): Payer: Self-pay

## 2018-04-13 ENCOUNTER — Ambulatory Visit (HOSPITAL_COMMUNITY)
Admission: RE | Admit: 2018-04-13 | Discharge: 2018-04-13 | Disposition: A | Discharge status: Home / Self Care | Payer: 59 | Source: Ambulatory Visit | Attending: Obstetrics and Gynecology | Admitting: Obstetrics and Gynecology

## 2018-04-13 ENCOUNTER — Other Ambulatory Visit (HOSPITAL_COMMUNITY): Payer: Self-pay | Admitting: Obstetrics and Gynecology

## 2018-04-13 DIAGNOSIS — O10013 Pre-existing essential hypertension complicating pregnancy, third trimester: Secondary | ICD-10-CM | POA: Insufficient documentation

## 2018-04-13 DIAGNOSIS — Z3A37 37 weeks gestation of pregnancy: Secondary | ICD-10-CM

## 2018-04-13 DIAGNOSIS — I10 Essential (primary) hypertension: Secondary | ICD-10-CM

## 2018-04-13 DIAGNOSIS — O10913 Unspecified pre-existing hypertension complicating pregnancy, third trimester: Secondary | ICD-10-CM | POA: Diagnosis present

## 2018-04-13 DIAGNOSIS — O09293 Supervision of pregnancy with other poor reproductive or obstetric history, third trimester: Secondary | ICD-10-CM | POA: Diagnosis not present

## 2018-04-13 NOTE — Procedures (Signed)
Carly PloverCorina Rush 1990-10-11 3984w4d  Fetus A Non-Stress Test Interpretation for 04/13/18  Indication: Unsatisfactory BPP  Fetal Heart Rate A Mode: External Baseline Rate (A): 145 bpm Variability: Moderate Accelerations: 15 x 15 Decelerations: None Multiple birth?: No  Uterine Activity Mode: Palpation, Toco Contraction Frequency (min): none  Interpretation (Fetal Testing) Nonstress Test Interpretation: Reactive Overall Impression: Reassuring for gestational age Comments: Reviewed tracing with Dr. Judeth CornfieldShankar

## 2018-04-16 ENCOUNTER — Other Ambulatory Visit: Payer: Self-pay | Admitting: Obstetrics and Gynecology

## 2018-04-19 ENCOUNTER — Inpatient Hospital Stay (HOSPITAL_COMMUNITY)
Admission: AD | Admit: 2018-04-19 | Discharge: 2018-04-19 | Disposition: A | Discharge status: Home / Self Care | Payer: 59 | Source: Ambulatory Visit | Attending: Obstetrics and Gynecology | Admitting: Obstetrics and Gynecology

## 2018-04-19 ENCOUNTER — Other Ambulatory Visit: Payer: Self-pay

## 2018-04-19 ENCOUNTER — Encounter (HOSPITAL_COMMUNITY): Payer: Self-pay

## 2018-04-19 DIAGNOSIS — O10913 Unspecified pre-existing hypertension complicating pregnancy, third trimester: Secondary | ICD-10-CM | POA: Diagnosis not present

## 2018-04-19 DIAGNOSIS — Z87891 Personal history of nicotine dependence: Secondary | ICD-10-CM | POA: Diagnosis not present

## 2018-04-19 DIAGNOSIS — Z3A38 38 weeks gestation of pregnancy: Secondary | ICD-10-CM | POA: Diagnosis not present

## 2018-04-19 DIAGNOSIS — O10013 Pre-existing essential hypertension complicating pregnancy, third trimester: Secondary | ICD-10-CM | POA: Diagnosis present

## 2018-04-19 LAB — CBC
HCT: 35.9 % — ABNORMAL LOW (ref 36.0–46.0)
Hemoglobin: 11.8 g/dL — ABNORMAL LOW (ref 12.0–15.0)
MCH: 26.2 pg (ref 26.0–34.0)
MCHC: 32.9 g/dL (ref 30.0–36.0)
MCV: 79.6 fL (ref 78.0–100.0)
Platelets: 168 10*3/uL (ref 150–400)
RBC: 4.51 MIL/uL (ref 3.87–5.11)
RDW: 15.3 % (ref 11.5–15.5)
WBC: 11.4 10*3/uL — ABNORMAL HIGH (ref 4.0–10.5)

## 2018-04-19 LAB — URINALYSIS, ROUTINE W REFLEX MICROSCOPIC
Bilirubin Urine: NEGATIVE
Glucose, UA: NEGATIVE mg/dL
Hgb urine dipstick: NEGATIVE
Ketones, ur: NEGATIVE mg/dL
Leukocytes, UA: NEGATIVE
Nitrite: NEGATIVE
Protein, ur: NEGATIVE mg/dL
Specific Gravity, Urine: 1.019 (ref 1.005–1.030)
pH: 6 (ref 5.0–8.0)

## 2018-04-19 LAB — COMPREHENSIVE METABOLIC PANEL
ALT: 14 U/L (ref 0–44)
AST: 15 U/L (ref 15–41)
Albumin: 3.1 g/dL — ABNORMAL LOW (ref 3.5–5.0)
Alkaline Phosphatase: 150 U/L — ABNORMAL HIGH (ref 38–126)
Anion gap: 8 (ref 5–15)
BUN: 9 mg/dL (ref 6–20)
CO2: 19 mmol/L — ABNORMAL LOW (ref 22–32)
Calcium: 8.9 mg/dL (ref 8.9–10.3)
Chloride: 108 mmol/L (ref 98–111)
Creatinine, Ser: 0.5 mg/dL (ref 0.44–1.00)
GFR calc Af Amer: >60 mL/min (ref >60)
GFR calc non Af Amer: >60 mL/min (ref >60)
Glucose, Bld: 100 mg/dL — ABNORMAL HIGH (ref 70–99)
Potassium: 3.9 mmol/L (ref 3.5–5.1)
Sodium: 135 mmol/L (ref 135–145)
Total Bilirubin: 0.2 mg/dL — ABNORMAL LOW (ref 0.3–1.2)
Total Protein: 6.3 g/dL — ABNORMAL LOW (ref 6.5–8.1)

## 2018-04-19 LAB — PROTEIN / CREATININE RATIO, URINE
Creatinine, Urine: 114 mg/dL
Protein Creatinine Ratio: 0.07 mg/mg{Cre} (ref 0.00–0.15)
Total Protein, Urine: 8 mg/dL

## 2018-04-19 MED ORDER — ACETAMINOPHEN 500 MG PO TABS
1000.0000 mg | ORAL_TABLET | Freq: Once | ORAL | Status: AC
  Administered 2018-04-19: 1000 mg via ORAL
  Filled 2018-04-19: qty 2

## 2018-04-19 NOTE — MAU Provider Note (Signed)
Chief Complaint: Headache and Hypertension   First Provider Initiated Contact with Patient 04/19/18 1903      HPI: Carly Rush is a 27 y.o. G4P2011 at 28w3dwho presents to maternity admissions reporting hypertension and headache. She reports being at work and noticed her feet being slightly swollen went to [employer] when she got off and checked her BP- reports BP was 151/82. She reports a hx of chronic hypertension. She reports drinking 3 bottles of water per day and that her feet usually are swollen during the whole pregnancy with her previous pregnancies, not just at the end of the pregnancy like today. She reports having a HA that has been present all day, rates pain 3/10- has not taken any medication for HA as she "does not like taking medicine". She denies abdominal pain or contractions. She reports good fetal movement, denies LOF, vaginal bleeding. Has induction scheduled for Tuesday 8/27.  Past Medical History: Past Medical History:  Diagnosis Date  . Asthma   . Chlamydia 2011  . Gastric reflux   . GERD (gastroesophageal reflux disease)   . Gonorrhea 2011  . Headache(784.0)   . Hypertension   . Pregnancy induced hypertension   . Von Willebrand disease (HCC)     Past obstetric history: OB History  Gravida Para Term Preterm AB Living  4 2 2  0 1 1  SAB TAB Ectopic Multiple Live Births  0 1 0 0 2    # Outcome Date GA Lbr Len/2nd Weight Sex Delivery Anes PTL Lv  4 Current           3 Term 2018    M Vag-Spont   LIV  2 Term 05/29/13 [redacted]w[redacted]d / 01:14 3150 g M Vag-Spont EPI  DEC     Birth Comments: baby died @ age 11 mos. of unknown cause  1 TAB             Past Surgical History: Past Surgical History:  Procedure Laterality Date  . DILATION AND CURETTAGE OF UTERUS    . INDUCED ABORTION    . TONSILLECTOMY    . WISDOM TOOTH EXTRACTION      Family History: Family History  Problem Relation Age of Onset  . Cancer Mother        cervical  . Hypertension Father   . Diabetes  Brother   . Anesthesia problems Neg Hx     Social History: Social History   Tobacco Use  . Smoking status: Former Smoker    Years: 5.00    Types: Cigarettes    Last attempt to quit: 08/31/2014    Years since quitting: 3.6  . Smokeless tobacco: Never Used  Substance Use Topics  . Alcohol use: Not Currently    Comment: 3-4 drinks occasionally  . Drug use: No    Allergies: No Known Allergies  Meds:  Medications Prior to Admission  Medication Sig Dispense Refill Last Dose  . calcium carbonate (TUMS - DOSED IN MG ELEMENTAL CALCIUM) 500 MG chewable tablet Chew 1 tablet by mouth daily as needed for indigestion or heartburn.   Taking  . Prenatal Vit-Fe Fumarate-FA (PRENATAL MULTIVITAMIN) TABS tablet Take 1 tablet by mouth daily at 12 noon.   Taking    ROS:  Review of Systems  Constitutional:       Hypertension  Respiratory: Negative.   Cardiovascular: Positive for leg swelling.       Feet swollen  Gastrointestinal: Negative.   Genitourinary: Negative.   Musculoskeletal: Negative.   Neurological:  Positive for headaches. Negative for dizziness, weakness and light-headedness.   I have reviewed patient's Past Medical Hx, Surgical Hx, Family Hx, Social Hx, medications and allergies.   Physical Exam   Patient Vitals for the past 24 hrs:  BP Temp Temp src Pulse Resp SpO2 Weight  04/19/18 1930 125/65 - - 89 - - -  04/19/18 1915 129/63 - - 95 - - -  04/19/18 1900 134/74 - - 93 - - -  04/19/18 1845 136/66 - - 97 - - -  04/19/18 1843 126/73 - - 96 - - -  04/19/18 1825 129/68 - - 99 - - -  04/19/18 1749 (!) 150/76 98.4 F (36.9 C) Oral 94 (!) 24 99 % 132.5 kg   Constitutional: Well-developed, morbid obese female in no acute distress.  Cardiovascular: normal rate Respiratory: normal effort GI: Abd soft, non-tender, gravid large for gestational age.  MS: Extremities nontender, +1 edema noted on bilateral ankles, normal ROM Neurologic: Alert and oriented x 4.  GU: Neg  CVAT. PELVIC EXAM: deferred   FHT:  Baseline 150 , moderate variability, accelerations present, no decelerations Contractions: none   Labs: Results for orders placed or performed during the hospital encounter of 04/19/18 (from the past 24 hour(s))  Protein / creatinine ratio, urine     Status: None   Collection Time: 04/19/18  5:57 PM  Result Value Ref Range   Creatinine, Urine 114.00 mg/dL   Total Protein, Urine 8 mg/dL   Protein Creatinine Ratio 0.07 0.00 - 0.15 mg/mg[Cre]  Urinalysis, Routine w reflex microscopic     Status: None   Collection Time: 04/19/18  5:57 PM  Result Value Ref Range   Color, Urine YELLOW YELLOW   APPearance CLEAR CLEAR   Specific Gravity, Urine 1.019 1.005 - 1.030   pH 6.0 5.0 - 8.0   Glucose, UA NEGATIVE NEGATIVE mg/dL   Hgb urine dipstick NEGATIVE NEGATIVE   Bilirubin Urine NEGATIVE NEGATIVE   Ketones, ur NEGATIVE NEGATIVE mg/dL   Protein, ur NEGATIVE NEGATIVE mg/dL   Nitrite NEGATIVE NEGATIVE   Leukocytes, UA NEGATIVE NEGATIVE  CBC     Status: Abnormal   Collection Time: 04/19/18  6:05 PM  Result Value Ref Range   WBC 11.4 (H) 4.0 - 10.5 K/uL   RBC 4.51 3.87 - 5.11 MIL/uL   Hemoglobin 11.8 (L) 12.0 - 15.0 g/dL   HCT 09.835.9 (L) 11.936.0 - 14.746.0 %   MCV 79.6 78.0 - 100.0 fL   MCH 26.2 26.0 - 34.0 pg   MCHC 32.9 30.0 - 36.0 g/dL   RDW 82.915.3 56.211.5 - 13.015.5 %   Platelets 168 150 - 400 K/uL  Comprehensive metabolic panel     Status: Abnormal   Collection Time: 04/19/18  6:05 PM  Result Value Ref Range   Sodium 135 135 - 145 mmol/L   Potassium 3.9 3.5 - 5.1 mmol/L   Chloride 108 98 - 111 mmol/L   CO2 19 (L) 22 - 32 mmol/L   Glucose, Bld 100 (H) 70 - 99 mg/dL   BUN 9 6 - 20 mg/dL   Creatinine, Ser 8.650.50 0.44 - 1.00 mg/dL   Calcium 8.9 8.9 - 78.410.3 mg/dL   Total Protein 6.3 (L) 6.5 - 8.1 g/dL   Albumin 3.1 (L) 3.5 - 5.0 g/dL   AST 15 15 - 41 U/L   ALT 14 0 - 44 U/L   Alkaline Phosphatase 150 (H) 38 - 126 U/L   Total Bilirubin 0.2 (L) 0.3 - 1.2  mg/dL    GFR calc non Af Amer >60 >60 mL/min   GFR calc Af Amer >60 >60 mL/min   Anion gap 8 5 - 15   --/--/O NEG Performed at [facility], [address] [place], [place] [address]  [phone] 1950)  MAU Course/MDM: Orders Placed This Encounter  Procedures  . CBC  . Comprehensive metabolic panel  . Protein / creatinine ratio, urine  . Urinalysis, Routine w reflex microscopic   PEC labs- negative  UA- negative   Meds ordered this encounter  Medications  . acetaminophen (TYLENOL) tablet 1,000 mg   NST reviewed- reactive  Treatments in MAU included 1000mg  Tylenol for HA- patient reports HA is relieved with medication.   Consult Dr Tenny Craw with presentation, exam findings and test results. Okay to discharge home with f/u and induction as scheduled.   Discussed results of lab work with patient. Educated on reasons to return to MAU. Pt discharge with strict PEC precautions. Assessment: 1. Maternal chronic hypertension in third trimester   2. [redacted] weeks gestation of pregnancy     Plan: Discharge home Labor precautions and fetal kick counts Follow up as scheduled for [place] tomorrow and induction next week  Return to MAU as needed for s/s of PEC and/or labor evaluation, DFM, LOF  Pt stable at time of discharge   Follow-up Information    [facility] Follow up.   Why:  Return to MAU as needed  Contact information: [address] [id number] mc [place] [address] [phone]          Allergies as of 04/19/2018   No Known Allergies     Medication List    TAKE these medications   calcium carbonate 500 MG chewable tablet Commonly known as:  TUMS - dosed in mg elemental calcium Chew 1 tablet by mouth daily as needed for indigestion or heartburn.   prenatal multivitamin Tabs tablet Take 1 tablet by mouth daily at 12 noon.       Steward Drone Certified Nurse-Midwife 04/19/2018 8:24 PM

## 2018-04-19 NOTE — MAU Note (Signed)
Noticed that her feet were kind of swollen, went to [employer] and checked her BP and it was 151/82. Minor HA, denies visual changes or epigastric pain.

## 2018-04-20 ENCOUNTER — Ambulatory Visit (HOSPITAL_COMMUNITY)
Admission: RE | Admit: 2018-04-20 | Discharge: 2018-04-20 | Disposition: A | Discharge status: Home / Self Care | Payer: 59 | Source: Ambulatory Visit | Attending: Obstetrics and Gynecology | Admitting: Obstetrics and Gynecology

## 2018-04-20 ENCOUNTER — Encounter (HOSPITAL_COMMUNITY): Payer: Self-pay

## 2018-04-20 DIAGNOSIS — O99213 Obesity complicating pregnancy, third trimester: Secondary | ICD-10-CM | POA: Insufficient documentation

## 2018-04-20 DIAGNOSIS — O10013 Pre-existing essential hypertension complicating pregnancy, third trimester: Secondary | ICD-10-CM | POA: Insufficient documentation

## 2018-04-20 DIAGNOSIS — D680 Von Willebrand's disease: Secondary | ICD-10-CM | POA: Insufficient documentation

## 2018-04-20 DIAGNOSIS — O99113 Other diseases of the blood and blood-forming organs and certain disorders involving the immune mechanism complicating pregnancy, third trimester: Secondary | ICD-10-CM | POA: Diagnosis not present

## 2018-04-20 DIAGNOSIS — O09293 Supervision of pregnancy with other poor reproductive or obstetric history, third trimester: Secondary | ICD-10-CM | POA: Insufficient documentation

## 2018-04-20 DIAGNOSIS — O10913 Unspecified pre-existing hypertension complicating pregnancy, third trimester: Secondary | ICD-10-CM

## 2018-04-20 DIAGNOSIS — Z3A38 38 weeks gestation of pregnancy: Secondary | ICD-10-CM | POA: Diagnosis not present

## 2018-04-24 ENCOUNTER — Other Ambulatory Visit: Payer: Self-pay

## 2018-04-24 ENCOUNTER — Inpatient Hospital Stay (HOSPITAL_COMMUNITY): Payer: 59 | Admitting: Anesthesiology

## 2018-04-24 ENCOUNTER — Encounter (HOSPITAL_COMMUNITY): Payer: Self-pay

## 2018-04-24 ENCOUNTER — Inpatient Hospital Stay (HOSPITAL_COMMUNITY)
Admission: RE | Admit: 2018-04-24 | Discharge: 2018-04-26 | DRG: 806 | Disposition: A | Discharge status: Home / Self Care | Payer: 59 | Attending: Obstetrics and Gynecology | Admitting: Obstetrics and Gynecology

## 2018-04-24 DIAGNOSIS — D680 Von Willebrand's disease: Secondary | ICD-10-CM | POA: Diagnosis present

## 2018-04-24 DIAGNOSIS — O9912 Other diseases of the blood and blood-forming organs and certain disorders involving the immune mechanism complicating childbirth: Secondary | ICD-10-CM | POA: Diagnosis present

## 2018-04-24 DIAGNOSIS — O139 Gestational [pregnancy-induced] hypertension without significant proteinuria, unspecified trimester: Secondary | ICD-10-CM | POA: Diagnosis present

## 2018-04-24 DIAGNOSIS — Z3A39 39 weeks gestation of pregnancy: Secondary | ICD-10-CM

## 2018-04-24 DIAGNOSIS — Z87891 Personal history of nicotine dependence: Secondary | ICD-10-CM

## 2018-04-24 DIAGNOSIS — O1002 Pre-existing essential hypertension complicating childbirth: Principal | ICD-10-CM | POA: Diagnosis present

## 2018-04-24 LAB — COMPREHENSIVE METABOLIC PANEL
ALT: 14 U/L (ref 0–44)
AST: 18 U/L (ref 15–41)
Albumin: 3.2 g/dL — ABNORMAL LOW (ref 3.5–5.0)
Alkaline Phosphatase: 141 U/L — ABNORMAL HIGH (ref 38–126)
Anion gap: 8 (ref 5–15)
BUN: 9 mg/dL (ref 6–20)
CO2: 20 mmol/L — ABNORMAL LOW (ref 22–32)
CREATININE: 0.44 mg/dL (ref 0.44–1.00)
Calcium: 8.5 mg/dL — ABNORMAL LOW (ref 8.9–10.3)
Chloride: 107 mmol/L (ref 98–111)
GFR calc Af Amer: >60 mL/min (ref >60)
Glucose, Bld: 79 mg/dL (ref 70–99)
POTASSIUM: 3.9 mmol/L (ref 3.5–5.1)
Sodium: 135 mmol/L (ref 135–145)
Total Bilirubin: 0.3 mg/dL (ref 0.3–1.2)
Total Protein: 6.3 g/dL — ABNORMAL LOW (ref 6.5–8.1)

## 2018-04-24 LAB — CBC
HCT: 34.9 % — ABNORMAL LOW (ref 36.0–46.0)
HEMATOCRIT: 35.2 % — AB (ref 36.0–46.0)
Hemoglobin: 11.6 g/dL — ABNORMAL LOW (ref 12.0–15.0)
Hemoglobin: 11.7 g/dL — ABNORMAL LOW (ref 12.0–15.0)
MCH: 26.1 pg (ref 26.0–34.0)
MCH: 26.1 pg (ref 26.0–34.0)
MCHC: 33.2 g/dL (ref 30.0–36.0)
MCHC: 33.2 g/dL (ref 30.0–36.0)
MCV: 78.6 fL (ref 78.0–100.0)
MCV: 78.6 fL (ref 78.0–100.0)
Platelets: 173 10*3/uL (ref 150–400)
Platelets: 162 10*3/uL (ref 150–400)
RBC: 4.44 MIL/uL (ref 3.87–5.11)
RBC: 4.48 MIL/uL (ref 3.87–5.11)
RDW: 15.6 % — AB (ref 11.5–15.5)
RDW: 15.2 % (ref 11.5–15.5)
WBC: 11.9 10*3/uL — AB (ref 4.0–10.5)
WBC: 10.6 10*3/uL — ABNORMAL HIGH (ref 4.0–10.5)

## 2018-04-24 LAB — TYPE AND SCREEN
ABO/RH(D): O NEG
Antibody Screen: NEGATIVE

## 2018-04-24 LAB — RPR: RPR: NONREACTIVE

## 2018-04-24 MED ORDER — PHENYLEPHRINE 40 MCG/ML (10ML) SYRINGE FOR IV PUSH (FOR BLOOD PRESSURE SUPPORT)
80.0000 ug | PREFILLED_SYRINGE | INTRAVENOUS | Status: DC | PRN
  Filled 2018-04-24: qty 5
  Filled 2018-04-24: qty 10

## 2018-04-24 MED ORDER — ACETAMINOPHEN 325 MG PO TABS
650.0000 mg | ORAL_TABLET | ORAL | Status: DC | PRN
  Administered 2018-04-24 (×2): 650 mg via ORAL
  Filled 2018-04-24 (×2): qty 2

## 2018-04-24 MED ORDER — OXYTOCIN BOLUS FROM INFUSION
500.0000 mL | Freq: Once | INTRAVENOUS | Status: AC
  Administered 2018-04-24: 500 mL via INTRAVENOUS

## 2018-04-24 MED ORDER — EPHEDRINE 5 MG/ML INJ
10.0000 mg | INTRAVENOUS | Status: DC | PRN
  Filled 2018-04-24: qty 2

## 2018-04-24 MED ORDER — SENNOSIDES-DOCUSATE SODIUM 8.6-50 MG PO TABS
2.0000 | ORAL_TABLET | ORAL | Status: DC
  Administered 2018-04-25: 2 via ORAL
  Filled 2018-04-24: qty 2

## 2018-04-24 MED ORDER — MISOPROSTOL 25 MCG QUARTER TABLET
25.0000 ug | ORAL_TABLET | ORAL | Status: DC | PRN
  Administered 2018-04-24 (×2): 25 ug via VAGINAL
  Filled 2018-04-24 (×3): qty 1

## 2018-04-24 MED ORDER — ONDANSETRON HCL 4 MG PO TABS: 4.0000 mg | ORAL_TABLET | ORAL | Status: DC | PRN

## 2018-04-24 MED ORDER — MAGNESIUM HYDROXIDE 400 MG/5ML PO SUSP: 30.0000 mL | ORAL | Status: DC | PRN

## 2018-04-24 MED ORDER — FERROUS SULFATE 325 (65 FE) MG PO TABS
325.0000 mg | ORAL_TABLET | Freq: Two times a day (BID) | ORAL | Status: DC
  Administered 2018-04-25 – 2018-04-26 (×3): 325 mg via ORAL
  Filled 2018-04-24 (×3): qty 1

## 2018-04-24 MED ORDER — SODIUM CHLORIDE 0.9 % IV SOLN: 250.0000 mL | INTRAVENOUS | Status: DC | PRN

## 2018-04-24 MED ORDER — ACETAMINOPHEN 325 MG PO TABS
650.0000 mg | ORAL_TABLET | ORAL | Status: DC | PRN
  Administered 2018-04-25 – 2018-04-26 (×4): 650 mg via ORAL
  Filled 2018-04-24 (×4): qty 2

## 2018-04-24 MED ORDER — LIDOCAINE HCL (PF) 1 % IJ SOLN
INTRAMUSCULAR | Status: DC | PRN
  Administered 2018-04-24 (×2): 5 mL via EPIDURAL

## 2018-04-24 MED ORDER — FENTANYL 2.5 MCG/ML BUPIVACAINE 1/10 % EPIDURAL INFUSION (WH - ANES)
14.0000 mL/h | INTRAMUSCULAR | Status: DC | PRN
  Administered 2018-04-24: 14 mL/h via EPIDURAL
  Filled 2018-04-24: qty 100

## 2018-04-24 MED ORDER — COCONUT OIL OIL
1.0000 "application " | TOPICAL_OIL | Status: DC | PRN
  Administered 2018-04-25: 1 via TOPICAL
  Filled 2018-04-24 (×2): qty 120

## 2018-04-24 MED ORDER — DIPHENHYDRAMINE HCL 50 MG/ML IJ SOLN: 12.5000 mg | INTRAMUSCULAR | Status: DC | PRN

## 2018-04-24 MED ORDER — BENZOCAINE-MENTHOL 20-0.5 % EX AERO
1.0000 "application " | INHALATION_SPRAY | CUTANEOUS | Status: DC | PRN
  Administered 2018-04-25: 1 via TOPICAL
  Filled 2018-04-24: qty 56

## 2018-04-24 MED ORDER — ONDANSETRON HCL 4 MG/2ML IJ SOLN
4.0000 mg | Freq: Four times a day (QID) | INTRAMUSCULAR | Status: DC | PRN
  Administered 2018-04-24 (×2): 4 mg via INTRAVENOUS
  Filled 2018-04-24 (×2): qty 2

## 2018-04-24 MED ORDER — ONDANSETRON HCL 4 MG/2ML IJ SOLN: 4.0000 mg | INTRAMUSCULAR | Status: DC | PRN

## 2018-04-24 MED ORDER — OXYTOCIN 40 UNITS IN LACTATED RINGERS INFUSION - SIMPLE MED
2.5000 [IU]/h | INTRAVENOUS | Status: DC
  Administered 2018-04-24: 2.5 [IU]/h via INTRAVENOUS
  Filled 2018-04-24: qty 1000

## 2018-04-24 MED ORDER — OXYCODONE-ACETAMINOPHEN 5-325 MG PO TABS: 2.0000 | ORAL_TABLET | ORAL | Status: DC | PRN

## 2018-04-24 MED ORDER — MEASLES, MUMPS & RUBELLA VAC ~~LOC~~ INJ
0.5000 mL | INJECTION | Freq: Once | SUBCUTANEOUS | Status: DC
  Filled 2018-04-24: qty 0.5

## 2018-04-24 MED ORDER — TERBUTALINE SULFATE 1 MG/ML IJ SOLN
0.2500 mg | Freq: Once | INTRAMUSCULAR | Status: DC | PRN
  Filled 2018-04-24: qty 1

## 2018-04-24 MED ORDER — DIBUCAINE 1 % RE OINT: 1.0000 "application " | TOPICAL_OINTMENT | RECTAL | Status: DC | PRN

## 2018-04-24 MED ORDER — METHYLERGONOVINE MALEATE 0.2 MG/ML IJ SOLN: 0.2000 mg | INTRAMUSCULAR | Status: DC | PRN

## 2018-04-24 MED ORDER — ZOLPIDEM TARTRATE 5 MG PO TABS: 5.0000 mg | ORAL_TABLET | Freq: Every evening | ORAL | Status: DC | PRN

## 2018-04-24 MED ORDER — PRENATAL MULTIVITAMIN CH
1.0000 | ORAL_TABLET | Freq: Every day | ORAL | Status: DC
  Administered 2018-04-25 – 2018-04-26 (×2): 1 via ORAL
  Filled 2018-04-24 (×2): qty 1

## 2018-04-24 MED ORDER — LIDOCAINE HCL (PF) 1 % IJ SOLN
30.0000 mL | INTRAMUSCULAR | Status: DC | PRN
  Filled 2018-04-24: qty 30

## 2018-04-24 MED ORDER — LACTATED RINGERS IV SOLN
INTRAVENOUS | Status: DC
  Administered 2018-04-24 (×3): via INTRAVENOUS

## 2018-04-24 MED ORDER — SOD CITRATE-CITRIC ACID 500-334 MG/5ML PO SOLN: 30.0000 mL | ORAL | Status: DC | PRN

## 2018-04-24 MED ORDER — OXYTOCIN 40 UNITS IN LACTATED RINGERS INFUSION - SIMPLE MED
1.0000 m[IU]/min | INTRAVENOUS | Status: DC
  Administered 2018-04-24: 2 m[IU]/min via INTRAVENOUS

## 2018-04-24 MED ORDER — SIMETHICONE 80 MG PO CHEW: 80.0000 mg | CHEWABLE_TABLET | ORAL | Status: DC | PRN

## 2018-04-24 MED ORDER — PHENYLEPHRINE 40 MCG/ML (10ML) SYRINGE FOR IV PUSH (FOR BLOOD PRESSURE SUPPORT)
80.0000 ug | PREFILLED_SYRINGE | INTRAVENOUS | Status: AC | PRN
  Administered 2018-04-24 (×3): 80 ug via INTRAVENOUS

## 2018-04-24 MED ORDER — IBUPROFEN 800 MG PO TABS
800.0000 mg | ORAL_TABLET | Freq: Three times a day (TID) | ORAL | Status: DC
  Administered 2018-04-24 – 2018-04-26 (×5): 800 mg via ORAL
  Filled 2018-04-24 (×5): qty 1

## 2018-04-24 MED ORDER — DIPHENHYDRAMINE HCL 25 MG PO CAPS: 25.0000 mg | ORAL_CAPSULE | Freq: Four times a day (QID) | ORAL | Status: DC | PRN

## 2018-04-24 MED ORDER — LACTATED RINGERS IV SOLN
500.0000 mL | Freq: Once | INTRAVENOUS | Status: AC
  Administered 2018-04-24: 500 mL via INTRAVENOUS

## 2018-04-24 MED ORDER — METHYLERGONOVINE MALEATE 0.2 MG PO TABS: 0.2000 mg | ORAL_TABLET | ORAL | Status: DC | PRN

## 2018-04-24 MED ORDER — TETANUS-DIPHTH-ACELL PERTUSSIS 5-2.5-18.5 LF-MCG/0.5 IM SUSP: 0.5000 mL | Freq: Once | INTRAMUSCULAR | Status: DC

## 2018-04-24 MED ORDER — SODIUM CHLORIDE 0.9% FLUSH: 3.0000 mL | INTRAVENOUS | Status: DC | PRN

## 2018-04-24 MED ORDER — WITCH HAZEL-GLYCERIN EX PADS: 1.0000 "application " | MEDICATED_PAD | CUTANEOUS | Status: DC | PRN

## 2018-04-24 MED ORDER — SODIUM CHLORIDE 0.9% FLUSH
3.0000 mL | Freq: Two times a day (BID) | INTRAVENOUS | Status: DC
  Administered 2018-04-25: 3 mL via INTRAVENOUS

## 2018-04-24 MED ORDER — LACTATED RINGERS IV SOLN: 500.0000 mL | INTRAVENOUS | Status: DC | PRN

## 2018-04-24 NOTE — Progress Notes (Signed)
Pt comfortable with epidural  FHTs 140s, GSTV, NST R Toco q 1-3 SVE 4/60/-2  IUPC placed to help with contraction monitoring.

## 2018-04-24 NOTE — Lactation Note (Signed)
This note was copied from a baby's chart. Lactation Consultation Note  Patient Name: Carly Dione PloverCorina Koran JJOAC'ZToday's Date: 04/24/2018 Reason for consult: Initial assessment Mom is very tired and sleepy.  Per mom she feed infant at 10:30 pm  for 10 minutes. Mom will call LC when ready for a  Consult.  Maternal Data    Feeding Feeding Type: Breast Fed Length of feed: 15 min  LATCH Score Latch: Grasps breast easily, tongue down, lips flanged, rhythmical sucking.  Audible Swallowing: None  Type of Nipple: Everted at rest and after stimulation  Comfort (Breast/Nipple): Soft / non-tender  Hold (Positioning): No assistance needed to correctly position infant at breast.  LATCH Score: 8  Interventions Interventions: Skin to skin;Support pillows  Lactation Tools Discussed/Used     Consult Status      Danelle EarthlyRobin Michel Hendon 04/24/2018, 10:52 PM

## 2018-04-24 NOTE — Anesthesia Pain Management Evaluation Note (Signed)
  CRNA Pain Management Visit Note  Patient: Carly Rush, 27 y.o., female  "Hello I am a member of the anesthesia team at [facility]. We have an anesthesia team available at all times to provide care throughout the hospital, including epidural management and anesthesia for C-section. I don't know your plan for the delivery whether it a natural birth, water birth, IV sedation, nitrous supplementation, doula or epidural, but we want to meet your pain goals."   1.Was your pain managed to your expectations on prior hospitalizations?   Yes   2.What is your expectation for pain management during this hospitalization?     Epidural  3.How can we help you reach that goal? Epidural when ready  Record the patient's initial score and the patient's pain goal.   Pain: 4  Pain Goal: 8 [facility] wants you to be able to say your pain was always managed very well.  Edison PaceWILKERSON,Alonnah Lampkins 04/24/2018

## 2018-04-24 NOTE — Anesthesia Preprocedure Evaluation (Signed)
Anesthesia Evaluation  Patient identified by MRN, date of birth, ID band Patient awake    Reviewed: Allergy & Precautions, H&P , NPO status , Patient's Chart, lab work & pertinent test results, reviewed documented beta blocker date and time   Airway Mallampati: III  TM Distance: >3 FB Neck ROM: full    Dental no notable dental hx.    Pulmonary neg pulmonary ROS, asthma , former smoker,    Pulmonary exam normal breath sounds clear to auscultation       Cardiovascular hypertension, negative cardio ROS Normal cardiovascular exam Rhythm:regular Rate:Normal     Neuro/Psych negative neurological ROS  negative psych ROS   GI/Hepatic negative GI ROS, Neg liver ROS, GERD  ,  Endo/Other  negative endocrine ROS  Renal/GU negative Renal ROS  negative genitourinary   Musculoskeletal   Abdominal   Peds  Hematology negative hematology ROS (+)   Anesthesia Other Findings Von willibrand  Reproductive/Obstetrics (+) Pregnancy                             Anesthesia Physical  Anesthesia Plan  ASA: II  Anesthesia Plan: Epidural   Post-op Pain Management:    Induction:   PONV Risk Score and Plan:   Airway Management Planned:   Additional Equipment:   Intra-op Plan:   Post-operative Plan:   Informed Consent: I have reviewed the patients History and Physical, chart, labs and discussed the procedure including the risks, benefits and alternatives for the proposed anesthesia with the patient or authorized representative who has indicated his/her understanding and acceptance.     Plan Discussed with:   Anesthesia Plan Comments:         Anesthesia Quick Evaluation

## 2018-04-24 NOTE — H&P (Addendum)
27 y.o. 945w1d  G4P2011 comes in for induction of labor at term for low AFI and hx of CHTN without meds in pregnancy.  Pt has had no history of bleeding with Von Willebrands and MFM rec no additional treatment.  Otherwise has good fetal movement and no bleeding.  Past Medical History:  Diagnosis Date  . Asthma    childhood  . Chlamydia 2011  . Gastric reflux   . GERD (gastroesophageal reflux disease)   . Gonorrhea 2011  . Headache(784.0)   . Hypertension   . Pregnancy induced hypertension   . Von Willebrand disease (HCC)     Past Surgical History:  Procedure Laterality Date  . DILATION AND CURETTAGE OF UTERUS    . INDUCED ABORTION    . TONSILLECTOMY    . WISDOM TOOTH EXTRACTION      OB History  Gravida Para Term Preterm AB Living  4 2 2  0 1 1  SAB TAB Ectopic Multiple Live Births  0 1 0 0 2    # Outcome Date GA Lbr Len/2nd Weight Sex Delivery Anes PTL Lv  4 Current           3 Term 2018    M Vag-Spont   LIV  2 Term 05/29/13 5133w3d / 01:14 3150 g M Vag-Spont EPI  DEC     Birth Comments: baby died @ age 38 mos. of unknown cause  1 TAB             Social History   Socioeconomic History  . Marital status: Single    Spouse name: Not on file  . Number of children: Not on file  . Years of education: Not on file  . Highest education level: Not on file  Occupational History  . Not on file  Social Needs  . Financial resource strain: Not hard at all  . Food insecurity:    Worry: Never true    Inability: Never true  . Transportation needs:    Medical: No    Non-medical: No  Tobacco Use  . Smoking status: Former Smoker    Years: 5.00    Types: Cigarettes    Last attempt to quit: 08/31/2014    Years since quitting: 3.6  . Smokeless tobacco: Never Used  Substance and Sexual Activity  . Alcohol use: Not Currently    Comment: 3-4 drinks occasionally  . Drug use: No  . Sexual activity: Yes    Birth control/protection: None  Lifestyle  . Physical activity:    Days per  week: 0 days    Minutes per session: Not on file  . Stress: Not at all  Relationships  . Social connections:    Talks on phone: Patient refused    Gets together: Patient refused    Attends religious service: Patient refused    Active member of club or organization: Patient refused    Attends meetings of clubs or organizations: Patient refused    Relationship status: Patient refused  . Intimate partner violence:    Fear of current or ex partner: No    Emotionally abused: No    Physically abused: No    Forced sexual activity: No  Other Topics Concern  . Not on file  Social History Narrative  . Not on file   Patient has no known allergies.    Prenatal Transfer Tool  Maternal Diabetes: No Genetic Screening: Declined Maternal Ultrasounds/Referrals: Normal but low fluid Fetal Ultrasounds or other Referrals:  Referred to Materal Fetal  Medicine - low AFI but not oligo. EFW last was 59%.  AFI ayt 38 weeks 6. ANT was reassuring.  Maternal Substance Abuse:  No Significant Maternal Medications:  Meds include: Other: valcyclovir Significant Maternal Lab Results: None  Other PNC: uncomplicated.    Vitals:   04/24/18 0405 04/24/18 0530 04/24/18 0700 04/24/18 0730  BP: (!) 100/42 (!) 118/53 (!) 124/51 121/73  Pulse: 99 97 88 90  Resp: 17 17 18 16   Temp:  98.8 F (37.1 C)    TempSrc:  Oral    SpO2:      Weight:      Height:        Lungs/Cor:  NAD Abdomen:  soft, gravid Ex:  no cords, erythema SVE:  1/thick/high SSE:neg for lesions FHTs:  140, good STV, NST R Toco:  q 4-5   A/P   Term with low AFI, CHTN not on meds.  Induction at term.   GBS neg.  Pitocin and AROM when last cytotec up.   Carly Rush A

## 2018-04-24 NOTE — Anesthesia Procedure Notes (Signed)
Epidural Patient location during procedure: OB  Staffing Anesthesiologist: Keyla Milone, MD Performed: anesthesiologist   Preanesthetic Checklist Completed: patient identified, site marked, surgical consent, pre-op evaluation, timeout performed, IV checked, risks and benefits discussed and monitors and equipment checked  Epidural Patient position: sitting Prep: DuraPrep Patient monitoring: heart rate, continuous pulse ox and blood pressure Approach: right paramedian Location: L3-L4 Injection technique: LOR saline  Needle:  Needle type: Tuohy  Needle gauge: 17 G Needle length: 9 cm and 9 Needle insertion depth: 7 cm Catheter type: closed end flexible Catheter size: 20 Guage Catheter at skin depth: 12 cm Test dose: negative  Assessment Events: blood not aspirated, injection not painful, no injection resistance, negative IV test and no paresthesia  Additional Notes Patient identified. Risks/Benefits/Options discussed with patient including but not limited to bleeding, infection, nerve damage, paralysis, failed block, incomplete pain control, headache, blood pressure changes, nausea, vomiting, reactions to medication both or allergic, itching and postpartum back pain. Confirmed with bedside nurse the patient's most recent platelet count. Confirmed with patient that they are not currently taking any anticoagulation, have any bleeding history or any family history of bleeding disorders. Patient expressed understanding and wished to proceed. All questions were answered. Sterile technique was used throughout the entire procedure. Please see nursing notes for vital signs. Test dose was given through epidural needle and negative prior to continuing to dose epidural or start infusion. Warning signs of high block given to the patient including shortness of breath, tingling/numbness in hands, complete motor block, or any concerning symptoms with instructions to call for help. Patient was given  instructions on fall risk and not to get out of bed. All questions and concerns addressed with instructions to call with any issues.     

## 2018-04-25 LAB — CBC
HCT: 33.5 % — ABNORMAL LOW (ref 36.0–46.0)
HEMOGLOBIN: 11.1 g/dL — AB (ref 12.0–15.0)
MCH: 26.1 pg (ref 26.0–34.0)
MCHC: 33.1 g/dL (ref 30.0–36.0)
MCV: 78.6 fL (ref 78.0–100.0)
PLATELETS: 177 10*3/uL (ref 150–400)
RBC: 4.26 MIL/uL (ref 3.87–5.11)
RDW: 15.3 % (ref 11.5–15.5)
WBC: 14.2 10*3/uL — ABNORMAL HIGH (ref 4.0–10.5)

## 2018-04-25 MED ORDER — RHO D IMMUNE GLOBULIN 1500 UNIT/2ML IJ SOSY
300.0000 ug | PREFILLED_SYRINGE | Freq: Once | INTRAMUSCULAR | Status: AC
  Administered 2018-04-25: 300 ug via INTRAVENOUS
  Filled 2018-04-25: qty 2

## 2018-04-25 NOTE — Anesthesia Postprocedure Evaluation (Signed)
Anesthesia Post Note  Patient: Psychologist, sport and exerciseCorina Rush  Procedure(s) Performed: AN AD HOC LABOR EPIDURAL     Patient location during evaluation: Mother Baby Anesthesia Type: Epidural Level of consciousness: awake and alert Pain management: pain level controlled Vital Signs Assessment: post-procedure vital signs reviewed and stable Respiratory status: spontaneous breathing, nonlabored ventilation and respiratory function stable Cardiovascular status: stable Postop Assessment: no headache, no backache and epidural receding Anesthetic complications: no    Last Vitals:  Vitals:   04/25/18 0245 04/25/18 0715  BP: (!) 121/56 (!) 112/59  Pulse: 74 68  Resp: 16   Temp: 36.7 C 36.6 C  SpO2: 99% 98%    Last Pain:  Vitals:   04/25/18 0715  TempSrc: Oral  PainSc:    Pain Goal: Patients Stated Pain Goal: 8 (04/24/18 0731)               Junious SilkGILBERT,Carly Rush

## 2018-04-25 NOTE — Progress Notes (Signed)
CSW received consult due to loss of 8 month old due to unknown cause.  CSW notes that MOB had CSW assessment after her delivery in 2018 which addressed this concern.  Therefore, CSW does not feel this needs to be addressed again at this time and has notified provider.  CSW is screening out referral.  Please contact CSW if current concerns arise or by MOB's request. 

## 2018-04-25 NOTE — Lactation Note (Signed)
This note was copied from a baby's chart. Lactation Consultation Note  Patient Name: Girl Dione PloverCorina Cotugno [id number]'s Date: 04/25/2018  P2, 8 hr female infant. LC returned to room to follow up w/ BF and assist mom for BF. Infant was having hearing screening done as LC entered room. Per mom, she is tired and will give infant formula at this time , and baby is going to nursery so she can rest. LC informed mom. LC services is available to help or assist her with BF  when she is ready.     Maternal Data    Feeding    LATCH Score                   Interventions    Lactation Tools Discussed/Used     Consult Status      Danelle EarthlyRobin Brysin Towery 04/25/2018, 3:06 AM

## 2018-04-25 NOTE — Progress Notes (Signed)
Patient sleeping.  Family reports she did not get much sleep overnight, but otherwise doing well and requests that I not wake her at this time

## 2018-04-26 LAB — RH IG WORKUP (INCLUDES ABO/RH)
ABO/RH(D): O NEG
Fetal Screen: NEGATIVE
Gestational Age(Wks): 39
UNIT DIVISION: 0

## 2018-04-26 NOTE — Progress Notes (Signed)
Patient is eating, ambulating, voiding.  Pain control is good.  Vitals:   04/25/18 0245 04/25/18 0715 04/25/18 1045 04/25/18 2158  BP: (!) 121/56 (!) 112/59 123/74 127/72  Pulse: 74 68 66 81  Resp: 16  18   Temp: 98 F (36.7 C) 97.8 F (36.6 C) 98.4 F (36.9 C) 97.9 F (36.6 C)  TempSrc: Oral Oral Oral Oral  SpO2: 99% 98% 99% 99%  Weight:      Height:        Fundus firm Perineum without swelling.  Lab Results  Component Value Date   WBC 14.2 (H) 04/25/2018   HGB 11.1 (L) 04/25/2018   HCT 33.5 (L) 04/25/2018   MCV 78.6 04/25/2018   PLT 177 04/25/2018    --/--/O NEG (08/28 0539)/RI  A/P Post partum day 2.  Baby is O ps- Rhogam given.  Routine care.  Expect d/c today.    Kripa Foskey A

## 2018-04-26 NOTE — Lactation Note (Signed)
This note was copied from a baby's chart. Lactation Consultation Note: Experienced BF mom reports nipples are very sore and she was cramping a lot so she has supplemented some with formula to give her nipples a rest, Both nipples raw in center of tips of nipples. Reviewed importance of getting a deep latch. Reports coconut oil is not helping much. Wants to use Lanolin. Reviewed pros/cons of coconut oil vs lanolin. Encouraged hand expression to rub EBM into nipples after nursing or pumping. Mom has DEBP for home. Encouraged to pump if she is not putting the baby to the breast to promote milk supply. Breasts still feel soft. No questions at present. Reviewed our phone number, OP appointments and BFSG as resources for support after DC. To call prn  Patient Name: Carly Rush ZOXWR'UToday's Date: 04/26/2018 Reason for consult: Follow-up assessment   Maternal Data Formula Feeding for Exclusion: No Has patient been taught Hand Expression?: Yes Does the patient have breastfeeding experience prior to this delivery?: Yes  Feeding Feeding Type: Bottle Fed - Formula  LATCH Score       Type of Nipple: Everted at rest and after stimulation  Comfort (Breast/Nipple): Filling, red/small blisters or bruises, mild/mod discomfort        Interventions Interventions: Expressed milk;Coconut oil;Hand express  Lactation Tools Discussed/Used     Consult Status Consult Status: Complete    Pamelia HoitWeeks, Nicky Kras D 04/26/2018, 9:38 AM

## 2018-04-26 NOTE — Discharge Summary (Signed)
Obstetric Discharge Summary Reason for Admission: induction of labor Prenatal Procedures: NST Intrapartum Procedures: spontaneous vaginal delivery Postpartum Procedures: Rho(D) Ig Complications-Operative and Postpartum: none Hemoglobin  Date Value Ref Range Status  04/25/2018 11.1 (L) 12.0 - 15.0 g/dL Final   HCT  Date Value Ref Range Status  04/25/2018 33.5 (L) 36.0 - 46.0 % Final     Discharge Diagnoses: Term Pregnancy-delivered  Discharge Information: Date: 04/26/2018 Activity: pelvic rest Diet: routine Medications: Ibuprofen Condition: stable Instructions: refer to practice specific booklet Discharge to: home Follow-up Information    Carrington ClampHorvath, Elita Dame, MD Follow up in 4 week(s).   Specialty:  Obstetrics and Gynecology Contact information: [address] [place] [id number] [phone]           Newborn Data: Live born female  Birth Weight: 6 lb 13.5 oz (3105 g) APGAR: 8, 9  Newborn Delivery   Birth date/time:  04/24/2018 18:59:00 Delivery type:  Vaginal, Spontaneous     Home with mother.  Ferron Ishmael A 04/26/2018, 7:40 AM

## 2018-08-20 ENCOUNTER — Emergency Department (HOSPITAL_COMMUNITY)
Admission: EM | Admit: 2018-08-20 | Discharge: 2018-08-20 | Disposition: A | Discharge status: Home / Self Care | Payer: 59 | Attending: Emergency Medicine | Admitting: Emergency Medicine

## 2018-08-20 ENCOUNTER — Other Ambulatory Visit: Payer: Self-pay

## 2018-08-20 ENCOUNTER — Encounter (HOSPITAL_COMMUNITY): Payer: Self-pay | Admitting: Emergency Medicine

## 2018-08-20 ENCOUNTER — Emergency Department (HOSPITAL_COMMUNITY): Payer: 59

## 2018-08-20 DIAGNOSIS — J45909 Unspecified asthma, uncomplicated: Secondary | ICD-10-CM | POA: Diagnosis not present

## 2018-08-20 DIAGNOSIS — I10 Essential (primary) hypertension: Secondary | ICD-10-CM | POA: Diagnosis not present

## 2018-08-20 DIAGNOSIS — Z87891 Personal history of nicotine dependence: Secondary | ICD-10-CM | POA: Diagnosis not present

## 2018-08-20 DIAGNOSIS — R1011 Right upper quadrant pain: Secondary | ICD-10-CM | POA: Diagnosis present

## 2018-08-20 DIAGNOSIS — K8070 Calculus of gallbladder and bile duct without cholecystitis without obstruction: Secondary | ICD-10-CM

## 2018-08-20 LAB — COMPREHENSIVE METABOLIC PANEL
ALK PHOS: 74 U/L (ref 38–126)
ALT: 617 U/L — ABNORMAL HIGH (ref 0–44)
ANION GAP: 12 (ref 5–15)
AST: 336 U/L — ABNORMAL HIGH (ref 15–41)
Albumin: 4.5 g/dL (ref 3.5–5.0)
BILIRUBIN TOTAL: 5.2 mg/dL — AB (ref 0.3–1.2)
BUN: 9 mg/dL (ref 6–20)
CALCIUM: 9.5 mg/dL (ref 8.9–10.3)
CO2: 24 mmol/L (ref 22–32)
Chloride: 102 mmol/L (ref 98–111)
Creatinine, Ser: 0.43 mg/dL — ABNORMAL LOW (ref 0.44–1.00)
GFR calc non Af Amer: >60 mL/min (ref >60)
Glucose, Bld: 118 mg/dL — ABNORMAL HIGH (ref 70–99)
POTASSIUM: 3.6 mmol/L (ref 3.5–5.1)
Sodium: 138 mmol/L (ref 135–145)
TOTAL PROTEIN: 7.5 g/dL (ref 6.5–8.1)

## 2018-08-20 LAB — URINALYSIS, ROUTINE W REFLEX MICROSCOPIC
Glucose, UA: NEGATIVE mg/dL
Ketones, ur: NEGATIVE mg/dL
LEUKOCYTES UA: NEGATIVE
NITRITE: NEGATIVE
PH: 6 (ref 5.0–8.0)
Protein, ur: 30 mg/dL — AB
Specific Gravity, Urine: 1.039 — ABNORMAL HIGH (ref 1.005–1.030)

## 2018-08-20 LAB — CBC WITH DIFFERENTIAL/PLATELET
ABS IMMATURE GRANULOCYTES: 0.03 10*3/uL (ref 0.00–0.07)
BASOS PCT: 0 %
Basophils Absolute: 0 10*3/uL (ref 0.0–0.1)
Eosinophils Absolute: 0.1 10*3/uL (ref 0.0–0.5)
Eosinophils Relative: 1 %
HEMATOCRIT: 42.8 % (ref 36.0–46.0)
HEMOGLOBIN: 14 g/dL (ref 12.0–15.0)
IMMATURE GRANULOCYTES: 0 %
LYMPHS ABS: 2.2 10*3/uL (ref 0.7–4.0)
LYMPHS PCT: 31 %
MCH: 26.9 pg (ref 26.0–34.0)
MCHC: 32.7 g/dL (ref 30.0–36.0)
MCV: 82.3 fL (ref 80.0–100.0)
MONOS PCT: 6 %
Monocytes Absolute: 0.4 10*3/uL (ref 0.1–1.0)
NEUTROS ABS: 4.4 10*3/uL (ref 1.7–7.7)
NEUTROS PCT: 62 %
PLATELETS: 206 10*3/uL (ref 150–400)
RBC: 5.2 MIL/uL — ABNORMAL HIGH (ref 3.87–5.11)
RDW: 13.6 % (ref 11.5–15.5)
WBC: 7.1 10*3/uL (ref 4.0–10.5)
nRBC: 0 % (ref 0.0–0.2)

## 2018-08-20 LAB — I-STAT BETA HCG BLOOD, ED (MC, WL, AP ONLY): I-stat hCG, quantitative: <5 m[IU]/mL (ref <5)

## 2018-08-20 LAB — LIPASE, BLOOD: Lipase: 32 U/L (ref 11–51)

## 2018-08-20 MED ORDER — SODIUM CHLORIDE 0.9 % IV BOLUS
500.0000 mL | Freq: Once | INTRAVENOUS | Status: AC
  Administered 2018-08-20: 500 mL via INTRAVENOUS

## 2018-08-20 MED ORDER — ONDANSETRON 8 MG PO TBDP: ORAL_TABLET | ORAL | 0 refills | Status: AC

## 2018-08-20 MED ORDER — ALUM & MAG HYDROXIDE-SIMETH 200-200-20 MG/5ML PO SUSP
30.0000 mL | Freq: Once | ORAL | Status: AC
  Administered 2018-08-20: 30 mL via ORAL
  Filled 2018-08-20: qty 30

## 2018-08-20 MED ORDER — LIDOCAINE VISCOUS HCL 2 % MT SOLN
15.0000 mL | Freq: Once | OROMUCOSAL | Status: AC
  Administered 2018-08-20: 15 mL via ORAL
  Filled 2018-08-20: qty 15

## 2018-08-20 MED ORDER — OXYCODONE HCL 5 MG PO TABS: 5.0000 mg | ORAL_TABLET | Freq: Four times a day (QID) | ORAL | 0 refills | Status: DC | PRN

## 2018-08-20 MED ORDER — ONDANSETRON 8 MG PO TBDP
8.0000 mg | ORAL_TABLET | Freq: Once | ORAL | Status: AC
  Administered 2018-08-20: 8 mg via ORAL
  Filled 2018-08-20: qty 1

## 2018-08-20 MED ORDER — FENTANYL CITRATE (PF) 100 MCG/2ML IJ SOLN
50.0000 ug | Freq: Once | INTRAMUSCULAR | Status: AC
  Administered 2018-08-20: 50 ug via INTRAVENOUS
  Filled 2018-08-20: qty 2

## 2018-08-20 MED ORDER — ACETAMINOPHEN 500 MG PO TABS: 1000.0000 mg | ORAL_TABLET | Freq: Once | ORAL | Status: DC

## 2018-08-20 NOTE — ED Provider Notes (Signed)
[facility]-EMERGENCY DEPT Provider Note   CSN: [id number] Arrival date & time: 08/20/18  0131     History   Chief Complaint Chief Complaint  Patient presents with  . Abdominal Pain    HPI Carly Rush is a 27 y.o. female with a hx of asthma, acid reflux, GERD, HTN presents to the Emergency Department complaining of gradual, waxing and waning, progressively worsening epigastric and right upper quadrant abdominal pain and pressure onset 2 days ago.  Patient reports pain is a 6/10 and dull in nature.  She reports associated feeling of bloating and indigestion.  Pt reports trying alka seltzer, Tums, Nexium and Pepto Bismol without relief.  Patient reports that food worsens her pain significantly.  Nothing seems to make it better.  She has had nausea without vomiting.  No previous abdominal surgeries.  Patient is 4 months postpartum.  She reports a family history of cholecystitis with subsequent cholecystectomy.  She denies headache, neck pain, chest pain, shortness of breath, vomiting, diarrhea, weakness, dizziness, syncope.   The history is provided by the patient and medical records. No language interpreter was used.    Past Medical History:  Diagnosis Date  . Asthma    childhood  . Chlamydia 2011  . Gastric reflux   . GERD (gastroesophageal reflux disease)   . Gonorrhea 2011  . Headache(784.0)   . Hypertension   . Pregnancy induced hypertension   . Von Willebrand disease [facility])     Patient Active Problem List   Diagnosis Date Noted  . Pregnancy induced hypertension 04/24/2018  . Encounter for fetal anatomic survey   . [redacted] weeks gestation of pregnancy   . Obesity affecting pregnancy in second trimester   . [redacted] weeks gestation of pregnancy   . Abdominal pain affecting pregnancy   . Morbid obesity with BMI of 40.0-44.9, adult (HCC) 11/27/2017  . Chronic hypertension 11/22/2017  . Postpartum state 11/20/2016  . Gestational hypertension 11/19/2016  . Von  Willebrand disease (HCC) 11/02/2011  . Menometrorrhagia 07/26/2011    Past Surgical History:  Procedure Laterality Date  . DILATION AND CURETTAGE OF UTERUS    . INDUCED ABORTION    . TONSILLECTOMY    . WISDOM TOOTH EXTRACTION       OB History    Gravida  4   Para  3   Term  3   Preterm  0   AB  1   Living  2     SAB  0   TAB  1   Ectopic  0   Multiple  0   Live Births  3            Home Medications    Prior to Admission medications   Medication Sig Start Date End Date Taking? Authorizing Provider  acetaminophen (TYLENOL) 500 MG tablet Take 1,000 mg by mouth every 6 (six) hours as needed for mild pain, moderate pain or headache.   Yes [provider]  bismuth subsalicylate (PEPTO BISMOL) 262 MG/15ML suspension Take 30 mLs by mouth every 6 (six) hours as needed for indigestion or diarrhea or loose stools.   Yes [provider]  calcium carbonate (TUMS - DOSED IN MG ELEMENTAL CALCIUM) 500 MG chewable tablet Chew 1 tablet by mouth daily as needed for indigestion or heartburn.   Yes [provider]  ondansetron (ZOFRAN ODT) 8 MG disintegrating tablet 8mg  ODT q4 hours prn nausea 08/20/18   Demaurion Dicioccio, Dahlia Client, PA-C  oxyCODONE (ROXICODONE) 5 MG immediate  release tablet Take 1 tablet (5 mg total) by mouth every 6 (six) hours as needed for severe pain. 08/20/18   Elke Holtry, Dahlia Client, PA-C    Family History Family History  Problem Relation Age of Onset  . Cancer Mother        cervical  . Hypertension Father   . Arthritis Father   . Drug abuse Father   . Obesity Father   . Diabetes Brother   . ADD / ADHD Brother   . Depression Brother   . Obesity Brother   . Early death Son   . Miscarriages / Stillbirths Maternal Aunt   . Anxiety disorder Maternal Grandmother   . Cancer Maternal Grandmother   . Anesthesia problems Neg Hx     Social History Social History   Tobacco Use  . Smoking status: Former Smoker    Years: 5.00     Types: Cigarettes    Last attempt to quit: 08/31/2014    Years since quitting: 3.9  . Smokeless tobacco: Never Used  Substance Use Topics  . Alcohol use: Not Currently    Comment: 3-4 drinks occasionally  . Drug use: No     Allergies   Patient has no known allergies.   Review of Systems Review of Systems  Constitutional: Negative for appetite change, diaphoresis, fatigue, fever and unexpected weight change.  HENT: Negative for mouth sores.   Eyes: Negative for visual disturbance.  Respiratory: Negative for cough, chest tightness, shortness of breath and wheezing.   Cardiovascular: Negative for chest pain.  Gastrointestinal: Positive for abdominal pain and nausea. Negative for constipation, diarrhea and vomiting.  Endocrine: Negative for polydipsia, polyphagia and polyuria.  Genitourinary: Negative for dysuria, frequency, hematuria and urgency.  Musculoskeletal: Negative for back pain and neck stiffness.  Skin: Negative for rash.  Allergic/Immunologic: Negative for immunocompromised state.  Neurological: Negative for syncope, light-headedness and headaches.  Hematological: Does not bruise/bleed easily.  Psychiatric/Behavioral: Negative for sleep disturbance. The patient is not nervous/anxious.      Physical Exam Updated Vital Signs BP (!) 165/103   Pulse 77   Temp 97.9 F (36.6 C) (Oral)   Resp 20   Ht 5\' 5"  (1.651 m)   Wt 126.6 kg   LMP 08/20/2018   SpO2 100%   BMI 46.43 kg/m   Physical Exam Vitals signs and nursing note reviewed.  Constitutional:      General: She is not in acute distress.    Appearance: She is well-developed. She is not diaphoretic.     Comments: Awake, alert, nontoxic appearance  HENT:     Head: Normocephalic and atraumatic.     Mouth/Throat:     Pharynx: No oropharyngeal exudate.  Eyes:     General: No scleral icterus.    Conjunctiva/sclera: Conjunctivae normal.  Neck:     Musculoskeletal: Normal range of motion and neck supple.    Cardiovascular:     Rate and Rhythm: Normal rate and regular rhythm.  Pulmonary:     Effort: Pulmonary effort is normal. No respiratory distress.     Breath sounds: Normal breath sounds. No wheezing.  Abdominal:     General: Bowel sounds are normal.     Palpations: Abdomen is soft. There is no mass.     Tenderness: There is abdominal tenderness in the right upper quadrant and epigastric area. There is guarding. There is no right CVA tenderness, left CVA tenderness or rebound. Positive signs include Murphy's sign. Negative signs include McBurney's sign.  Musculoskeletal: Normal range of  motion.  Skin:    General: Skin is warm and dry.  Neurological:     Mental Status: She is alert.     Comments: Speech is clear and goal oriented Moves extremities without ataxia      ED Treatments / Results  Labs (all labs ordered are listed, but only abnormal results are displayed) Labs Reviewed  COMPREHENSIVE METABOLIC PANEL - Abnormal; Notable for the following components:      Result Value   Glucose, Bld 118 (*)    Creatinine, Ser 0.43 (*)    AST 336 (*)    ALT 617 (*)    Total Bilirubin 5.2 (*)    All other components within normal limits  URINALYSIS, ROUTINE W REFLEX MICROSCOPIC - Abnormal; Notable for the following components:   Color, Urine AMBER (*)    Specific Gravity, Urine 1.039 (*)    Hgb urine dipstick LARGE (*)    Bilirubin Urine MODERATE (*)    Protein, ur 30 (*)    Bacteria, UA FEW (*)    All other components within normal limits  CBC WITH DIFFERENTIAL/PLATELET - Abnormal; Notable for the following components:   RBC 5.20 (*)    All other components within normal limits  LIPASE, BLOOD  I-STAT BETA HCG BLOOD, ED ([facility], [facility], [facility] ONLY)     Radiology [place] Abdomen Limited  Result Date: 08/20/2018 CLINICAL DATA:  RIGHT upper quadrant pain. EXAM: ULTRASOUND ABDOMEN LIMITED RIGHT UPPER QUADRANT COMPARISON:  None. FINDINGS: Gallbladder: Multiple echogenic gallstones measuring to 6  mm with acoustic shadowing. Gallbladder wall is top-normal at 2-3 mm. No pericholecystic fluid. No sonographic Murphy sign elicited. Common bile duct: Diameter: 7 mm. Liver: No focal lesion identified. Increased parenchymal echogenicity. Portal vein is patent on color Doppler imaging with normal direction of blood flow towards the liver. IMPRESSION: 1. Cholelithiasis without sonographic findings of acute cholecystitis. 2. Mildly dilated common bile duct can be seen with recently passed stones. No sonographically identified choledocholithiasis. 3. Hepatic steatosis/hepatocellular disease. Electronically Signed   By: Awilda Metroourtnay  Bloomer M.D.   On: 08/20/2018 05:40    Procedures Procedures (including critical care time)  Medications Ordered in ED Medications  alum & mag hydroxide-simeth (MAALOX/MYLANTA) 200-200-20 MG/5ML suspension 30 mL (30 mLs Oral Given 08/20/18 0253)    And  lidocaine (XYLOCAINE) 2 % viscous mouth solution 15 mL (15 mLs Oral Given 08/20/18 0253)  fentaNYL (SUBLIMAZE) injection 50 mcg (50 mcg Intravenous Given 08/20/18 0426)  sodium chloride 0.9 % bolus 500 mL (0 mLs Intravenous Stopped 08/20/18 0531)     Initial Impression / Assessment and Plan / ED Course  I have reviewed the triage vital signs and the nursing notes.  Pertinent labs & imaging results that were available during my care of the patient were reviewed by me and considered in my medical decision making (see chart for details).  Clinical Course as of Aug 20 626  Mon Aug 20, 2018  [id number] Pt reports taking 1 tylenol today and 1x yesterday.  She also took Excedrine migraine. She report she does not regularly take tylenol.  She denies drinking alcohol.  She is not breastfeeding, but is 4 mos postpartum.    AST(!): 336 [HM]    Clinical Course User Index [HM] Raad Clayson, Dahlia ClientHannah, PA-C    Patient presents with right upper quadrant and epigastric abdominal pain.  CBC without leukocytosis however CMP does show  elevation in AST and ALT.  Patient has had no history of this before.  She is not taking  large amounts of Tylenol and is not drinking alcohol.  Suspect cholecystitis versus choledocholithiasis.  Patient is very tender on exam.  She is afebrile without hypotension.  6:28 AM Ultrasound shows slightly dilated common bile duct but no retained stone.  Patient's pain is completely resolved.  She is not vomiting and has tolerated p.o. here in the department.  She is afebrile.  Patient was given a copy of her labs and they were reviewed with her.  Discussed elevation in AST, ALT and total bili along with ultrasound findings of Colelithiasis but no evidence of cholecystitis today.  I do suspect that she recently passed a stone and this was the cause of her pain.  Patient will need close follow-up with general surgery for elective cholecystectomy.  We had a long discussion about the importance of return to the emergency department for return of pain, persistent vomiting or worsening symptoms as the risk of a common bile duct stone in her is high and her liver enzymes are elevated today.  Patient states understanding and is in agreement with this plan.  Discharge, her abdomen is soft and nontender.  Final Clinical Impressions(s) / ED Diagnoses   Final diagnoses:  Calculus of gallbladder and bile duct without cholecystitis or obstruction    ED Discharge Orders         Ordered    oxyCODONE (ROXICODONE) 5 MG immediate release tablet  Every 6 hours PRN     08/20/18 0623    ondansetron (ZOFRAN ODT) 8 MG disintegrating tablet     08/20/18 0623           Dmetrius Ambs, Dahlia ClientHannah, PA-C 08/20/18 0630    Molpus, Jonny RuizJohn, MD 08/20/18 [phone]

## 2018-08-20 NOTE — ED Triage Notes (Signed)
Patient is complaining of upper abdominal pain and nausea. Patient states she has had this pain since Friday. Patient states it feels like indigestion and it is not going away.

## 2018-08-20 NOTE — Discharge Instructions (Addendum)
1. Medications: zofran, oxycodone, usual home medications; not take Tylenol for pain control 2. Treatment: rest, drink plenty of fluids, advance diet slowly; avoid spicy, greasy or dairy heavy foods 3. Follow Up: Please call [facility] this morning to make an appointment for next week.  Please return immediately to the emergency department for return of pain, persistent vomiting, development of fevers or any other symptoms.  If you are unable to schedule an appointment with general surgery within the next week please follow-up with your primary care provider for repeat lab work.  I have prescribed a small dose of oxycodone for pain control and Zofran for nausea however if this is not helping your pain or your pain persists you will need to return immediately to the emergency department for repeat evaluation.

## 2019-04-16 IMAGING — US US MFM OB LIMITED
1 series · 15 of 25 positions shown · non-contrast
Comparison: none

[Series 1: us mfm ob limited · 15 of 25 slices shown]
[im 1/25]
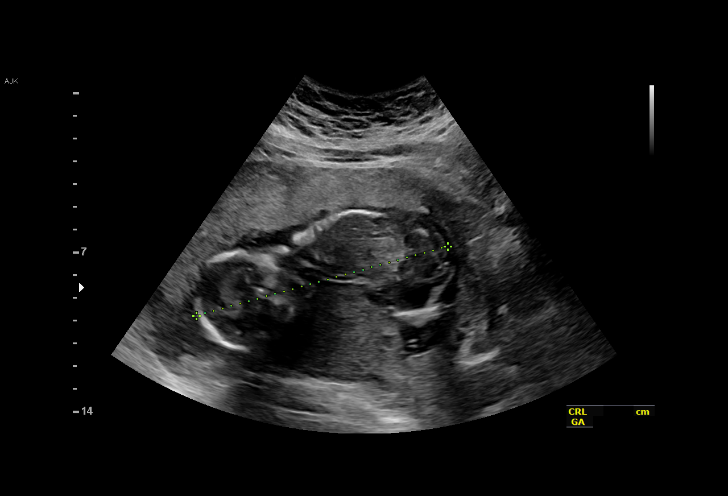
[im 3/25]
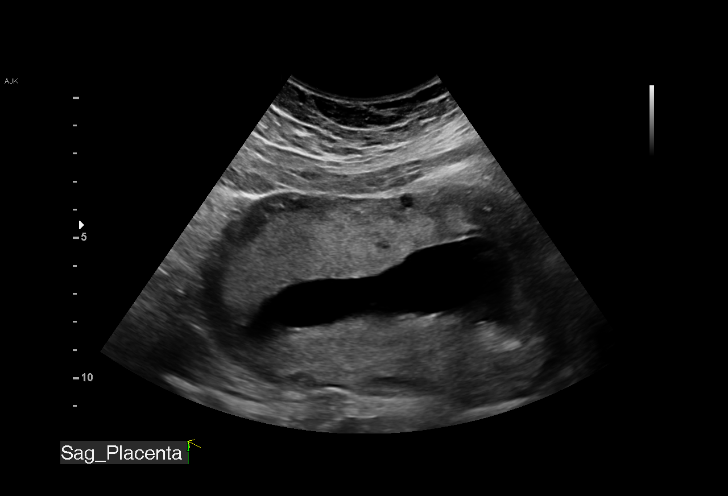
[im 5/25]
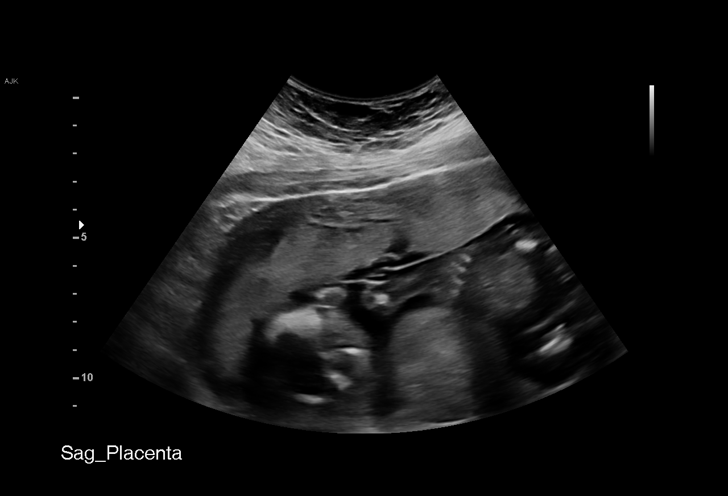
[im 6/25]
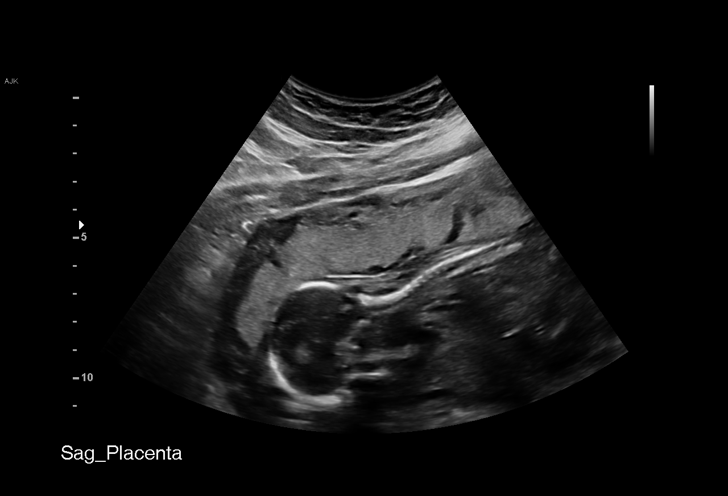
[im 8/25]
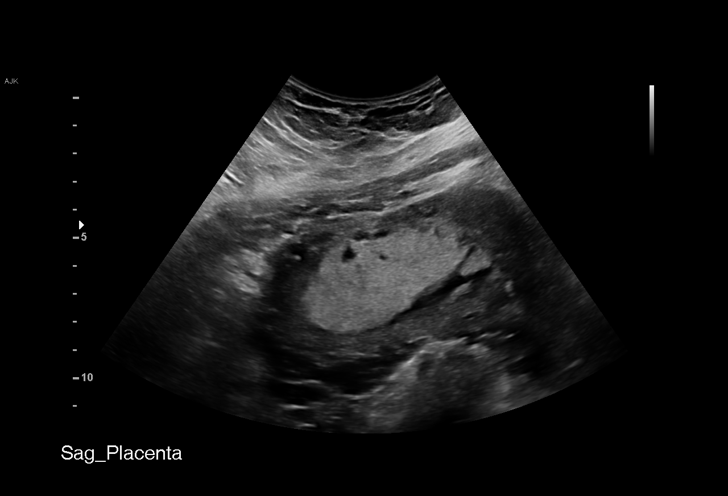
[im 10/25]
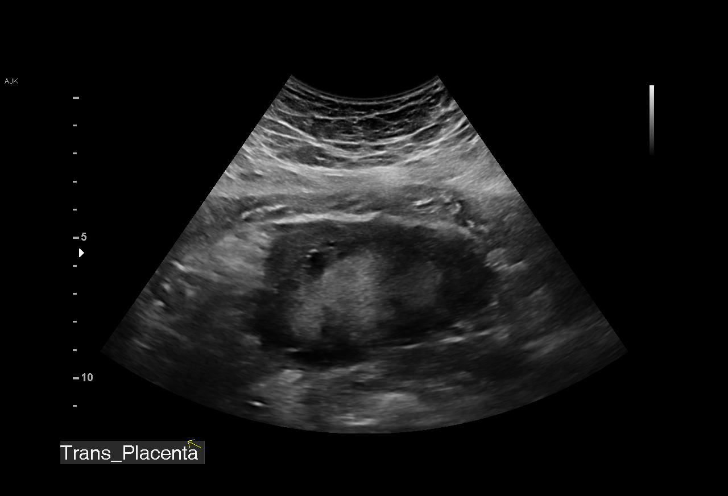
[im 11/25]
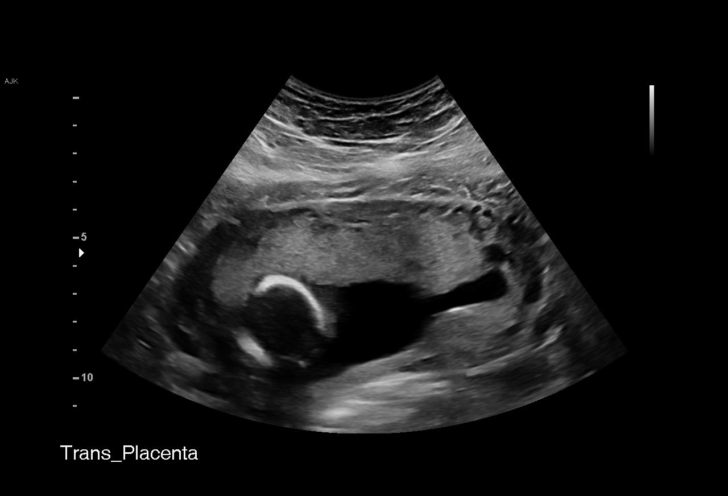
[im 13/25]
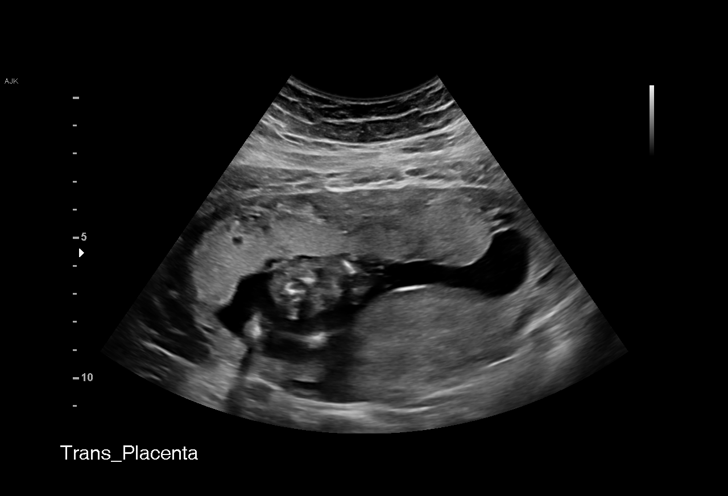
[im 15/25]
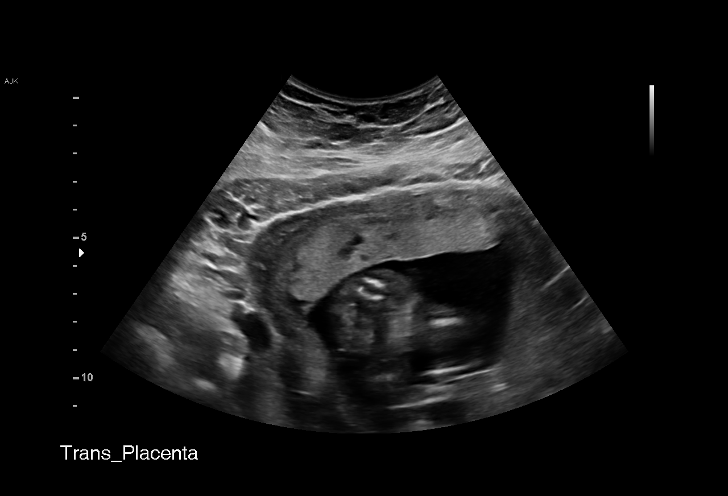
[im 16/25]
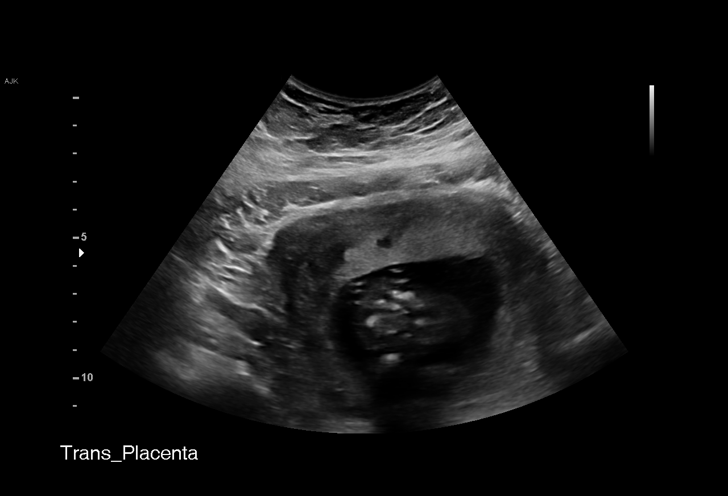
[im 18/25]
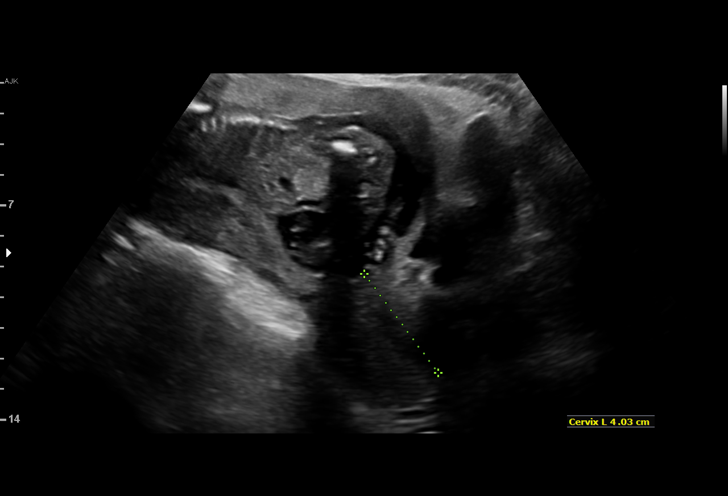
[im 20/25]
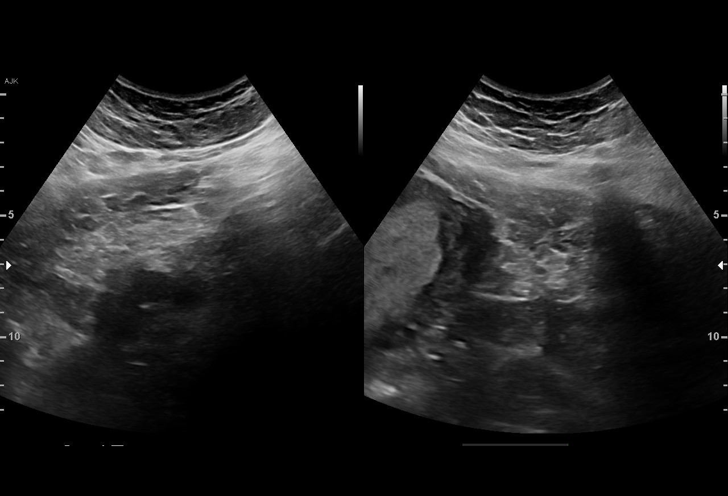
[im 21/25]
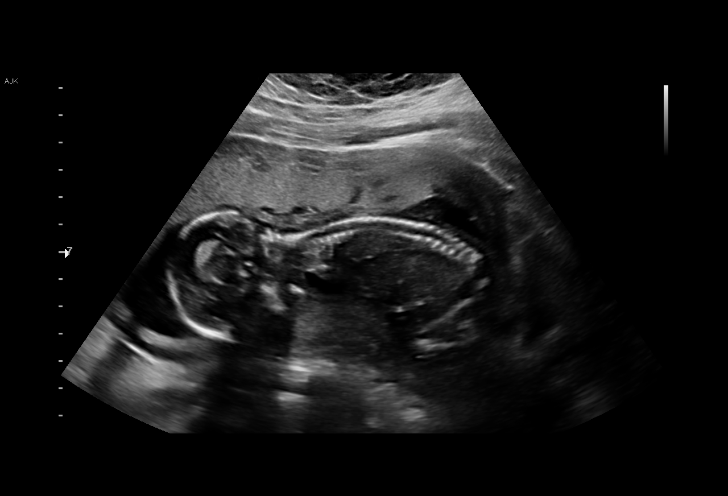
[im 23/25]
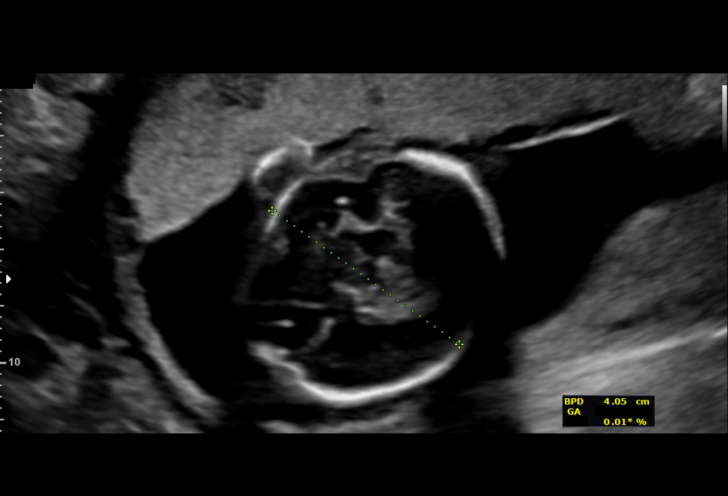
[im 25/25]
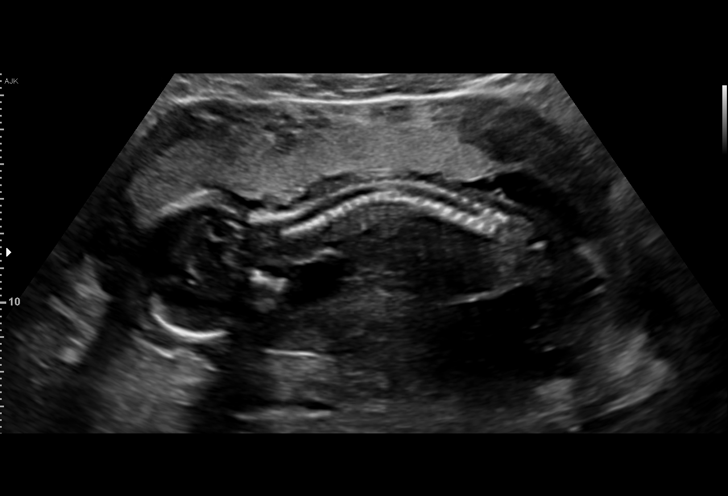

[15 of 25 positions shown; findings below may reference images not displayed]

Indications

18 weeks gestation of pregnancy
Abdominal pain in pregnancy
Encounter for uncertain dates
Maternal morbid obesity
OB History

Blood Type:            Height:  5'5"   Weight (lb):  270       BMI:
Fetal Evaluation

Num Of Fetuses:     1
Fetal Heart         157
Rate(bpm):
Cardiac Activity:   Observed
Presentation:       Variable
Placenta:           Anterior, above cervical os
P. Cord Insertion:  Visualized

Amniotic Fluid
AFI FV:      Subjectively within normal limits

Largest Pocket(cm)
3.16

Comment:    No placental abruption or previa identified on today's exam.
Biometry

BPD:      39.5  mm     G. Age:  18w 0d         51  %
Gestational Age

LMP:           22w 6d        Date:  06/20/17                 EDD:   03/27/18
U/S Today:     18w 0d                                        EDD:   04/30/18
Best:          18w 0d     Det. By:  U/S (11/27/17)           EDD:   04/30/18
Cervix Uterus Adnexa

Cervix
Length:           4.03  cm.
Normal appearance by transabdominal scan. Appears closed, without
funnelling.

Uterus
No abnormality visualized.

Left Ovary
Not visualized.

Right Ovary
Not visualized.

Adnexa:       No abnormality visualized.
Impression

Single living intrauterine pregnancy at 18w 0d, remote read of
limited ultrasound only
Variable presentation.
Placenta Anterior, above cervical os.
Normal amniotic fluid volume.
cervix is long and closed
Recommendations

Recommend formal fetal survey and biometry when feasible.

## 2019-08-24 IMAGING — US US MFM OB FOLLOW-UP
1 series · 14 of 28 positions shown · non-contrast
Comparison: none

[Series 1: us mfm ob follow-up · 14 of 40 slices shown]
[im 2/40]
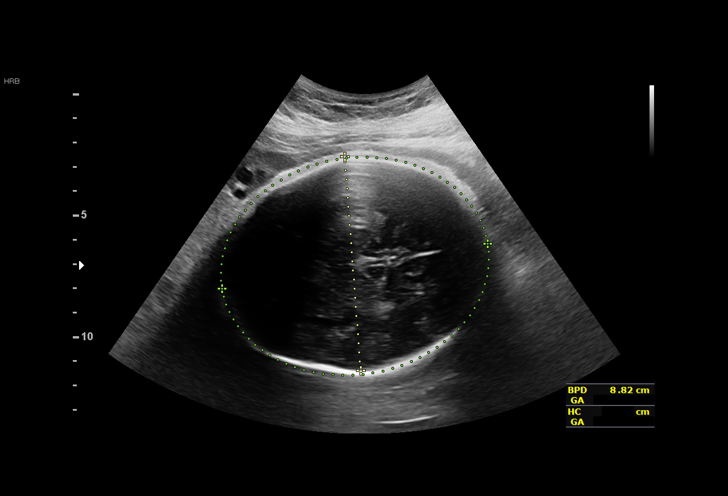
[im 5/40]
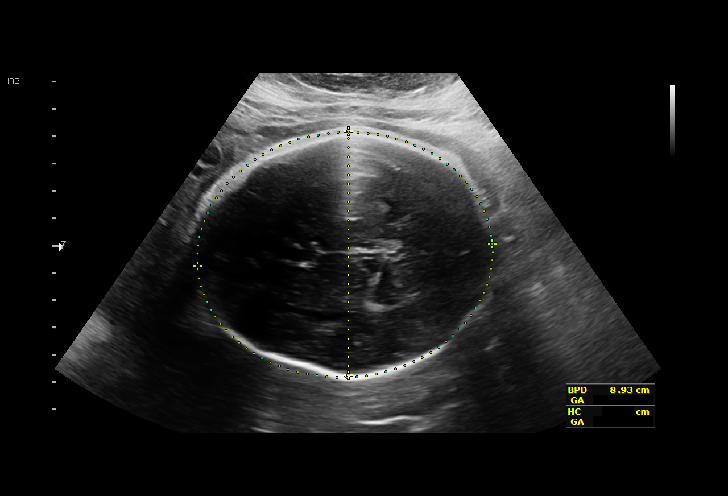
[im 8/40]
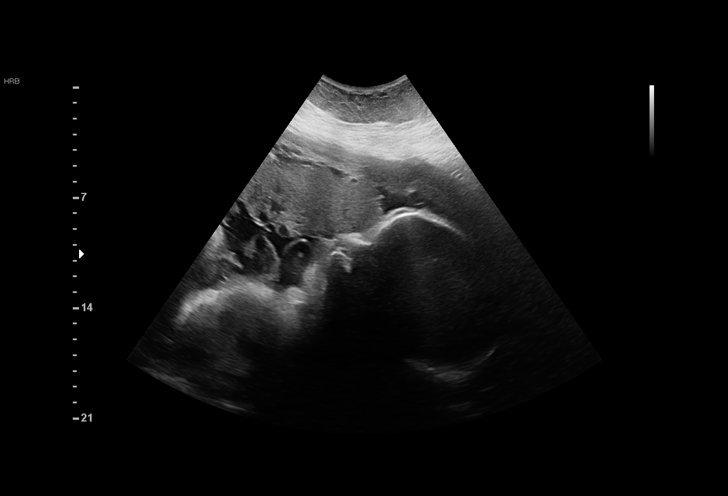
[im 11/40]
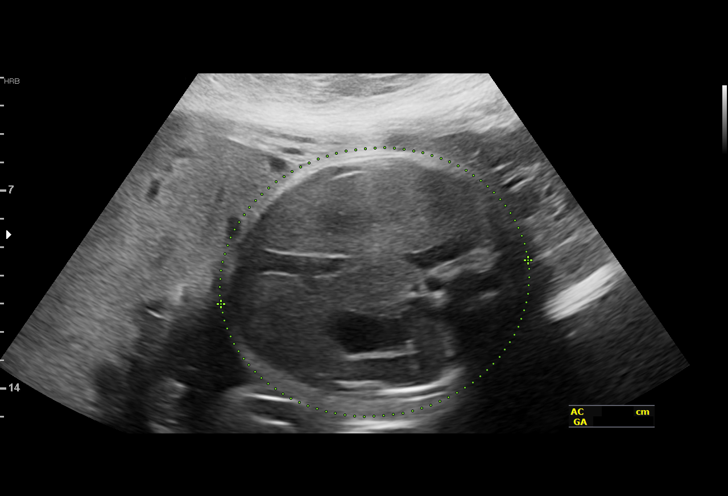
[im 14/40]
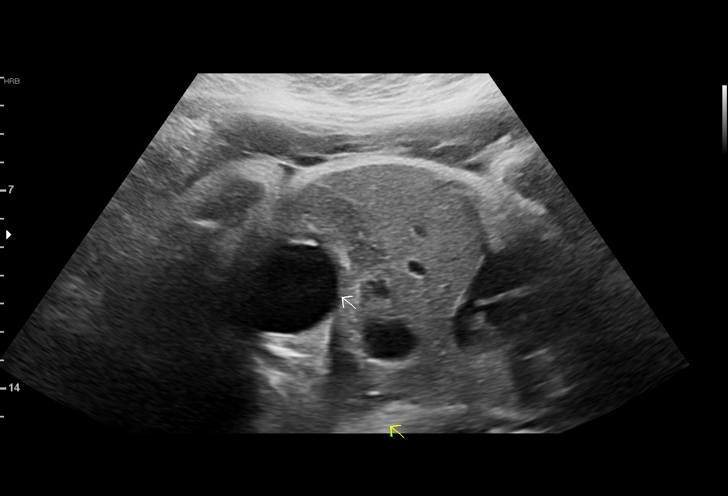
[im 16/40]
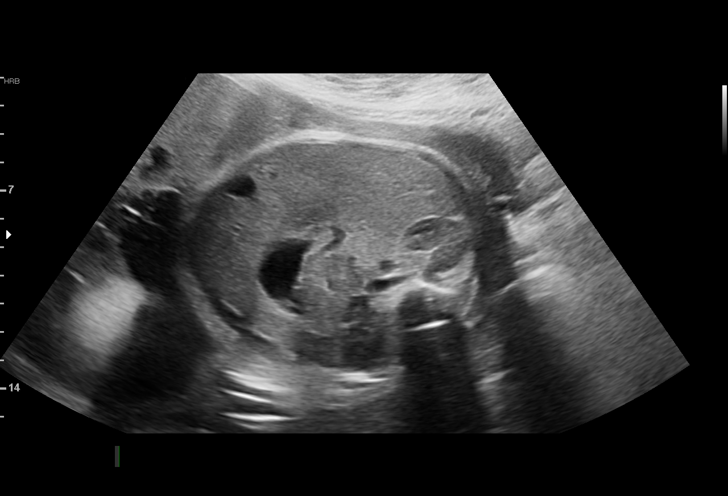
[im 19/40]
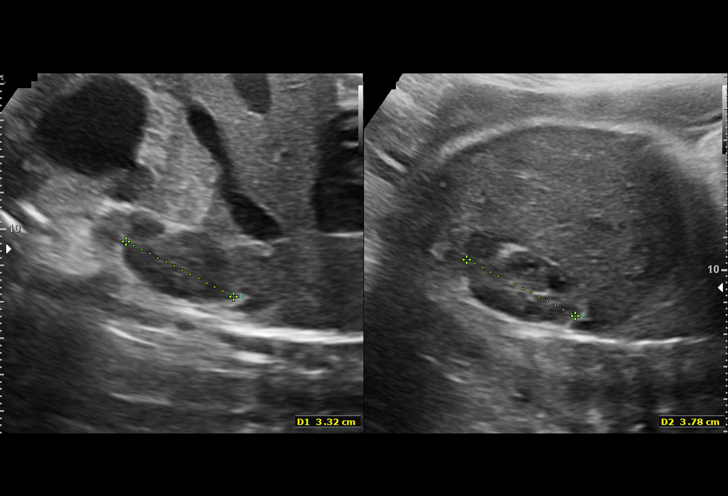
[im 22/40]
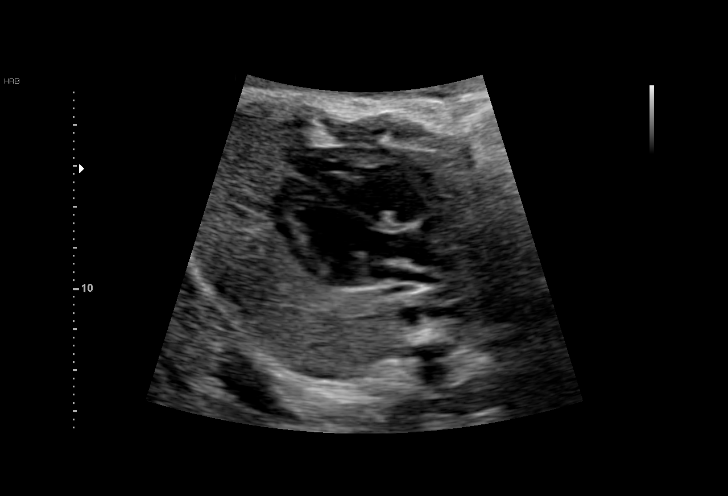
[im 25/40]
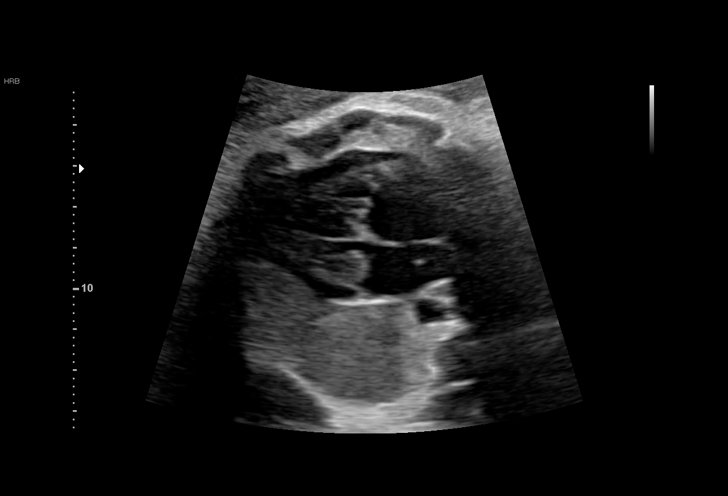
[im 28/40]
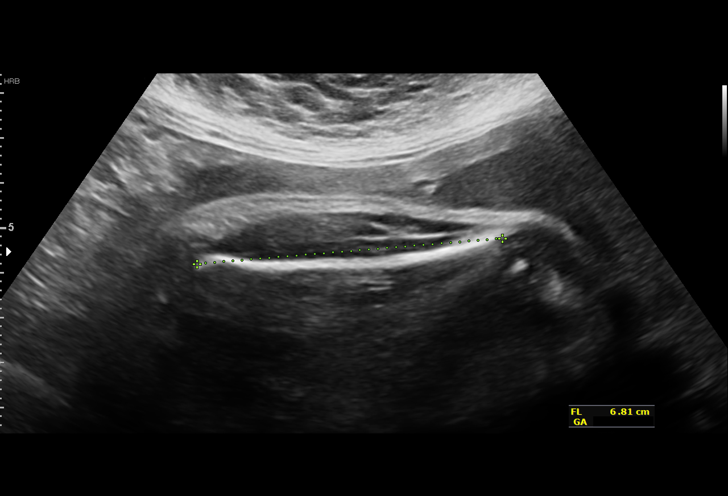
[im 31/40]
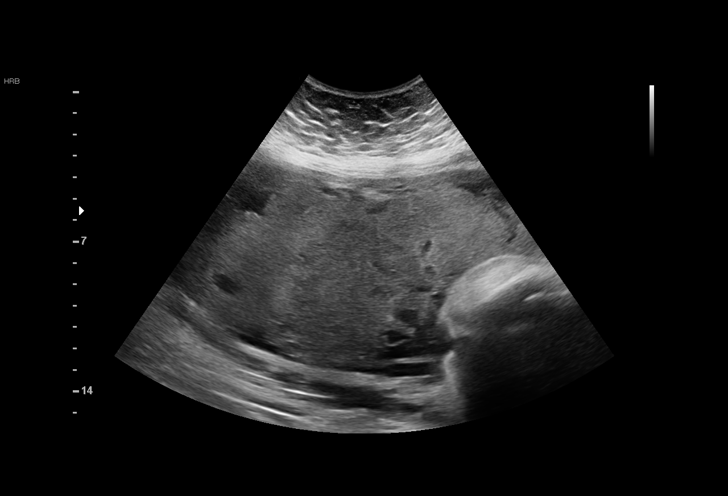
[im 34/40]
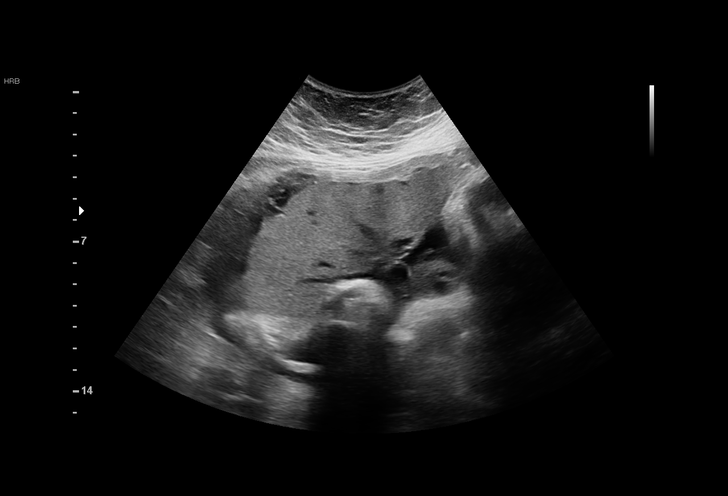
[im 37/40]
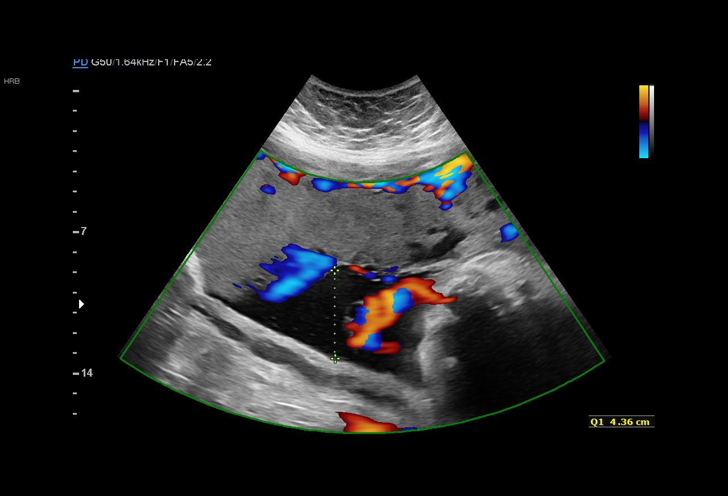
[im 40/40]
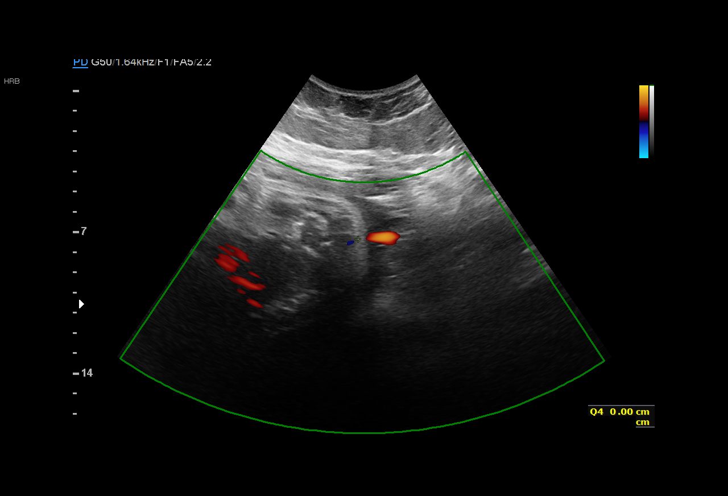

[14 of 28 positions shown; findings below may reference images not displayed]

Indications

35 weeks gestation of pregnancy
History of congenital or genetic condition
(Von Willebrand Disease)
Poor obstetric history: Previous
preeclampsia / eclampsia/gestational HTN
Maternal morbid obesity
Hypertension - Chronic/Pre-existing
OB History

Blood Type:            Height:  5'5"   Weight (lb):  270       BMI:
Gravidity:    4         Term:   2        Prem:   0        SAB:   0
TOP:          1       Ectopic:  0        Living: 1
Fetal Evaluation

Num Of Fetuses:     1
Fetal Heart         148
Rate(bpm):
Cardiac Activity:   Observed
Presentation:       Cephalic
Placenta:           Anterior
P. Cord Insertion:  Previously Visualized

Amniotic Fluid
AFI FV:      low-normal

AFI Sum(cm)     %Tile       Largest Pocket(cm)
7.34            4

RUQ(cm)       RLQ(cm)       LUQ(cm)        LLQ(cm)
4.36          0             0
Biometry

BPD:      88.7  mm     G. Age:  35w 6d         61  %    CI:        80.11   %    70 - 86
FL/HC:      21.4   %    20.1 -
HC:      313.1  mm     G. Age:  35w 1d         10  %    HC/AC:      0.98        0.93 -
AC:      320.8  mm     G. Age:  36w 0d         67  %    FL/BPD:     75.6   %    71 - 87
FL:       67.1  mm     G. Age:  34w 4d         18  %    FL/AC:      20.9   %    20 - 24
HUM:        58  mm     G. Age:  33w 4d         27  %

Est. FW:    0687  gm    5 lb 15 oz      59  %
Gestational Age

LMP:           41w 3d        Date:  06/20/17                 EDD:   03/27/18
U/S Today:     35w 3d                                        EDD:   05/08/18
Best:          35w 5d     Det. By:  U/S  (01/12/18)          EDD:   05/06/18
Anatomy

Cranium:               Appears normal         Aortic Arch:            Previously seen
Cavum:                 Appears normal         Ductal Arch:            Previously seen
Ventricles:            Appears normal         Diaphragm:              Previously seen
Choroid Plexus:        Previously seen        Stomach:                Appears normal, left
sided
Cerebellum:            Previously seen        Abdomen:                Appears normal
Posterior Fossa:       Previously seen        Abdominal Wall:         Previously seen
Nuchal Fold:           Not applicable (>20    Cord Vessels:           Previously seen
wks GA)
Face:                  Orbits and profile     Kidneys:                Appear normal
previously seen
Lips:                  Previously seen        Bladder:                Appears normal
Thoracic:              Appears normal         Spine:                  Previously seen
Heart:                 Previously seen        Upper Extremities:      Previously seen
RVOT:                  Appears normal         Lower Extremities:      Previously seen
LVOT:                  Appears normal

Other:  Fetus appears to be a female. Lt heel previously visualized.
Technically difficult due to maternal habitus
Cervix Uterus Adnexa

Cervix
Not visualized (advanced GA >74wks)
Impression

Chronic hypertension. Well-controlled without medications.

Amniotic fluid is decreased for this gestational age (4th
percentile), but not olighohydramnios. Good fetal activity is
seen. Fetal growth is appropriate for gestational age.
Antenatal testing is reassuring. BPP [DATE].
Recommendations

-If blood pressure remains well-controlled, I recommend
delivery at 39 weeks provided oligohydramnios does not
develop.
-vWD should not pose any problems and the patient did not
have PPH after delivery in her previous pregnancies.
-Continue weekly antenatal testing till delivery.

## 2019-09-07 IMAGING — US US MFM FETAL BPP W/O NON-STRESS
1 series · 16 of 28 positions shown · non-contrast
Comparison: none

[Series 1: us mfm fetal bpp w/o non-stress · 31 acquisitions, 16 frames shown]
[im 1/31]
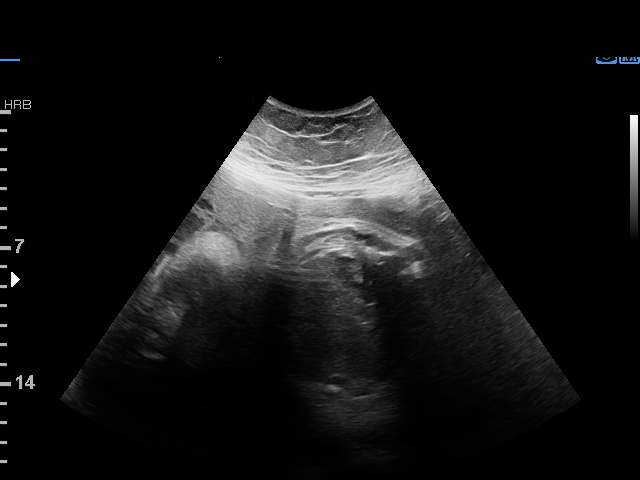
[im 3/31]
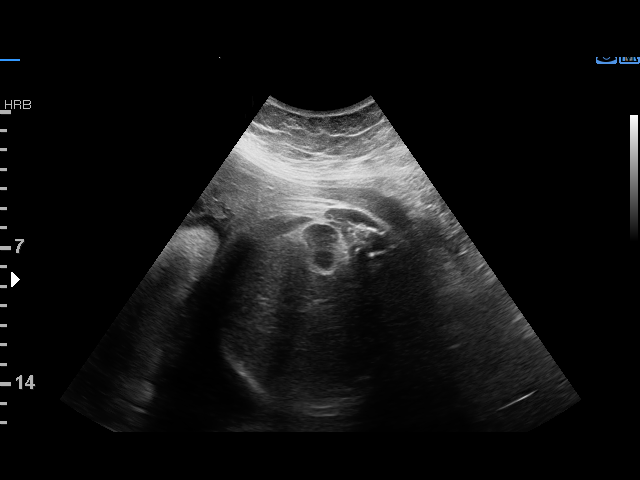
[im 5/31]
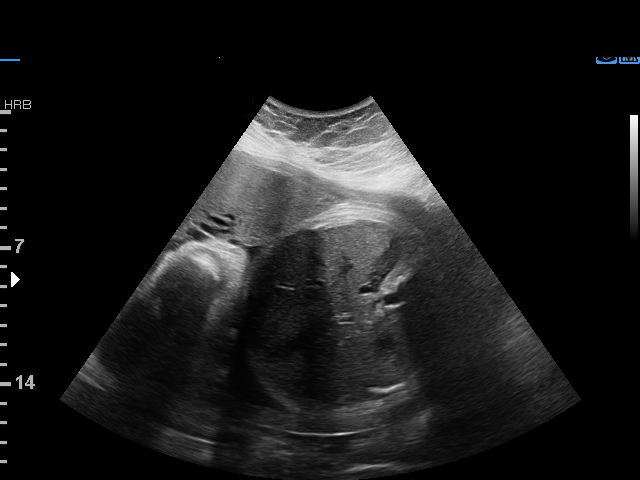
[im 7/31]
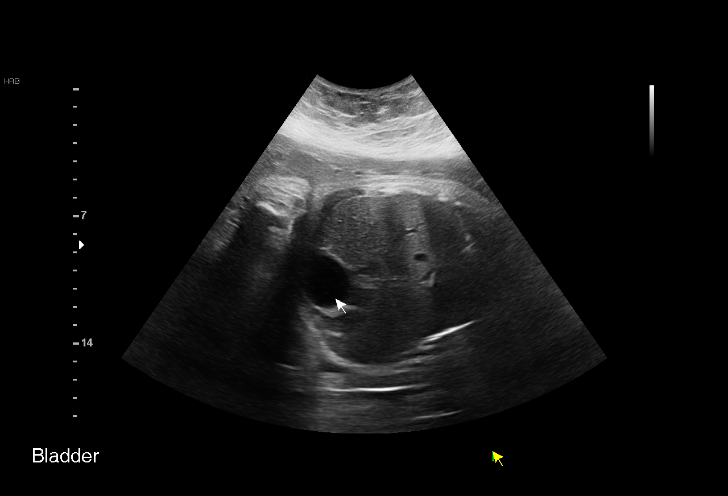
[im 8/31]
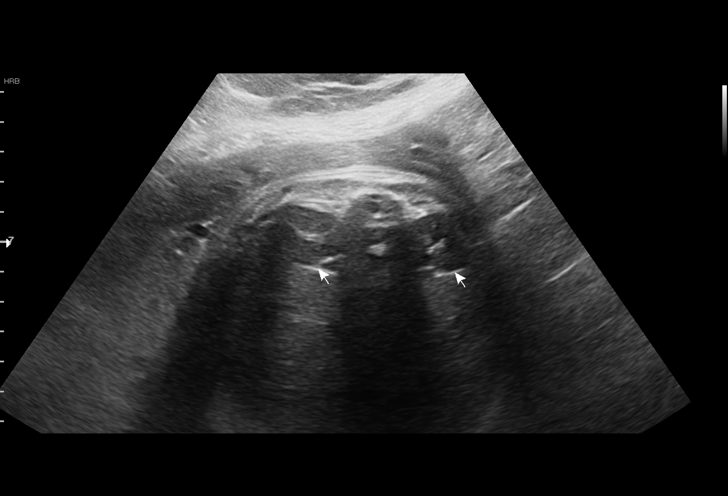
[im 11/31]
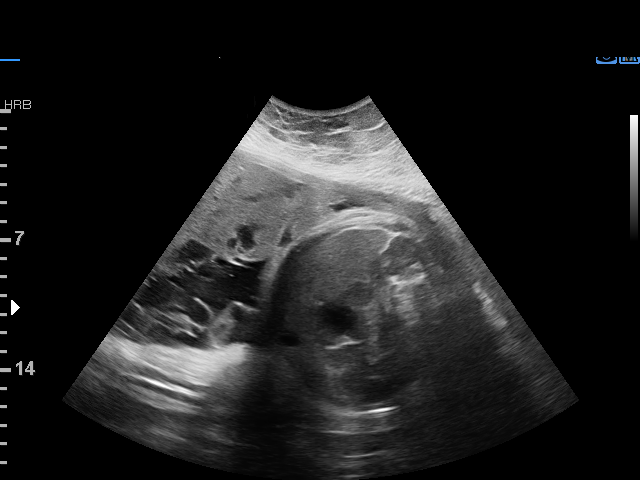
[im 13/31]
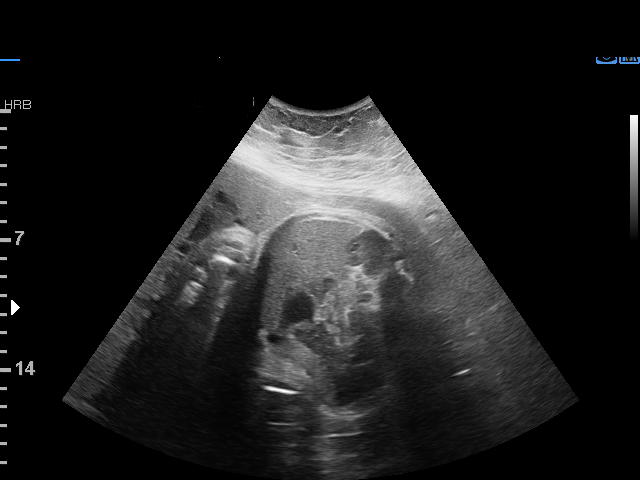
[im 15/31]
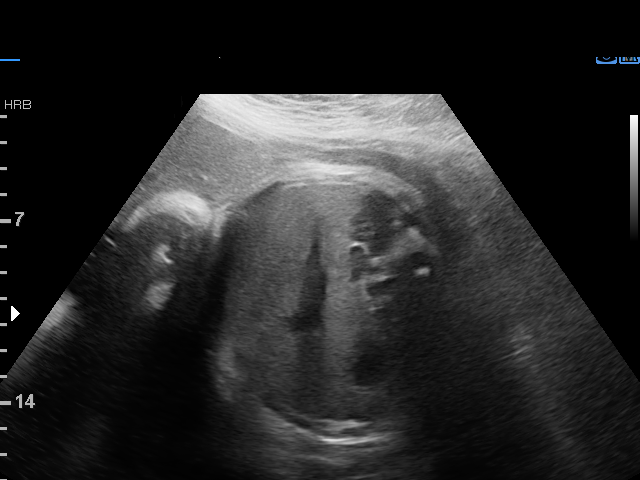
[im 16/31]
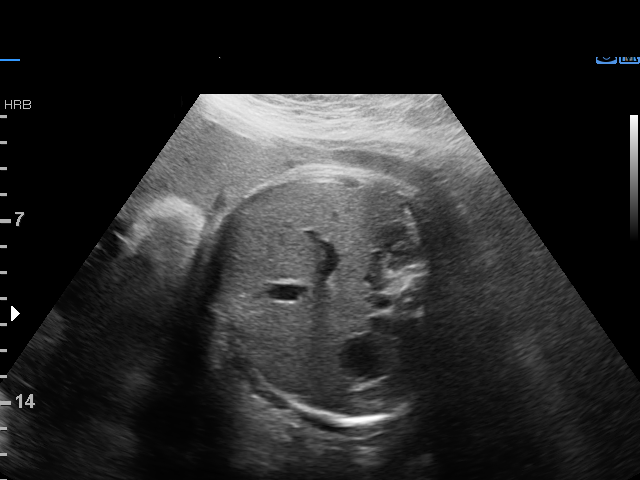
[im 18/31]
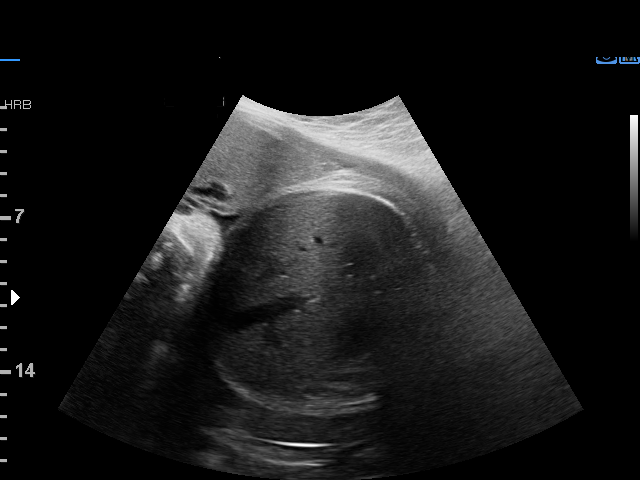
[im 21/31]
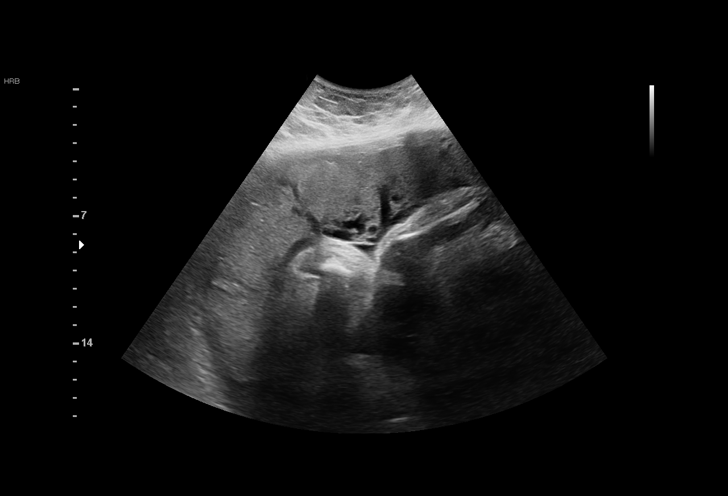
[im 23/31]
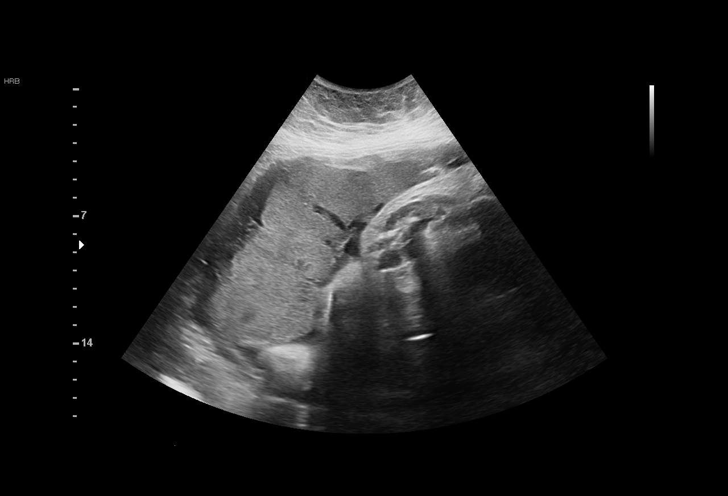
[im 24/31]
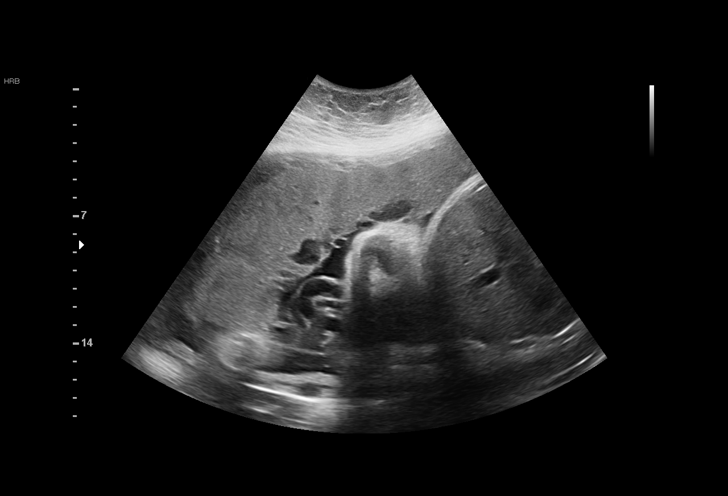
[im 26/31]
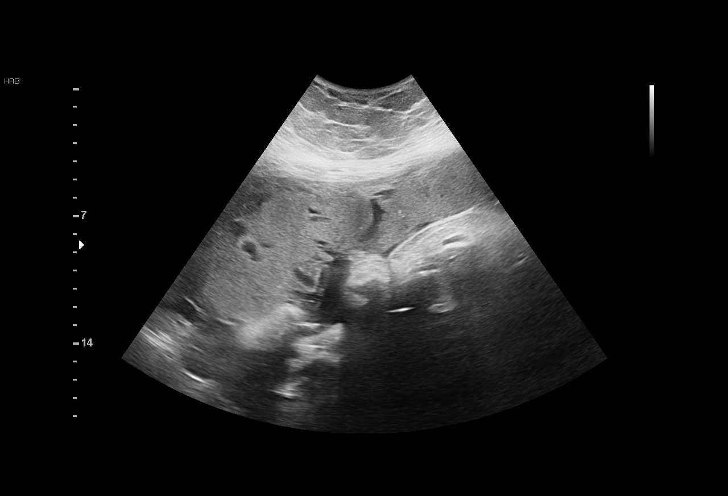
[im 28/31]
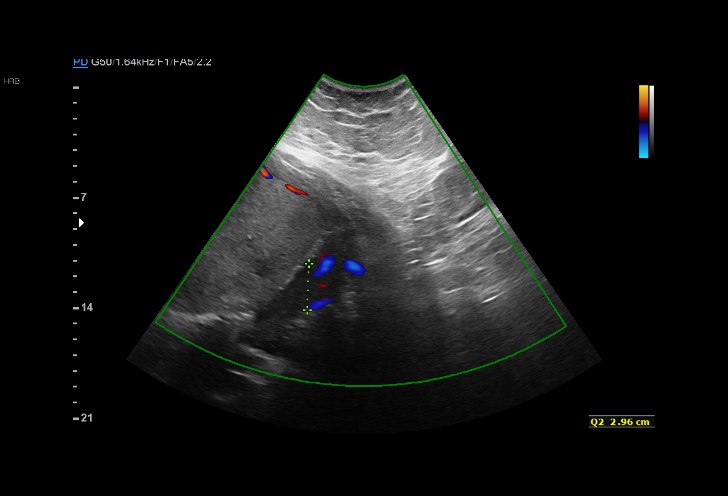
[im 31/31]
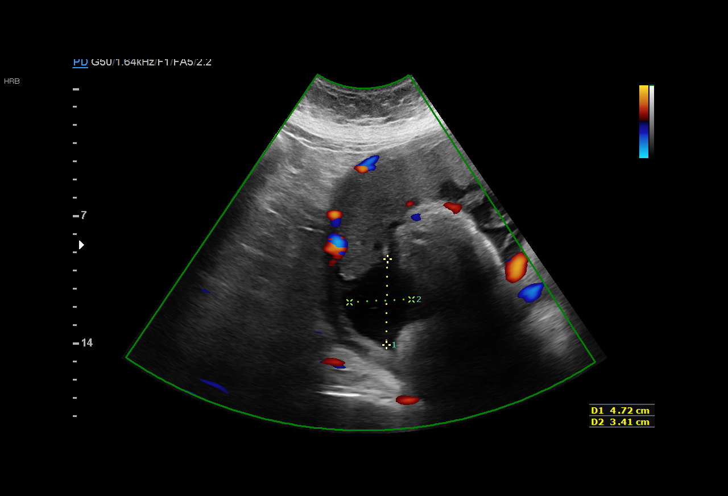

[16 of 28 positions shown; findings below may reference images not displayed]

Indications

History of congenital or genetic condition
(Von Willebrand Disease)
Poor obstetric history: Previous
preeclampsia / eclampsia/gestational HTN
38 weeks gestation of pregnancy
Maternal morbid obesity
Hypertension - Chronic/Pre-existing
Vital Signs

Height:        5'5"
Fetal Evaluation

Num Of Fetuses:         1
Fetal Heart Rate(bpm):  153
Cardiac Activity:       Observed
Presentation:           Cephalic
Placenta:               Anterior

Amniotic Fluid
AFI FV:      Within normal limits

AFI Sum(cm)     %Tile       Largest Pocket(cm)
9.34            22

RUQ(cm)       RLQ(cm)       LUQ(cm)        LLQ(cm)
3.03          0
Biophysical Evaluation
Amniotic F.V:   Pocket => 2 cm two         F. Tone:        Observed
planes
F. Movement:    Observed                   Score:          [DATE]
F. Breathing:   Observed
OB History

Gravidity:    4         Term:   2        Prem:   0        SAB:   0
TOP:          1       Ectopic:  0        Living: 1
Gestational Age

LMP:           43w 3d        Date:  06/20/17                 EDD:   03/27/18
Best:          38w 4d     Det. By:  U/S  (11/27/17)          EDD:   04/30/18
Anatomy

Thoracic:              Appears normal         Abdomen:                Appears normal
Diaphragm:             Appears normal         Kidneys:                Appear normal
Stomach:               Appears normal, left   Bladder:                Appears normal
sided
Cervix Uterus Adnexa

Cervix
Not visualized (advanced GA >21wks)
Impression

Amniotic fluid is normal and good fetal activity is seen.
Antenatal testing is reassuring. BPP [DATE].
Cephalic presentation.

Patient reports she will be undergoing induction of labor on
04/24/18.

## 2020-06-07 IMAGING — US US ABDOMEN LIMITED
1 series · 14 of 25 positions shown · non-contrast
Comparison: None.

CLINICAL DATA: RIGHT upper quadrant pain.

EXAM:
ULTRASOUND ABDOMEN LIMITED RIGHT UPPER QUADRANT

[Series 1: us abdomen limited · 0.31mm/px · 14 of 35 slices shown]
[im 1/35]
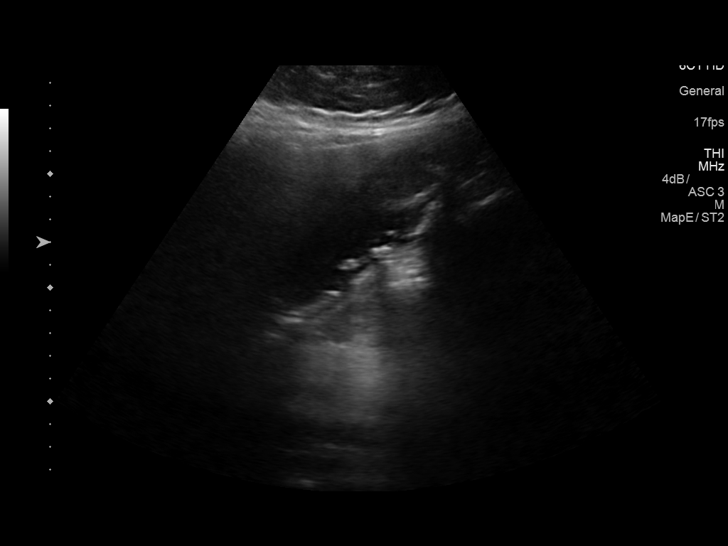
[im 3/35]
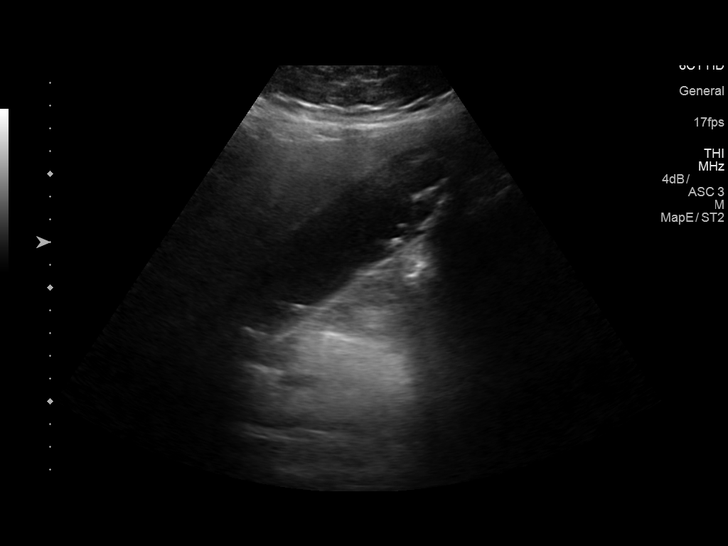
[im 6/35]
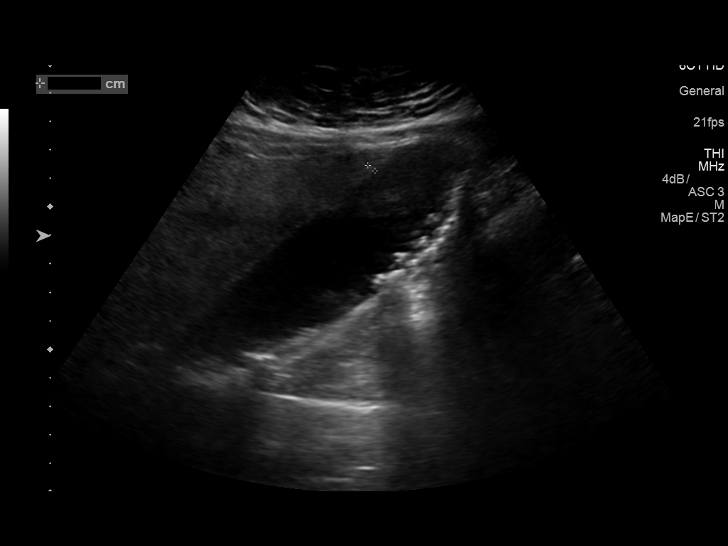
[im 9/35]
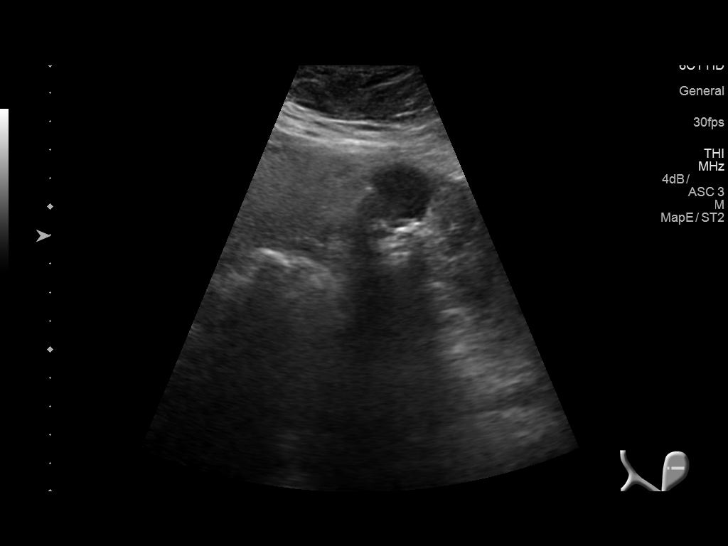
[im 12/35]
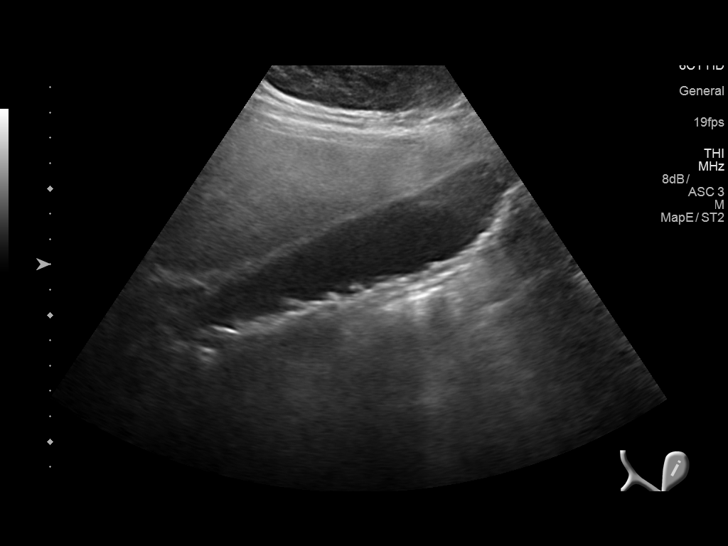
[im 13/35]
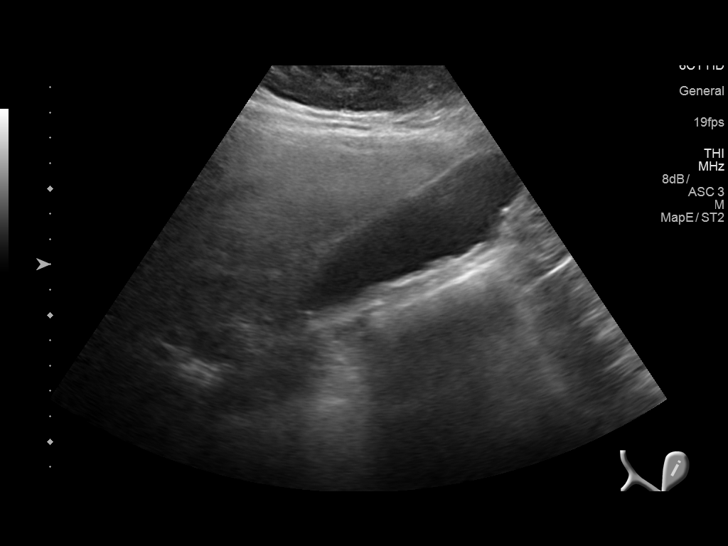
[im 16/35]
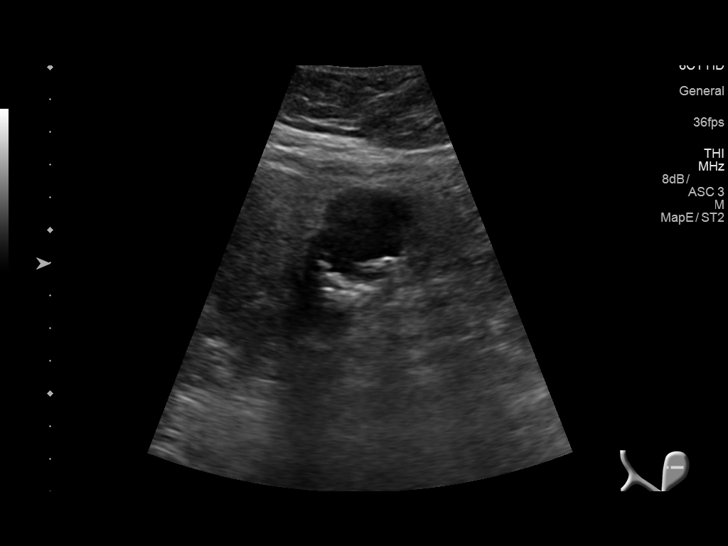
[im 19/35]
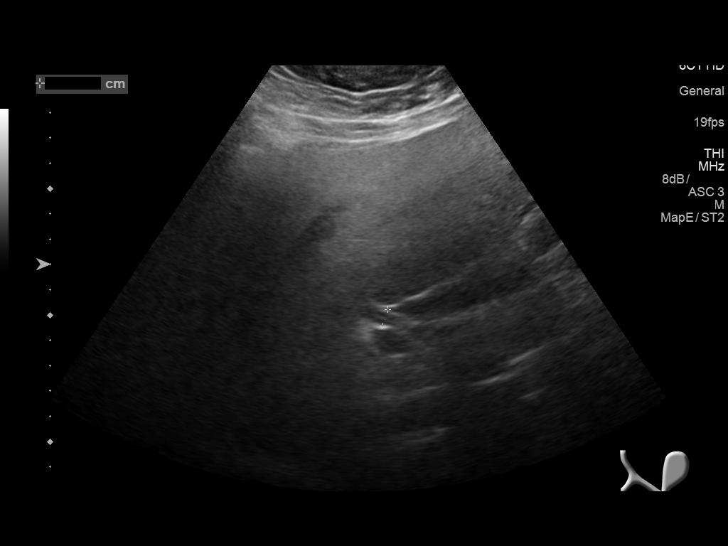
[im 22/35]
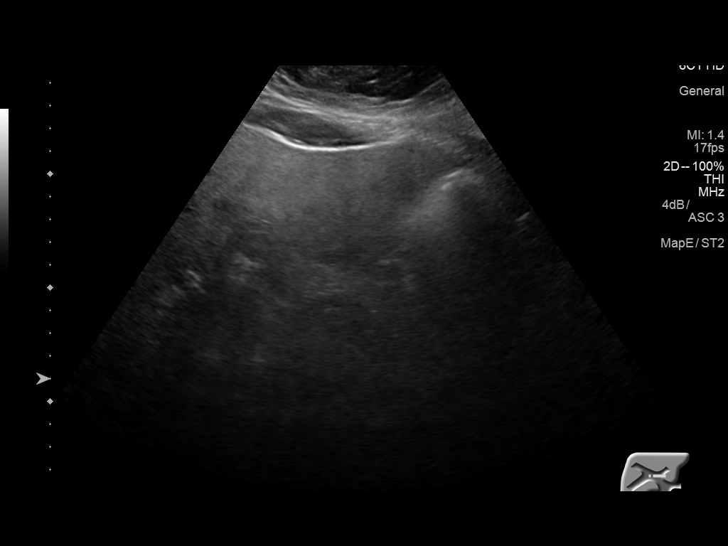
[im 23/35]
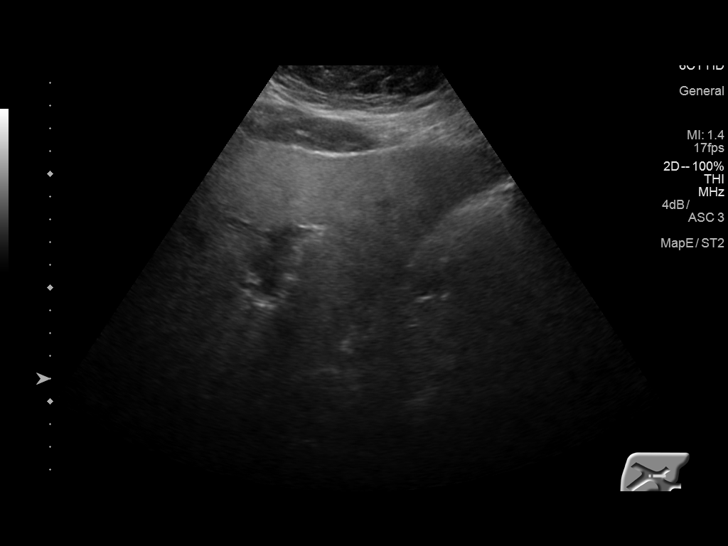
[im 26/35]
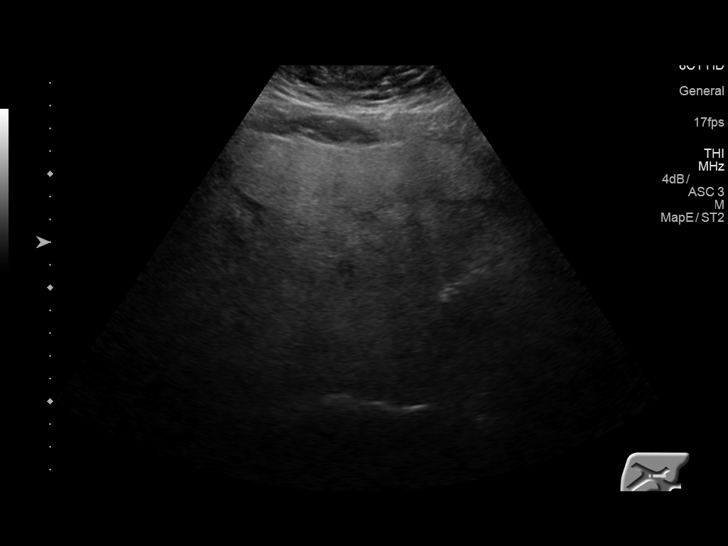
[im 29/35]
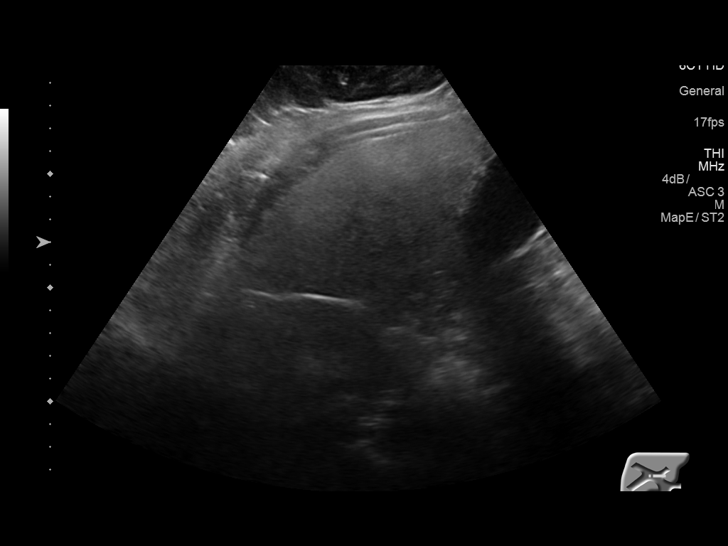
[im 32/35]
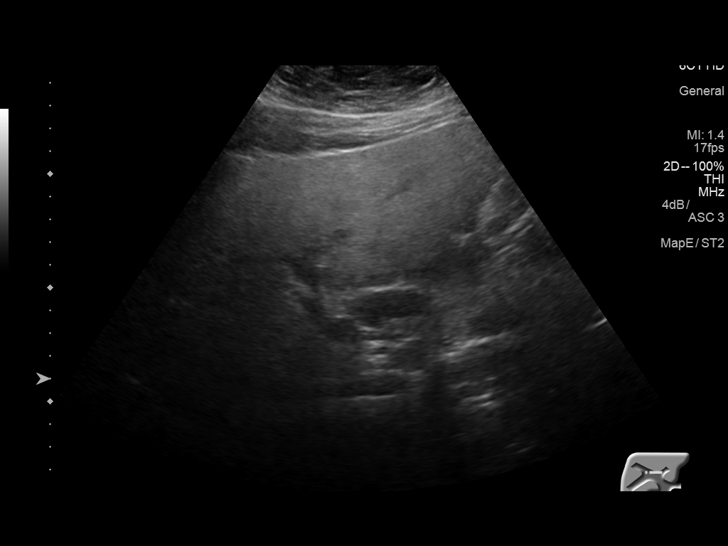
[im 35/35]
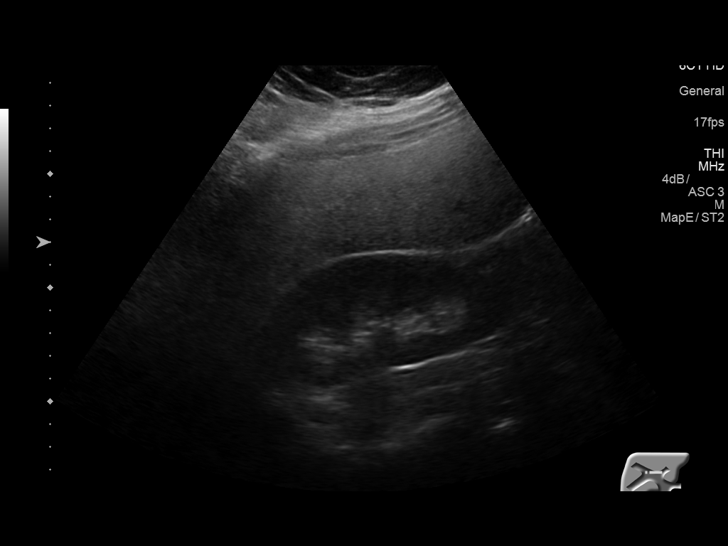

[14 of 25 positions shown; findings below may reference images not displayed]

FINDINGS: Gallbladder:

Multiple echogenic gallstones measuring to 6 mm with acoustic
shadowing. Gallbladder wall is top-normal at 2-3 mm. No
pericholecystic fluid. No sonographic Murphy sign elicited.

Common bile duct:

Diameter: 7 mm.

Liver:

No focal lesion identified. Increased parenchymal echogenicity.
Portal vein is patent on color Doppler imaging with normal direction
of blood flow towards the liver.
IMPRESSION: 1. Cholelithiasis without sonographic findings of acute
cholecystitis.
2. Mildly dilated common bile duct can be seen with recently passed
stones. No sonographically identified choledocholithiasis.
3. Hepatic steatosis/hepatocellular disease.

## 2020-08-06 ENCOUNTER — Other Ambulatory Visit: Payer: Self-pay | Admitting: Obstetrics and Gynecology

## 2020-08-06 DIAGNOSIS — N915 Oligomenorrhea, unspecified: Secondary | ICD-10-CM

## 2020-08-12 ENCOUNTER — Ambulatory Visit
Admission: RE | Admit: 2020-08-12 | Discharge: 2020-08-12 | Disposition: A | Discharge status: Home / Self Care | Payer: 59 | Source: Ambulatory Visit | Attending: Obstetrics and Gynecology | Admitting: Obstetrics and Gynecology

## 2020-08-12 DIAGNOSIS — N915 Oligomenorrhea, unspecified: Secondary | ICD-10-CM

## 2021-01-01 ENCOUNTER — Inpatient Hospital Stay (HOSPITAL_COMMUNITY)
Admission: AD | Admit: 2021-01-01 | Discharge: 2021-01-01 | Disposition: A | Discharge status: Home / Self Care | Payer: Medicaid Other | Attending: Obstetrics & Gynecology | Admitting: Obstetrics & Gynecology

## 2021-01-01 ENCOUNTER — Inpatient Hospital Stay (HOSPITAL_COMMUNITY): Payer: Medicaid Other

## 2021-01-01 ENCOUNTER — Other Ambulatory Visit: Payer: Self-pay

## 2021-01-01 ENCOUNTER — Encounter (HOSPITAL_COMMUNITY): Payer: Self-pay | Admitting: Obstetrics & Gynecology

## 2021-01-01 DIAGNOSIS — Z3A00 Weeks of gestation of pregnancy not specified: Secondary | ICD-10-CM | POA: Diagnosis not present

## 2021-01-01 DIAGNOSIS — R102 Pelvic and perineal pain: Secondary | ICD-10-CM | POA: Diagnosis not present

## 2021-01-01 DIAGNOSIS — R109 Unspecified abdominal pain: Secondary | ICD-10-CM

## 2021-01-01 DIAGNOSIS — Z6791 Unspecified blood type, Rh negative: Secondary | ICD-10-CM

## 2021-01-01 DIAGNOSIS — O26891 Other specified pregnancy related conditions, first trimester: Secondary | ICD-10-CM

## 2021-01-01 DIAGNOSIS — O26899 Other specified pregnancy related conditions, unspecified trimester: Secondary | ICD-10-CM | POA: Insufficient documentation

## 2021-01-01 DIAGNOSIS — Z87891 Personal history of nicotine dependence: Secondary | ICD-10-CM | POA: Diagnosis not present

## 2021-01-01 DIAGNOSIS — O3680X0 Pregnancy with inconclusive fetal viability, not applicable or unspecified: Secondary | ICD-10-CM

## 2021-01-01 LAB — URINALYSIS, ROUTINE W REFLEX MICROSCOPIC
Bilirubin Urine: NEGATIVE
Glucose, UA: NEGATIVE mg/dL
Hgb urine dipstick: NEGATIVE
Ketones, ur: NEGATIVE mg/dL
Leukocytes,Ua: NEGATIVE
Nitrite: NEGATIVE
Protein, ur: NEGATIVE mg/dL
Specific Gravity, Urine: 1.021 (ref 1.005–1.030)
pH: 8 (ref 5.0–8.0)

## 2021-01-01 LAB — COMPREHENSIVE METABOLIC PANEL
ALT: 35 U/L (ref 0–44)
AST: 29 U/L (ref 15–41)
Albumin: 3.8 g/dL (ref 3.5–5.0)
Alkaline Phosphatase: 38 U/L (ref 38–126)
Anion gap: 6 (ref 5–15)
BUN: 9 mg/dL (ref 6–20)
CO2: 25 mmol/L (ref 22–32)
Calcium: 9 mg/dL (ref 8.9–10.3)
Chloride: 105 mmol/L (ref 98–111)
Creatinine, Ser: 0.6 mg/dL (ref 0.44–1.00)
GFR, Estimated: >60 mL/min (ref >60)
Glucose, Bld: 99 mg/dL (ref 70–99)
Potassium: 3.9 mmol/L (ref 3.5–5.1)
Sodium: 136 mmol/L (ref 135–145)
Total Bilirubin: 0.7 mg/dL (ref 0.3–1.2)
Total Protein: 6.5 g/dL (ref 6.5–8.1)

## 2021-01-01 LAB — CBC
HCT: 39.1 % (ref 36.0–46.0)
Hemoglobin: 12.6 g/dL (ref 12.0–15.0)
MCH: 25.4 pg — ABNORMAL LOW (ref 26.0–34.0)
MCHC: 32.2 g/dL (ref 30.0–36.0)
MCV: 78.7 fL — ABNORMAL LOW (ref 80.0–100.0)
Platelets: 242 10*3/uL (ref 150–400)
RBC: 4.97 MIL/uL (ref 3.87–5.11)
RDW: 15.5 % (ref 11.5–15.5)
WBC: 9.1 10*3/uL (ref 4.0–10.5)
nRBC: 0 % (ref 0.0–0.2)

## 2021-01-01 LAB — WET PREP, GENITAL
Clue Cells Wet Prep HPF POC: NONE SEEN
Sperm: NONE SEEN
Trich, Wet Prep: NONE SEEN
Yeast Wet Prep HPF POC: NONE SEEN

## 2021-01-01 LAB — HCG, QUANTITATIVE, PREGNANCY: hCG, Beta Chain, Quant, S: 207 m[IU]/mL — ABNORMAL HIGH (ref <5)

## 2021-01-01 LAB — POCT PREGNANCY, URINE: Preg Test, Ur: POSITIVE — AB

## 2021-01-01 NOTE — MAU Note (Signed)
Patient discharge from the Family Room per Donia Ast, NP. No repeat VS or AVS signature.  Discharge instructions and education given. Patient has no further questions at this time.

## 2021-01-01 NOTE — MAU Provider Note (Signed)
History     CSN: [id number]  Arrival date and time: 01/01/21 1222   None     Chief Complaint  Patient presents with  . Abdominal Pain   [name] is a [age] y.o. [id number] at Unknown who presents to MAU for pelvic cramping, more on left side, unknown LMP d/t irregular cycles.  Pt denies VB, LOF, ctx, decreased FM, vaginal discharge/odor/itching. Pt denies N/V, abdominal pain, constipation, diarrhea, or urinary problems. Pt denies fever, chills, fatigue, sweating or changes in appetite. Pt denies SOB or chest pain. Pt denies dizziness, HA, light-headedness, weakness.   OB History    Gravida  5   Para  3   Term  3   Preterm  0   AB  1   Living  2     SAB  0   IAB  1   Ectopic  0   Multiple  0   Live Births  3           Past Medical History:  Diagnosis Date  . Asthma    childhood  . Chlamydia 2011  . Gastric reflux   . GERD (gastroesophageal reflux disease)   . Gonorrhea 2011  . Headache(784.0)   . Hypertension   . Pregnancy induced hypertension   . Von Willebrand disease (HCC)     Past Surgical History:  Procedure Laterality Date  . DILATION AND CURETTAGE OF UTERUS    . INDUCED ABORTION    . TONSILLECTOMY    . WISDOM TOOTH EXTRACTION      Family History  Problem Relation Age of Onset  . Cancer Mother        cervical  . Hypertension Father   . Arthritis Father   . Drug abuse Father   . Obesity Father   . Diabetes Brother   . ADD / ADHD Brother   . Depression Brother   . Obesity Brother   . Early death Son   . Miscarriages / Stillbirths Maternal Aunt   . Anxiety disorder Maternal Grandmother   . Cancer Maternal Grandmother   . Anesthesia problems Neg Hx     Social History   Tobacco Use  . Smoking status: Former Smoker    Years: 5.00    Types: Cigarettes    Quit date: 08/31/2014    Years since quitting: 6.3  . Smokeless tobacco: Never Used  Vaping Use  . Vaping Use: Never used  Substance Use Topics  . Alcohol use: Not  Currently    Comment: 3-4 drinks occasionally  . Drug use: No    Allergies: No Known Allergies  Medications Prior to Admission  Medication Sig Dispense Refill Last Dose  . acetaminophen (TYLENOL) 500 MG tablet Take 1,000 mg by mouth every 6 (six) hours as needed for mild pain, moderate pain or headache.     . bismuth subsalicylate (PEPTO BISMOL) 262 MG/15ML suspension Take 30 mLs by mouth every 6 (six) hours as needed for indigestion or diarrhea or loose stools.     . calcium carbonate (TUMS - DOSED IN MG ELEMENTAL CALCIUM) 500 MG chewable tablet Chew 1 tablet by mouth daily as needed for indigestion or heartburn.     . ondansetron (ZOFRAN ODT) 8 MG disintegrating tablet 8mg  ODT q4 hours prn nausea 4 tablet 0   . oxyCODONE (ROXICODONE) 5 MG immediate release tablet Take 1 tablet (5 mg total) by mouth every 6 (six) hours as needed for severe pain. 5 tablet 0  Review of Systems  Constitutional: Negative for chills, diaphoresis, fatigue and fever.  Eyes: Negative for visual disturbance.  Respiratory: Negative for shortness of breath.   Cardiovascular: Negative for chest pain.  Gastrointestinal: Negative for abdominal pain, constipation, diarrhea, nausea and vomiting.  Genitourinary: Positive for pelvic pain. Negative for dysuria, flank pain, frequency, urgency, vaginal bleeding and vaginal discharge.  Neurological: Negative for dizziness, weakness, light-headedness and headaches.   Physical Exam   Blood pressure 137/74, pulse 93, temperature 98.2 F (36.8 C), temperature source Oral, resp. rate 18, height 5\' 5"  (1.651 m), weight 130.2 kg, SpO2 100 %, unknown if currently breastfeeding.  Patient Vitals for the past 24 hrs:  BP Temp Temp src Pulse Resp SpO2 Height Weight  01/01/21 1253 137/74 98.2 F (36.8 C) Oral 93 18 100 % -- --  01/01/21 1248 -- -- -- -- -- -- 5\' 5"  (1.651 m) 130.2 kg   Physical Exam Vitals and nursing note reviewed.  Constitutional:      General: She is not  in acute distress.    Appearance: Normal appearance. She is not ill-appearing, toxic-appearing or diaphoretic.  HENT:     Head: Normocephalic and atraumatic.  Pulmonary:     Effort: Pulmonary effort is normal.  Neurological:     Mental Status: She is alert and oriented to person, place, and time.  Psychiatric:        Mood and Affect: Mood normal.        Behavior: Behavior normal.        Thought Content: Thought content normal.        Judgment: Judgment normal.    Results for orders placed or performed during the hospital encounter of 01/01/21 (from the past 24 hour(s))  Pregnancy, urine POC     Status: Abnormal   Collection Time: 01/01/21 12:32 PM  Result Value Ref Range   Preg Test, Ur POSITIVE (A) NEGATIVE  Urinalysis, Routine w reflex microscopic Urine, Clean Catch     Status: Abnormal   Collection Time: 01/01/21 12:49 PM  Result Value Ref Range   Color, Urine YELLOW YELLOW   APPearance CLOUDY (A) CLEAR   Specific Gravity, Urine 1.021 1.005 - 1.030   pH 8.0 5.0 - 8.0   Glucose, UA NEGATIVE NEGATIVE mg/dL   Hgb urine dipstick NEGATIVE NEGATIVE   Bilirubin Urine NEGATIVE NEGATIVE   Ketones, ur NEGATIVE NEGATIVE mg/dL   Protein, ur NEGATIVE NEGATIVE mg/dL   Nitrite NEGATIVE NEGATIVE   Leukocytes,Ua NEGATIVE NEGATIVE  CBC     Status: Abnormal   Collection Time: 01/01/21  1:23 PM  Result Value Ref Range   WBC 9.1 4.0 - 10.5 K/uL   RBC 4.97 3.87 - 5.11 MIL/uL   Hemoglobin 12.6 12.0 - 15.0 g/dL   HCT 03/03/21 03/03/21 - 43.1 %   MCV 78.7 (L) 80.0 - 100.0 fL   MCH 25.4 (L) 26.0 - 34.0 pg   MCHC 32.2 30.0 - 36.0 g/dL   RDW 54.0 08.6 - 76.1 %   Platelets 242 150 - 400 K/uL   nRBC 0.0 0.0 - 0.2 %  Comprehensive metabolic panel     Status: None   Collection Time: 01/01/21  1:23 PM  Result Value Ref Range   Sodium 136 135 - 145 mmol/L   Potassium 3.9 3.5 - 5.1 mmol/L   Chloride 105 98 - 111 mmol/L   CO2 25 22 - 32 mmol/L   Glucose, Bld 99 70 - 99 mg/dL   BUN 9 6 - 20  mg/dL    Creatinine, Ser 5.91 0.44 - 1.00 mg/dL   Calcium 9.0 8.9 - 63.8 mg/dL   Total Protein 6.5 6.5 - 8.1 g/dL   Albumin 3.8 3.5 - 5.0 g/dL   AST 29 15 - 41 U/L   ALT 35 0 - 44 U/L   Alkaline Phosphatase 38 38 - 126 U/L   Total Bilirubin 0.7 0.3 - 1.2 mg/dL   GFR, Estimated >46 >65 mL/min   Anion gap 6 5 - 15  hCG, quantitative, pregnancy     Status: Abnormal   Collection Time: 01/01/21  1:23 PM  Result Value Ref Range   hCG, Beta Chain, Quant, S 207 (H) <5 mIU/mL  Wet prep, genital     Status: Abnormal   Collection Time: 01/01/21  1:36 PM  Result Value Ref Range   Yeast Wet Prep HPF POC NONE SEEN NONE SEEN   Trich, Wet Prep NONE SEEN NONE SEEN   Clue Cells Wet Prep HPF POC NONE SEEN NONE SEEN   WBC, Wet Prep HPF POC FEW (A) NONE SEEN   Sperm NONE SEEN    US OB LESS THAN 14 WEEKS WITH OB TRANSVAGINAL  Result Date: 01/01/2021 CLINICAL DATA:  Abdominal cramping on LEFT in first trimester of pregnancy, unknown LMP, irregular cycles, quantitative beta HCG 207 EXAM: OBSTETRIC <14 WK [place] AND TRANSVAGINAL OB US TECHNIQUE: Both transabdominal and transvaginal ultrasound examinations were performed for complete evaluation of the gestation as well as the maternal uterus, adnexal regions, and pelvic cul-de-sac. Transvaginal technique was performed to assess early pregnancy. COMPARISON:  None FINDINGS: Intrauterine gestational sac: Absent Yolk sac:  N/A Embryo:  N/A Cardiac Activity: N/A Heart Rate: N/A  bpm MSD:   mm    w     d CRL:    mm    w    d                  [place] EDC: Subchorionic hemorrhage:  N/A Maternal uterus/adnexae: Uterus anteverted, normal in appearance. No uterine mass identified. Endometrial complex normal in thickness, 11 mm thick without mass, fluid or gestational sac. LEFT ovary normal size and morphology, 2.4 x 3.1 x 1.8 cm. RIGHT ovary normal size and morphology, 1.7 x 3.0 x 2.5 cm. Small RIGHT paraovarian cyst noted, 15 mm diameter with small amount of adjacent fluid. No other adnexal  masses or free pelvic fluid. IMPRESSION: No intrauterine gestation identified. Findings are consistent with pregnancy of unknown location. Differential diagnosis includes early intrauterine pregnancy too early to visualize, spontaneous abortion, and ectopic pregnancy. Serial quantitative beta HCG and or follow-up ultrasound recommended to definitively exclude ectopic pregnancy. Small RIGHT paraovarian cyst; no follow-up imaging recommended Electronically Signed   By: Ulyses Southward M.D.   On: 01/01/2021 16:55   MAU Course  Procedures  MDM -r/o ectopic -UA: cloudy -CBC: WNL -CMP: WNL -[place]: PUL -hCG: 207 -ABO: O NEGATIVE -WetPrep: WNL -GC/CT collected Discussed with client the diagnosis of pregnancy of unknown anatomic location.  Three possibilities of outcome are: a healthy pregnancy that is too early to see a yolk sac to confirm the pregnancy is in the uterus, a pregnancy that is not healthy and has not developed and will not develop, and an ectopic pregnancy that is in the abdomen that cannot be identified at this time.  And ectopic pregnancy can be a life threatening situation as a pregnancy needs to be in the uterus which is a muscle and can stretch to accommodate the  growth of a pregnancy.  Other structures in the pelvis and abdomen as not muscular and do not stretch with the growth of a pregnancy.  Worst case scenario is that a structure ruptures with a growing pregnancy not in the uterus and and internal hemorrhage can be a life threatening situation.  We need to follow the progression of this pregnancy carefully.  We need to check another serum pregnancy hormone level to determine if the levels are rising appropriately  and to determine the next steps that are needed for you. Patient's questions were answered. -pt discharged to home in stable condition  Orders Placed This Encounter  Procedures  . Wet prep, genital    Standing Status:   Standing    Number of Occurrences:   1  . US OB LESS  THAN 14 WEEKS WITH OB TRANSVAGINAL    Standing Status:   Standing    Number of Occurrences:   1    Order Specific Question:   Symptom/Reason for Exam    Answer:   Pelvic cramping [341937]  . Urinalysis, Routine w reflex microscopic Urine, Clean Catch    Standing Status:   Standing    Number of Occurrences:   1  . CBC    Standing Status:   Standing    Number of Occurrences:   1  . Comprehensive metabolic panel    Standing Status:   Standing    Number of Occurrences:   1  . hCG, quantitative, pregnancy    Standing Status:   Standing    Number of Occurrences:   1  . Pregnancy, urine POC    Standing Status:   Standing    Number of Occurrences:   1  . Discharge patient    Order Specific Question:   Discharge disposition    Answer:   01-Home or Self Care [1]    Order Specific Question:   Discharge patient date    Answer:   01/01/2021   No orders of the defined types were placed in this encounter.  Assessment and Plan   1. Pregnancy of unknown anatomic location   2. Pelvic cramping   3. Rh negative state in antepartum period    Allergies as of 01/01/2021   No Known Allergies     Medication List    TAKE these medications   acetaminophen 500 MG tablet Commonly known as: TYLENOL Take 1,000 mg by mouth every 6 (six) hours as needed for mild pain, moderate pain or headache.   bismuth subsalicylate 262 MG/15ML suspension Commonly known as: PEPTO BISMOL Take 30 mLs by mouth every 6 (six) hours as needed for indigestion or diarrhea or loose stools.   calcium carbonate 500 MG chewable tablet Commonly known as: TUMS - dosed in mg elemental calcium Chew 1 tablet by mouth daily as needed for indigestion or heartburn.   ondansetron 8 MG disintegrating tablet Commonly known as: Zofran ODT 8mg  ODT q4 hours prn nausea   oxyCODONE 5 MG immediate release tablet Commonly known as: Roxicodone Take 1 tablet (5 mg total) by mouth every 6 (six) hours as needed for severe pain.      -will  call with culture results, if positive -safe meds in pregnancy list given -list of OB providers given -discussed ectopic vs. First trimester vs. miscarriage -strict ectopic precautions given -return MAU precautions -f/u on 01/02/2021 at MAU for repeat hCG -pt discharged to home in stable condition  03/04/2021 E Denielle Bayard 01/01/2021, 5:48 PM

## 2021-01-01 NOTE — Discharge Instructions (Signed)
Ectopic Pregnancy  An ectopic pregnancy happens when a fertilized egg attaches (implants) outside the uterus. In a normal pregnancy, a fertilized egg implants in the uterus. An ectopic pregnancy cannot develop into a healthy baby. Most ectopic pregnancies occur in one of the fallopian tubes, which is where an egg travels from an ovary to get to the uterus. This is called a tubal pregnancy. An ectopic pregnancy can also happen on an ovary, on the cervix, or in the abdomen. When a fertilized egg implants on tissue outside the uterus and begins to grow, it may cause the tissue to tear or burst. This is known as a ruptured ectopic pregnancy. The tear or burst causes internal bleeding. This may cause intense pain in the abdomen. An ectopic pregnancy is a medical emergency and can be life-threatening. What are the causes? The most common cause of this condition is damage to one of the fallopian tubes. A fallopian tube may be narrowed or blocked, and that stops the fertilized egg from reaching the uterus. Sometimes, the cause of this condition is not known. What increases the risk? The following factors may make you more likely to develop this condition:  Having gone through infertility treatment before.  Having had an ectopic pregnancy before.  Having had surgery to have the fallopian tubes tied.  Becoming pregnant while using an intrauterine device for birth control.  Taking birth control pills before the age of 16. Other risk factors include:  Smoking.  Alcohol use.  History of DES exposure. DES is a medicine that was used until 1971 and affected babies whose mothers took the medicine. What are the signs or symptoms? Common symptoms of this condition include:  Missing a menstrual period.  Nausea or tiredness.  Tender breasts.  Other normal pregnancy symptoms. Other symptoms may include:  Pain during sex.  Vaginal bleeding or spotting.  Cramping or pain in the lower abdomen.  A  fast heartbeat, low blood pressure, and sweating.  Pain or increased pressure while having a bowel movement. Symptoms of a ruptured ectopic pregnancy and internal bleeding may include:  Sudden, severe pain in the abdomen.  Dizziness, weakness, feeling light-headed, or fainting.  Pain in the shoulder or neck area. How is this diagnosed? This condition is diagnosed by:  A blood test to check for the pregnancy hormone.  A pelvic exam to find painful areas or a mass in the abdomen.  Ultrasound. A probe is inserted into the vagina to see if there is a pregnancy in or outside the uterus.  Taking a sample of tissue from the uterus.  Surgery to look closely at the fallopian tubes through an incision in the abdomen. How is this treated? This condition is usually treated with medicine or surgery. Sometimes, ectopic pregnancies can resolve on their own, under close monitoring by your health care provider. Medicine A medicine called methotrexate may be given to cause the pregnancy tissue to be absorbed. The medicine may be given if:  The diagnosis is made early, with no signs of active bleeding.  The fallopian tube has not torn or burst. You will need blood tests to make sure the medicine is working. It may take 4-6 weeks for the pregnancy tissues to be absorbed. Surgery Surgery may be performed to:  Remove the pregnancy tissue.  Stop internal bleeding.  Remove part or all of the fallopian tube.  Remove the uterus. This is rare. After surgery, you may need to have blood tests to make sure the surgery worked.   Follow these instructions at home: Medicines  Take over-the-counter and prescription medicines only as told by your health care provider.  Ask your health care provider if the medicine prescribed to you: ? Requires you to avoid driving or using machinery. ? Can cause constipation. You may need to take these actions to prevent or treat constipation:  Drink enough fluid to  keep your urine pale yellow.  Take over-the-counter or prescription medicines.  Eat foods that are high in fiber, such as beans, whole grains, and fresh fruits and vegetables.  Limit foods that are high in fat and processed sugars, such as fried or sweet foods. General instructions  Rest or limit your activity, if told by your health care provider.  Do not have sex or put anything in your vagina, such as tampons or douches, for 6 weeks or until your health care provider says it is safe.  Do not lift anything that is heavier than 10 lb (4.5 kg), or the limit that you are told, until your health care provider says that it is safe.  Return to your normal activities as told by your health care provider. Ask your health care provider what activities are safe for you.  Keep all follow-up visits. This is important. Contact a health care provider if:  You have a fever or chills.  You have nausea and vomiting. Get help right away if:  Your pain gets worse or is not relieved by medicine.  You feel dizzy or weak.  You feel light-headed or you faint.  You have sudden, severe pain in your abdomen.  You have sudden pain in the shoulder or neck area. Summary  An ectopic pregnancy happens when a fertilized egg implants outside the uterus. Most ectopic pregnancies occur in one of the fallopian tubes.  An ectopic pregnancy is a medical emergency and can be life-threatening.  The most common cause of this condition is damage to one of the fallopian tubes.  This condition is usually treated with medicine or surgery. Some ectopic pregnancies resolve on their own, under close monitoring by your health care provider. This information is not intended to replace advice given to you by your health care provider. Make sure you discuss any questions you have with your health care provider. Document Revised: 11/26/2019 Document Reviewed: 11/26/2019 Elsevier Patient Education  2021 Elsevier  Inc.        Miscarriage A miscarriage is the loss of pregnancy before the 20th week. Most miscarriages happen during the first 3 months of pregnancy. Sometimes, a miscarriage can happen before a woman knows that she is pregnant. Having a miscarriage can be an emotional experience. If you have had a miscarriage, talk with your health care provider about any questions you may have about the loss of your baby, the grieving process, and your plans for future pregnancy. What are the causes? Many times, the cause of a miscarriage is not known. What increases the risk? The following factors may make a pregnant woman more likely to have a miscarriage: Certain medical conditions  Conditions that affect the hormone balance in the body, such as thyroid disease or polycystic ovary syndrome.  Diabetes.  Autoimmune disorders.  Infections.  Bleeding disorders.  Obesity. Lifestyle factors  Using products with tobacco or nicotine in them or being exposed to tobacco smoke.  Having alcohol.  Having large amounts of caffeine.  Recreational drug use. Problems with reproductive organs or structures  Cervical insufficiency. This is when the lowest part of the uterus (cervix)  opens and thins before pregnancy is at term.  Having a condition called Asherman syndrome. This syndrome causes scarring in the uterus or causes the uterus to be abnormal in structure.  Fibrous growths, called fibroids, in the uterus.  Congenital abnormalities. These problems are present at birth.  Infection of the cervix or uterus. Personal or medical history  Injury (trauma).  Having had a miscarriage before.  Being younger than age 67 or older than age 64.  Exposure to harmful substances in the environment. This may include radiation or heavy metals, such as lead.  Use of certain medicines. What are the signs or symptoms? Symptoms of this condition include:  Vaginal bleeding or spotting, with or without  cramps or pain.  Pain or cramping in the abdomen or lower back.  Fluid or tissue coming out of the vagina. How is this diagnosed? This condition may be diagnosed based on:  A physical exam.  Ultrasound.  Lab tests, such as blood tests, urine tests, or swabs for infection. How is this treated? Treatment for a miscarriage is sometimes not needed if all the pregnancy tissue that was in the uterus comes out on its own, and there are no other problems such as infection or heavy bleeding. In other cases, this condition may be treated with:  Dilation and curettage (D&C). In this procedure, the cervix is stretched open and any remaining pregnancy tissue is removed from the lining of the uterus (endometrium).  Medicines. These may include: ? Antibiotic medicine, to treat infection. ? Medicine to help any remaining pregnancy tissue come out of the body. ? Medicine to reduce (contract) the size of the uterus. These medicines may be given if there is a lot of bleeding. If you have Rh-negative blood, you may be given an injection of a medicine called Rho(D) immune globulin. This medicine helps prevent problems with future pregnancies. Follow these instructions at home: Medicines  Take over-the-counter and prescription medicines only as told by your health care provider.  If you were prescribed antibiotic medicine, take it as told by your health care provider. Do not stop taking the antibiotic even if you start to feel better. Activity  Rest as told by your health care provider. Ask your health care provider what activities are safe for you.  Have someone help with home and family responsibilities during this time. General instructions  Monitor how much tissue or blood clot material comes out of the vagina.  Do not have sex, douche, or put anything, such as tampons, in your vagina until your health care provider says it is okay.  To help you and your partner with the grieving process, talk  with your health care provider or get counseling.  When you are ready, meet with your health care provider to discuss any important steps you should take for your health. Also, discuss steps you should take to have a healthy pregnancy in the future.  Keep all follow-up visits. This is important.   Where to find more information  The [employer] of Obstetricians and Gynecologists: acog.org  [place] Department of Health and Cytogeneticist of Women's Health: http://hoffman.com/ Contact a health care provider if:  You have a fever or chills.  There is bad-smelling fluid coming from the vagina.  You have more bleeding instead of less.  Tissue or blood clots come out of your vagina. Get help right away if:  You have severe cramps or pain in your back or abdomen.  Heavy bleeding soaks through 2  large sanitary pads an hour for more than 2 hours.  You become light-headed or weak.  You faint.  You feel sad, and your sadness takes over your thoughts.  You think about hurting yourself. If you ever feel like you may hurt yourself or others, or have thoughts about taking your own life, get help right away. Go to your nearest emergency department or:  Call your local emergency services (911 in the [place]  Call a suicide crisis helpline, such as the National Suicide Prevention Lifeline at (647)536-9323. This is open 24 hours a day in the [place]  Text the Crisis Text Line at (770)275-3669 (in the [place] Summary  Most miscarriages happen in the first 3 months of pregnancy. Sometimes miscarriage happens before a woman knows that she is pregnant.  Follow instructions from your health care provider about medicines and activity.  To help you and your partner with grieving, talk with your health care provider or get counseling.  Keep all follow-up visits. This information is not intended to replace advice given to you by your health care provider. Make sure you discuss any  questions you have with your health care provider. Document Revised: 02/14/2020 Document Reviewed: 02/14/2020 Elsevier Patient Education  2021 Elsevier Inc.        Obstetrics: Normal and Problem Pregnancies (7th ed., pp. 102-121). [place], [place]: Elsevier."> Textbook of Family Medicine (9th ed., pp. [phone]). [place], [place]: Elsevier [employer].">  First Trimester of Pregnancy  The first trimester of pregnancy starts on the first day of your last menstrual period until the end of week 12. This is months 1 through 3 of pregnancy. A week after a sperm fertilizes an egg, the egg will implant into the wall of the uterus and begin to develop into a baby. By the end of 12 weeks, all the baby's organs will be formed and the baby will be 2-3 inches in size. Body changes during your first trimester Your body goes through many changes during pregnancy. The changes vary and generally return to normal after your baby is born. Physical changes  You may gain or lose weight.  Your breasts may begin to grow larger and become tender. The tissue that surrounds your nipples (areola) may become darker.  Dark spots or blotches (chloasma or mask of pregnancy) may develop on your face.  You may have changes in your hair. These can include thickening or thinning of your hair or changes in texture. Health changes  You may feel nauseous, and you may vomit.  You may have heartburn.  You may develop headaches.  You may develop constipation.  Your gums may bleed and may be sensitive to brushing and flossing. Other changes  You may tire easily.  You may urinate more often.  Your menstrual periods will stop.  You may have a loss of appetite.  You may develop cravings for certain kinds of food.  You may have changes in your emotions from day to day.  You may have more vivid and strange dreams. Follow these instructions at home: Medicines  Follow your health care provider's instructions  regarding medicine use. Specific medicines may be either safe or unsafe to take during pregnancy. Do not take any medicines unless told to by your health care provider.  Take a prenatal vitamin that contains at least 600 micrograms (mcg) of folic acid. Eating and drinking  Eat a healthy diet that includes fresh fruits and vegetables, whole grains, good sources of protein such as meat, eggs, or tofu, and  low-fat dairy products.  Avoid raw meat and unpasteurized juice, milk, and cheese. These carry germs that can harm you and your baby.  If you feel nauseous or you vomit: ? Eat 4 or 5 small meals a day instead of 3 large meals. ? Try eating a few soda crackers. ? Drink liquids between meals instead of during meals.  You may need to take these actions to prevent or treat constipation: ? Drink enough fluid to keep your urine pale yellow. ? Eat foods that are high in fiber, such as beans, whole grains, and fresh fruits and vegetables. ? Limit foods that are high in fat and processed sugars, such as fried or sweet foods. Activity  Exercise only as directed by your health care provider. Most people can continue their usual exercise routine during pregnancy. Try to exercise for 30 minutes at least 5 days a week.  Stop exercising if you develop pain or cramping in the lower abdomen or lower back.  Avoid exercising if it is very hot or humid or if you are at high altitude.  Avoid heavy lifting.  If you choose to, you may have sex unless your health care provider tells you not to. Relieving pain and discomfort  Wear a good support bra to relieve breast tenderness.  Rest with your legs elevated if you have leg cramps or low back pain.  If you develop bulging veins (varicose veins) in your legs: ? Wear support hose as told by your health care provider. ? Elevate your feet for 15 minutes, 3-4 times a day. ? Limit salt in your diet. Safety  Wear your seat belt at all times when driving or  riding in a car.  Talk with your health care provider if someone is verbally or physically abusive to you.  Talk with your health care provider if you are feeling sad or have thoughts of hurting yourself. Lifestyle  Do not use hot tubs, steam rooms, or saunas.  Do not douche. Do not use tampons or scented sanitary pads.  Do not use herbal remedies, alcohol, illegal drugs, or medicines that are not approved by your health care provider. Chemicals in these products can harm your baby.  Do not use any products that contain nicotine or tobacco, such as cigarettes, e-cigarettes, and chewing tobacco. If you need help quitting, ask your health care provider.  Avoid cat litter boxes and soil used by cats. These carry germs that can cause birth defects in the baby and possibly loss of the unborn baby (fetus) by miscarriage or stillbirth. General instructions  During routine prenatal visits in the first trimester, your health care provider will do a physical exam, perform necessary tests, and ask you how things are going. Keep all follow-up visits. This is important.  Ask for help if you have counseling or nutritional needs during pregnancy. Your health care provider can offer advice or refer you to specialists for help with various needs.  Schedule a dentist appointment. At home, brush your teeth with a soft toothbrush. Floss gently.  Write down your questions. Take them to your prenatal visits. Where to find more information  American Pregnancy Association: americanpregnancy.org  [employer] of [employer]stetricians and Gynecologists: https://www.todd-brady.net/  Office on [employer] Health: MightyReward.co.nz Contact a health care provider if you have:  Dizziness.  A fever.  Mild pelvic cramps, pelvic pressure, or nagging pain in the abdominal area.  Nausea, vomiting, or diarrhea that lasts for 24 hours or longer.  A bad-smelling vaginal discharge.  Pain when you  urinate.  Known exposure to a contagious illness, such as chickenpox, measles, Zika virus, HIV, or hepatitis. Get help right away if you have:  Spotting or bleeding from your vagina.  Severe abdominal cramping or pain.  Shortness of breath or chest pain.  Any kind of trauma, such as from a fall or a car crash.  New or increased pain, swelling, or redness in an arm or leg. Summary  The first trimester of pregnancy starts on the first day of your last menstrual period until the end of week 12 (months 1 through 3).  Eating 4 or 5 small meals a day rather than 3 large meals may help to relieve nausea and vomiting.  Do not use any products that contain nicotine or tobacco, such as cigarettes, e-cigarettes, and chewing tobacco. If you need help quitting, ask your health care provider.  Keep all follow-up visits. This is important. This information is not intended to replace advice given to you by your health care provider. Make sure you discuss any questions you have with your health care provider. Document Revised: 01/22/2020 Document Reviewed: 11/28/2019 Elsevier Patient Education  2021 Elsevier Inc.                        Safe Medications in Pregnancy    Acne: Benzoyl Peroxide Salicylic Acid  Backache/Headache: Tylenol: 2 regular strength every 4 hours OR              2 Extra strength every 6 hours  Colds/Coughs/Allergies: Benadryl (alcohol free) 25 mg every 6 hours as needed Breath right strips Claritin Cepacol throat lozenges Chloraseptic throat spray Cold-Eeze- up to three times per day Cough drops, alcohol free Flonase (by prescription only) Guaifenesin Mucinex Robitussin DM (plain only, alcohol free) Saline nasal spray/drops Sudafed (pseudoephedrine) & Actifed ** use only after [redacted] weeks gestation and if you do not have high blood pressure Tylenol Vicks Vaporub Zinc lozenges Zyrtec   Constipation: Colace Ducolax suppositories Fleet enema Glycerin  suppositories Metamucil Milk of magnesia Miralax Senokot Smooth move tea  Diarrhea: Kaopectate Imodium A-D  *NO pepto Bismol  Hemorrhoids: Anusol Anusol HC Preparation H Tucks  Indigestion: Tums Maalox Mylanta Zantac  Pepcid  Insomnia: Benadryl (alcohol free) 25mg  every 6 hours as needed Tylenol PM Unisom, no Gelcaps  Leg Cramps: Tums MagGel  Nausea/Vomiting:  Bonine Dramamine Emetrol Ginger extract Sea bands Meclizine  Nausea medication to take during pregnancy:  Unisom (doxylamine succinate 25 mg tablets) Take one tablet daily at bedtime. If symptoms are not adequately controlled, the dose can be increased to a maximum recommended dose of two tablets daily (1/2 tablet in the morning, 1/2 tablet mid-afternoon and one at bedtime). Vitamin B6 100mg  tablets. Take one tablet twice a day (up to 200 mg per day).  Skin Rashes: Aveeno products Benadryl cream or 25mg  every 6 hours as needed Calamine Lotion 1% cortisone cream  Yeast infection: Gyne-lotrimin 7 Monistat 7   **If taking multiple medications, please check labels to avoid duplicating the same active ingredients **take medication as directed on the label ** Do not exceed 4000 mg of tylenol in 24 hours **Do not take medications that contain aspirin or ibuprofen          Prenatal Care Providers           [facility] @ [facility] - accepts patients without insurance  Phone: [phone]  Center for @ [facility]  Phone: [phone]  [facility] @[place]       Phone: [phone]            [facility] for [facility] @ [place]     Phone: [phone]          [facility] for [facility] @ [facility]   Phone: [phone]  [facility] for [facility] @ [facility] - accepts patients without insurance  Phone: [phone]  [facility] for [facility] @ [facility] Phone: [phone]     [facility] Department -  accepts patients without insurance Phone: [phone]  [facility]  Phone: [phone]  [phone] OB/GYN Phone: [phone]  [facility] Phone: [phone]  [facility] Phone: [phone]  [facility] Phone: [phone]  [facility]  Phone: [phone]

## 2021-01-01 NOTE — MAU Note (Signed)
Presents with c/o lower abdominal cramping, more om left side.  Denies VB.  LMP unsure, irregular cycles.  +HPT.

## 2021-01-03 ENCOUNTER — Other Ambulatory Visit: Payer: Self-pay

## 2021-01-03 ENCOUNTER — Inpatient Hospital Stay (HOSPITAL_COMMUNITY)
Admission: AD | Admit: 2021-01-03 | Discharge: 2021-01-03 | Disposition: A | Discharge status: Home / Self Care | Payer: Medicaid Other | Attending: Obstetrics & Gynecology | Admitting: Obstetrics & Gynecology

## 2021-01-03 DIAGNOSIS — O3680X0 Pregnancy with inconclusive fetal viability, not applicable or unspecified: Secondary | ICD-10-CM | POA: Diagnosis not present

## 2021-01-03 DIAGNOSIS — Z3A01 Less than 8 weeks gestation of pregnancy: Secondary | ICD-10-CM | POA: Diagnosis not present

## 2021-01-03 LAB — HCG, QUANTITATIVE, PREGNANCY: hCG, Beta Chain, Quant, S: 196 m[IU]/mL — ABNORMAL HIGH (ref <5)

## 2021-01-03 NOTE — MAU Provider Note (Signed)
History   Chief Complaint:  Follow-up   Carly Rush is  30 y.o. [id number] No LMP recorded (lmp unknown). Patient is pregnant.. Patient is here for follow up of quantitative HCG and ongoing surveillance of pregnancy status. She is Unknown weeks gestation due to unsure LMP  Since her last visit, the patient is without new complaint. The patient reports bleeding as  none now.  She denies any pain.  General ROS:  negative  Her previous Quantitative HCG values are: Results for RHODIA, ACRES (MRN [id number]) as of 01/03/2021 19:29  Ref. Range 01/01/2021 13:23  HCG, Beta Chain, Quant, S Latest Ref Range: <5 mIU/mL 207 (H)    Physical Exam   Blood pressure (!) 152/77, pulse 89, temperature (!) 97.5 F (36.4 C), temperature source Oral, resp. rate 17, SpO2 99 %, unknown if currently breastfeeding.  Physical Exam Vitals and nursing note reviewed.  Constitutional:      General: She is not in acute distress.    Appearance: Normal appearance. She is not ill-appearing.  Cardiovascular:     Rate and Rhythm: Normal rate.  Pulmonary:     Effort: Pulmonary effort is normal. No respiratory distress.  Neurological:     Mental Status: She is alert.  Psychiatric:        Mood and Affect: Mood normal.        Behavior: Behavior normal.        Thought Content: Thought content normal.        Judgment: Judgment normal.     Labs: Results for orders placed or performed during the hospital encounter of 01/03/21 (from the past 24 hour(s))  hCG, quantitative, pregnancy   Collection Time: 01/03/21  5:01 PM  Result Value Ref Range   hCG, Beta Chain, Quant, S 196 (H) <5 mIU/mL    Assessment:   1. Pregnancy of unknown anatomic location   2. [redacted] weeks gestation of pregnancy     Consulted with Dr. Macon Large- due to physically stable presentation, recommends repeat HCG on 5/10 with likely MTX administration if HCG plateaus.  Discussed with client the diagnosis of pregnancy of unknown anatomic location.  Three  possibilities of outcome are: a healthy pregnancy that is too early to see a yolk sac to confirm the pregnancy is in the uterus, a pregnancy that is not healthy and has not developed and will not develop, and an ectopic pregnancy that is in the abdomen that cannot be identified at this time.  And ectopic pregnancy can be a life threatening situation as a pregnancy needs to be in the uterus which is a muscle and can stretch to accommodate the growth of a pregnancy.  Other structures in the pelvis and abdomen as not muscular and do not stretch with the growth of a pregnancy.  Worst case scenario is that a structure ruptures with a growing pregnancy not in the uterus and and internal hemorrhage can be a life threatening situation.  We need to follow the progression of this pregnancy carefully.  We need to check another serum pregnancy hormone level to determine if the levels are rising appropriately  and to determine the next steps that are needed for you. Patient's questions were answered.   Plan: -Discharge home in stable condition -Strict ectopic precautions discussed -Patient advised to follow-up with MAU on 5/10 for repeat bloodwork, orders placed -Patient may return to MAU as needed or if her condition were to change or worsen  Rolm Bookbinder, CNM 01/03/2021, 7:27 PM

## 2021-01-03 NOTE — MAU Note (Signed)
Pt reports to mau for follow up blood work.  Denies bleeding, reports pain is about the same as 2 days ago when she was in mau.

## 2021-01-04 LAB — GC/CHLAMYDIA PROBE AMP (~~LOC~~) NOT AT ARMC
Chlamydia: NEGATIVE
Comment: NEGATIVE
Comment: NORMAL
Neisseria Gonorrhea: NEGATIVE

## 2021-01-05 ENCOUNTER — Other Ambulatory Visit: Payer: Self-pay

## 2021-01-05 ENCOUNTER — Inpatient Hospital Stay (HOSPITAL_COMMUNITY): Payer: Medicaid Other

## 2021-01-05 ENCOUNTER — Inpatient Hospital Stay (HOSPITAL_COMMUNITY)
Admission: AD | Admit: 2021-01-05 | Discharge: 2021-01-06 | Disposition: A | Discharge status: Home / Self Care | Payer: Medicaid Other | Attending: Obstetrics and Gynecology | Admitting: Obstetrics and Gynecology

## 2021-01-05 DIAGNOSIS — Z87891 Personal history of nicotine dependence: Secondary | ICD-10-CM | POA: Diagnosis not present

## 2021-01-05 DIAGNOSIS — Z3A00 Weeks of gestation of pregnancy not specified: Secondary | ICD-10-CM | POA: Insufficient documentation

## 2021-01-05 DIAGNOSIS — O3680X0 Pregnancy with inconclusive fetal viability, not applicable or unspecified: Secondary | ICD-10-CM | POA: Insufficient documentation

## 2021-01-05 DIAGNOSIS — O26899 Other specified pregnancy related conditions, unspecified trimester: Secondary | ICD-10-CM | POA: Diagnosis not present

## 2021-01-05 DIAGNOSIS — R1032 Left lower quadrant pain: Secondary | ICD-10-CM | POA: Diagnosis not present

## 2021-01-05 LAB — CBC
HCT: 39.6 % (ref 36.0–46.0)
Hemoglobin: 13.1 g/dL (ref 12.0–15.0)
MCH: 25.5 pg — ABNORMAL LOW (ref 26.0–34.0)
MCHC: 33.1 g/dL (ref 30.0–36.0)
MCV: 77.2 fL — ABNORMAL LOW (ref 80.0–100.0)
Platelets: 254 10*3/uL (ref 150–400)
RBC: 5.13 MIL/uL — ABNORMAL HIGH (ref 3.87–5.11)
RDW: 15.1 % (ref 11.5–15.5)
WBC: 10.1 10*3/uL (ref 4.0–10.5)
nRBC: 0 % (ref 0.0–0.2)

## 2021-01-05 LAB — COMPREHENSIVE METABOLIC PANEL
ALT: 28 U/L (ref 0–44)
AST: 30 U/L (ref 15–41)
Albumin: 4 g/dL (ref 3.5–5.0)
Alkaline Phosphatase: 44 U/L (ref 38–126)
Anion gap: 7 (ref 5–15)
BUN: 8 mg/dL (ref 6–20)
CO2: 24 mmol/L (ref 22–32)
Calcium: 8.8 mg/dL — ABNORMAL LOW (ref 8.9–10.3)
Chloride: 103 mmol/L (ref 98–111)
Creatinine, Ser: 0.54 mg/dL (ref 0.44–1.00)
GFR, Estimated: >60 mL/min (ref >60)
Glucose, Bld: 93 mg/dL (ref 70–99)
Potassium: 4.2 mmol/L (ref 3.5–5.1)
Sodium: 134 mmol/L — ABNORMAL LOW (ref 135–145)
Total Bilirubin: 0.9 mg/dL (ref 0.3–1.2)
Total Protein: 7 g/dL (ref 6.5–8.1)

## 2021-01-05 LAB — HCG, QUANTITATIVE, PREGNANCY: hCG, Beta Chain, Quant, S: 143 m[IU]/mL — ABNORMAL HIGH (ref <5)

## 2021-01-05 MED ORDER — METHOTREXATE FOR ECTOPIC PREGNANCY
50.0000 mg/m2 | Freq: Once | INTRAMUSCULAR | Status: AC
  Administered 2021-01-06: 122 mg via INTRAMUSCULAR
  Filled 2021-01-05: qty 4.88

## 2021-01-05 NOTE — MAU Provider Note (Signed)
Chief Complaint: Labs Only   Event Date/Time   First Provider Initiated Contact with Patient 01/05/21 2226        SUBJECTIVE HPI: Carly Rush is a 30 y.o. [id number] at Unknown by LMP who presents to maternity admissions reporting needing followup bloodwork and [place].  Was seen on 01/01/21 for LLQ pelvic pain. HCG went from 207 to 196 two days later.   She denies vaginal bleeding, vaginal itching/burning, urinary symptoms, h/a, dizziness, n/v, or fever/chills.    Abdominal Pain This is a recurrent problem. The current episode started in the past 7 days. The problem occurs rarely. The problem has been resolved. The pain is located in the LLQ. Pertinent negatives include no anorexia, constipation, diarrhea, dysuria, fever, frequency or nausea. Nothing aggravates the pain. The pain is relieved by nothing. She has tried nothing for the symptoms.   RN Note: Here for repeat BHCG. Denies any pain or VB. Some nausea. Understands will have labs drawn and then provider will discuss results and plan of care. Pt knows not to leave while waiting for lab results.   Past Medical History:  Diagnosis Date  . Asthma    childhood  . Chlamydia 2011  . Gastric reflux   . GERD (gastroesophageal reflux disease)   . Gonorrhea 2011  . Headache(784.0)   . Hypertension   . Pregnancy induced hypertension   . Von Willebrand disease (HCC)    Past Surgical History:  Procedure Laterality Date  . DILATION AND CURETTAGE OF UTERUS    . INDUCED ABORTION    . TONSILLECTOMY    . WISDOM TOOTH EXTRACTION     Social History   Socioeconomic History  . Marital status: Single    Spouse name: Not on file  . Number of children: Not on file  . Years of education: Not on file  . Highest education level: Not on file  Occupational History  . Not on file  Tobacco Use  . Smoking status: Former Smoker    Years: 5.00    Types: Cigarettes    Quit date: 08/31/2014    Years since quitting: 6.3  . Smokeless tobacco: Never Used   Vaping Use  . Vaping Use: Never used  Substance and Sexual Activity  . Alcohol use: Not Currently    Comment: 3-4 drinks occasionally  . Drug use: No  . Sexual activity: Yes    Birth control/protection: None  Other Topics Concern  . Not on file  Social History Narrative  . Not on file   Social Determinants of Health   Financial Resource Strain: Not on file  Food Insecurity: Not on file  Transportation Needs: Not on file  Physical Activity: Not on file  Stress: Not on file  Social Connections: Not on file  Intimate Partner Violence: Not on file   No current facility-administered medications on file prior to encounter.   Current Outpatient Medications on File Prior to Encounter  Medication Sig Dispense Refill  . acetaminophen (TYLENOL) 500 MG tablet Take 1,000 mg by mouth every 6 (six) hours as needed for mild pain, moderate pain or headache.    . bismuth subsalicylate (PEPTO BISMOL) 262 MG/15ML suspension Take 30 mLs by mouth every 6 (six) hours as needed for indigestion or diarrhea or loose stools.    . calcium carbonate (TUMS - DOSED IN MG ELEMENTAL CALCIUM) 500 MG chewable tablet Chew 1 tablet by mouth daily as needed for indigestion or heartburn.    . ondansetron (ZOFRAN ODT) 8 MG disintegrating  tablet 8mg  ODT q4 hours prn nausea 4 tablet 0  . oxyCODONE (ROXICODONE) 5 MG immediate release tablet Take 1 tablet (5 mg total) by mouth every 6 (six) hours as needed for severe pain. 5 tablet 0   No Known Allergies  I have reviewed patient's Past Medical Hx, Surgical Hx, Family Hx, Social Hx, medications and allergies.   ROS:  Review of Systems  Constitutional: Negative for fever.  Gastrointestinal: Positive for abdominal pain. Negative for anorexia, constipation, diarrhea and nausea.  Genitourinary: Negative for dysuria and frequency.   Review of Systems  Other systems negative   Physical Exam  Physical Exam Patient Vitals for the past 24 hrs:  BP Temp Pulse Resp SpO2  Height Weight  01/05/21 2137 -- -- -- -- 100 % -- --  01/05/21 2136 (!) 134/94 -- 94 -- -- -- --  01/05/21 2135 -- (!) 97.4 F (36.3 C) -- 16 -- 5\' 5"  (1.651 m) 129.7 kg   Constitutional: Well-developed, well-nourished female in no acute distress.  Cardiovascular: normal rate Respiratory: normal effort GI: Abd soft, non-tender. Pos BS x 4 MS: Extremities nontender, no edema, normal ROM Neurologic: Alert and oriented x 4.  GU: Neg CVAT.  PELVIC EXAM: deferred, no pain now  LAB RESULTS Results for orders placed or performed during the hospital encounter of 01/05/21 (from the past 24 hour(s))  hCG, quantitative, pregnancy     Status: Abnormal   Collection Time: 01/05/21  9:42 PM  Result Value Ref Range   hCG, Beta Chain, Quant, S 143 (H) <5 mIU/mL  CBC     Status: Abnormal   Collection Time: 01/05/21  9:42 PM  Result Value Ref Range   WBC 10.1 4.0 - 10.5 K/uL   RBC 5.13 (H) 3.87 - 5.11 MIL/uL   Hemoglobin 13.1 12.0 - 15.0 g/dL   HCT 03/07/21 03/07/21 - 91.9 %   MCV 77.2 (L) 80.0 - 100.0 fL   MCH 25.5 (L) 26.0 - 34.0 pg   MCHC 33.1 30.0 - 36.0 g/dL   RDW 16.6 06.0 - 04.5 %   Platelets 254 150 - 400 K/uL   nRBC 0.0 0.0 - 0.2 %  Comprehensive metabolic panel     Status: Abnormal   Collection Time: 01/05/21  9:42 PM  Result Value Ref Range   Sodium 134 (L) 135 - 145 mmol/L   Potassium 4.2 3.5 - 5.1 mmol/L   Chloride 103 98 - 111 mmol/L   CO2 24 22 - 32 mmol/L   Glucose, Bld 93 70 - 99 mg/dL   BUN 8 6 - 20 mg/dL   Creatinine, Ser 74.1 0.44 - 1.00 mg/dL   Calcium 8.8 (L) 8.9 - 10.3 mg/dL   Total Protein 7.0 6.5 - 8.1 g/dL   Albumin 4.0 3.5 - 5.0 g/dL   AST 30 15 - 41 U/L   ALT 28 0 - 44 U/L   Alkaline Phosphatase 44 38 - 126 U/L   Total Bilirubin 0.9 0.3 - 1.2 mg/dL   GFR, Estimated 03/07/21 4.23 mL/min   Anion gap 7 5 - 15    IMAGING >95 OB Transvaginal  Result Date: 01/05/2021 CLINICAL DATA:  Follow-up pregnancy of unknown location EXAM: TRANSVAGINAL OB ULTRASOUND TECHNIQUE:  Transvaginal ultrasound was performed for complete evaluation of the gestation as well as the maternal uterus, adnexal regions, and pelvic cul-de-sac. COMPARISON:  01/01/2021 FINDINGS: Intrauterine gestational sac: None Yolk sac:  Not Visualized. Embryo:  Not Visualized. Maternal uterus/adnexae: Left ovary measures 3.4 x 2.3  x 2 cm. Right ovary measures 2.2 by 3.1 x 2.1 cm. Right paraovarian cyst measuring 2.2 x 1.4 x 1.5 cm. IMPRESSION: 1. No intrauterine pregnancy identified. Findings consistent with pregnancy of unknown location, differential of which includes recent failed pregnancy, occult ectopic, and IUP too early to visualize. 2. 2.2 cm right paraovarian cyst, no further follow-up recommended. Electronically Signed   By: Jasmine Pang M.D.   On: 01/05/2021 23:31   US OB LESS THAN 14 WEEKS WITH OB TRANSVAGINAL  Result Date: 01/01/2021 CLINICAL DATA:  Abdominal cramping on LEFT in first trimester of pregnancy, unknown LMP, irregular cycles, quantitative beta HCG 207 EXAM: OBSTETRIC <14 WK [place] AND TRANSVAGINAL OB US TECHNIQUE: Both transabdominal and transvaginal ultrasound examinations were performed for complete evaluation of the gestation as well as the maternal uterus, adnexal regions, and pelvic cul-de-sac. Transvaginal technique was performed to assess early pregnancy. COMPARISON:  None FINDINGS: Intrauterine gestational sac: Absent Yolk sac:  N/A Embryo:  N/A Cardiac Activity: N/A Heart Rate: N/A  bpm MSD:   mm    w     d CRL:    mm    w    d                  [place] EDC: Subchorionic hemorrhage:  N/A Maternal uterus/adnexae: Uterus anteverted, normal in appearance. No uterine mass identified. Endometrial complex normal in thickness, 11 mm thick without mass, fluid or gestational sac. LEFT ovary normal size and morphology, 2.4 x 3.1 x 1.8 cm. RIGHT ovary normal size and morphology, 1.7 x 3.0 x 2.5 cm. Small RIGHT paraovarian cyst noted, 15 mm diameter with small amount of adjacent fluid. No other adnexal  masses or free pelvic fluid. IMPRESSION: No intrauterine gestation identified. Findings are consistent with pregnancy of unknown location. Differential diagnosis includes early intrauterine pregnancy too early to visualize, spontaneous abortion, and ectopic pregnancy. Serial quantitative beta HCG and or follow-up ultrasound recommended to definitively exclude ectopic pregnancy. Small RIGHT paraovarian cyst; no follow-up imaging recommended Electronically Signed   By: Ulyses Southward M.D.   On: 01/01/2021 16:55     MAU Management/MDM: Ordered followup labs.  HCG has diminished Will check repeat Ultrasound to rule out ectopic.  This did not show any pregnancy.  Consult Dr Charlotta Newton with presentation, exam findings, and results.    This bleeding/pain can represent a normal pregnancy with bleeding, spontaneous abortion or even an ectopic which can be life-threatening.  The process as listed above helps to determine which of these is present.  Plan to give her Methotrexate and recheck HCG levels Day 4 and 7.   ASSESSMENT 1. Pregnancy of unknown anatomic location   2.. Decreasing Quant HCG levels.   PLAN Discharge home RTO MAU Saturday for Day 4 labs, then to office for Day 7 labs Pt stable at time of discharge. Encouraged to return here if she develops worsening of symptoms, increase in pain, fever, or other concerning symptoms.    Wynelle Bourgeois CNM, MSN Certified Nurse-Midwife 01/05/2021  10:26 PM

## 2021-01-05 NOTE — MAU Note (Signed)
Wynelle Bourgeois CNM in Triage to discuss test results and plan of care with pt

## 2021-01-05 NOTE — MAU Provider Note (Incomplete)
Chief Complaint: Labs Only   Event Date/Time   First Provider Initiated Contact with Patient 01/05/21 2226        SUBJECTIVE HPI: Carly Rush is a 30 y.o. [id number] at Unknown by LMP who presents to maternity admissions reporting needing followup bloodwork and [place].  Was seen on 01/01/21 for LLQ pelvic pain. HCG went from 207 to 196 two days later.   She denies vaginal bleeding, vaginal itching/burning, urinary symptoms, h/a, dizziness, n/v, or fever/chills.    Abdominal Pain This is a recurrent problem. The current episode started in the past 7 days. The problem occurs rarely. The problem has been resolved. The pain is located in the LLQ. Pertinent negatives include no anorexia, constipation, diarrhea, dysuria, fever, frequency or nausea. Nothing aggravates the pain. The pain is relieved by nothing. She has tried nothing for the symptoms.   RN Note: Here for repeat BHCG. Denies any pain or VB. Some nausea. Understands will have labs drawn and then provider will discuss results and plan of care. Pt knows not to leave while waiting for lab results.   Past Medical History:  Diagnosis Date  . Asthma    childhood  . Chlamydia 2011  . Gastric reflux   . GERD (gastroesophageal reflux disease)   . Gonorrhea 2011  . Headache(784.0)   . Hypertension   . Pregnancy induced hypertension   . Von Willebrand disease (HCC)    Past Surgical History:  Procedure Laterality Date  . DILATION AND CURETTAGE OF UTERUS    . INDUCED ABORTION    . TONSILLECTOMY    . WISDOM TOOTH EXTRACTION     Social History   Socioeconomic History  . Marital status: Single    Spouse name: Not on file  . Number of children: Not on file  . Years of education: Not on file  . Highest education level: Not on file  Occupational History  . Not on file  Tobacco Use  . Smoking status: Former Smoker    Years: 5.00    Types: Cigarettes    Quit date: 08/31/2014    Years since quitting: 6.3  . Smokeless tobacco: Never Used   Vaping Use  . Vaping Use: Never used  Substance and Sexual Activity  . Alcohol use: Not Currently    Comment: 3-4 drinks occasionally  . Drug use: No  . Sexual activity: Yes    Birth control/protection: None  Other Topics Concern  . Not on file  Social History Narrative  . Not on file   Social Determinants of Health   Financial Resource Strain: Not on file  Food Insecurity: Not on file  Transportation Needs: Not on file  Physical Activity: Not on file  Stress: Not on file  Social Connections: Not on file  Intimate Partner Violence: Not on file   No current facility-administered medications on file prior to encounter.   Current Outpatient Medications on File Prior to Encounter  Medication Sig Dispense Refill  . acetaminophen (TYLENOL) 500 MG tablet Take 1,000 mg by mouth every 6 (six) hours as needed for mild pain, moderate pain or headache.    . bismuth subsalicylate (PEPTO BISMOL) 262 MG/15ML suspension Take 30 mLs by mouth every 6 (six) hours as needed for indigestion or diarrhea or loose stools.    . calcium carbonate (TUMS - DOSED IN MG ELEMENTAL CALCIUM) 500 MG chewable tablet Chew 1 tablet by mouth daily as needed for indigestion or heartburn.    . ondansetron (ZOFRAN ODT) 8 MG disintegrating  tablet 8mg  ODT q4 hours prn nausea 4 tablet 0  . oxyCODONE (ROXICODONE) 5 MG immediate release tablet Take 1 tablet (5 mg total) by mouth every 6 (six) hours as needed for severe pain. 5 tablet 0   No Known Allergies  I have reviewed patient's Past Medical Hx, Surgical Hx, Family Hx, Social Hx, medications and allergies.   ROS:  Review of Systems  Constitutional: Negative for fever.  Gastrointestinal: Positive for abdominal pain. Negative for anorexia, constipation, diarrhea and nausea.  Genitourinary: Negative for dysuria and frequency.   Review of Systems  Other systems negative   Physical Exam  Physical Exam Patient Vitals for the past 24 hrs:  BP Temp Pulse Resp SpO2  Height Weight  01/05/21 2137 - - - - 100 % - -  01/05/21 2136 (!) 134/94 - 94 - - - -  01/05/21 2135 - (!) 97.4 F (36.3 C) - 16 - 5\' 5"  (1.651 m) 129.7 kg   Constitutional: Well-developed, well-nourished female in no acute distress.  Cardiovascular: normal rate Respiratory: normal effort GI: Abd soft, non-tender. Pos BS x 4 MS: Extremities nontender, no edema, normal ROM Neurologic: Alert and oriented x 4.  GU: Neg CVAT.  PELVIC EXAM: deferred, no pain now  LAB RESULTS Results for orders placed or performed during the hospital encounter of 01/05/21 (from the past 24 hour(s))  hCG, quantitative, pregnancy     Status: Abnormal   Collection Time: 01/05/21  9:42 PM  Result Value Ref Range   hCG, Beta Chain, Quant, S 143 (H) <5 mIU/mL  CBC     Status: Abnormal   Collection Time: 01/05/21  9:42 PM  Result Value Ref Range   WBC 10.1 4.0 - 10.5 K/uL   RBC 5.13 (H) 3.87 - 5.11 MIL/uL   Hemoglobin 13.1 12.0 - 15.0 g/dL   HCT 03/07/21 03/07/21 - 08.6 %   MCV 77.2 (L) 80.0 - 100.0 fL   MCH 25.5 (L) 26.0 - 34.0 pg   MCHC 33.1 30.0 - 36.0 g/dL   RDW 57.8 46.9 - 62.9 %   Platelets 254 150 - 400 K/uL   nRBC 0.0 0.0 - 0.2 %  Comprehensive metabolic panel     Status: Abnormal   Collection Time: 01/05/21  9:42 PM  Result Value Ref Range   Sodium 134 (L) 135 - 145 mmol/L   Potassium 4.2 3.5 - 5.1 mmol/L   Chloride 103 98 - 111 mmol/L   CO2 24 22 - 32 mmol/L   Glucose, Bld 93 70 - 99 mg/dL   BUN 8 6 - 20 mg/dL   Creatinine, Ser 41.3 0.44 - 1.00 mg/dL   Calcium 8.8 (L) 8.9 - 10.3 mg/dL   Total Protein 7.0 6.5 - 8.1 g/dL   Albumin 4.0 3.5 - 5.0 g/dL   AST 30 15 - 41 U/L   ALT 28 0 - 44 U/L   Alkaline Phosphatase 44 38 - 126 U/L   Total Bilirubin 0.9 0.3 - 1.2 mg/dL   GFR, Estimated 03/07/21 2.44 mL/min   Anion gap 7 5 - 15    IMAGING   MAU Management/MDM: Ordered usual first trimester r/o ectopic labs.   Pelvic exam and cultures done Will check baseline Ultrasound to rule out  ectopic.  Consult *** with presentation, exam findings, and results.   Treatments in MAU included ***.   This bleeding/pain can represent a normal pregnancy with bleeding, spontaneous abortion or even an ectopic which can be life-threatening.  The process  as listed above helps to determine which of these is present.    ASSESSMENT 1. Pregnancy of unknown anatomic location     PLAN Discharge home Plan to repeat HCG level in 48 hours in clinic per 11:00 am schedule Will repeat  Ultrasound in about 7-10 days if HCG levels double appropriately  Ectopic precautions   Pt stable at time of discharge. Encouraged to return here if she develops worsening of symptoms, increase in pain, fever, or other concerning symptoms.    Wynelle Bourgeois CNM, MSN Certified Nurse-Midwife 01/05/2021  10:26 PM

## 2021-01-05 NOTE — MAU Note (Signed)
Here for repeat BHCG. Denies any pain or VB. Some nausea. Understands will have labs drawn and then provider will discuss results and plan of care. Pt knows not to leave while waiting for lab results.

## 2021-01-06 NOTE — MAU Note (Signed)
Pt asked if she would need Rhogam since she is RH neg. Asked Artelia Laroche CNM and pt was told since she has not had any bleeding with the pregnancy she would not need Rhogam. IF she has to have surgery for the ectopic then she would receive Rhogam. Pt voices understanding

## 2021-01-06 NOTE — Discharge Instructions (Signed)
Methotrexate Treatment for an Ectopic Pregnancy Methotrexate is a medicine that treats an ectopic pregnancy. In this type of pregnancy, the fertilized egg attaches (implants) outside the uterus. An ectopic pregnancy cannot develop into a healthy baby. Methotrexate works by stopping the growth of the fertilized egg. It also helps the body absorb tissue from the egg. This takes about 2-6 weeks. An ectopic pregnancy can be life-threatening. However, most ectopic pregnancies can be successfully treated with methotrexate if they are diagnosed early. Tell a health care provider about:  Any allergies you have.  All medicines you are taking, including vitamins, herbs, eye drops, creams, and over-the-counter medicines.  Any medical conditions you have. What are the risks? Generally, this is a safe treatment. However, problems may occur, including:  Digestive problems. You may have: ? Nausea. ? Vomiting. ? Diarrhea. ? Cramping in your abdomen.  Bleeding or spotting from your vagina.  Feeling dizzy or light-headed.  Mouth sores.  Inflammation of the lining of your lungs (pneumonitis).  Damage to nearby structures or organs, such as damage to the liver.  Hair loss. There is a risk that methotrexate treatment will fail and the pregnancy will continue. There is also a risk that the ectopic pregnancy might tear or burst (rupture) during use of this medicine. What happens before the procedure?  Blood tests will be done to check how your disease-fighting system (immune system), liver, and kidneys are working.  You will also have blood tests to measure your pregnancy hormone levels and to find out your blood type.  You will be given a shot of a medicine called Rho(D) immune globulin if: ? You are Rh-negative and the father is Rh-positive. ? You are Rh-negative and the father's Rh type is unknown. What happens during the procedure?  Methotrexate will be injected into your  muscle. ? Methotrexate may be given as a single dose of medicine or a series of doses over time, depending on your response to the treatment. ? Methotrexate injections are given by a health care provider. Injection is the most common way that this medicine is used to treat an ectopic pregnancy.  You may also receive other medicines to manage your ectopic pregnancy. The procedure may vary among health care providers and hospitals. What can I expect after treatment? After your treatment, it is common to have:  Cramping in your abdomen.  Bleeding in your vagina.  Tiredness (fatigue).  Nausea.  Vomiting.  Diarrhea. Blood tests will be done at timed intervals for several days or weeks to check your pregnancy hormone levels. The blood tests will be done until the pregnancy hormone can no longer be found in the blood. If the methotrexate treatment does not work, a surgical procedure may be done to remove the ectopic pregnancy. Follow these instructions at home: Medicines  Take over-the-counter and prescription medicines only as told by your health care provider.  Do not take prescription pain medicines, aspirin, ibuprofen, naproxen, or any other NSAIDs.  Do not take folic acid, prenatal vitamins, or other vitamins that contain folic acid. Activity  Do not have sex, douche, or put anything, such as tampons, in your vagina until your health care provider says it is okay.  Limit activities that take a lot of effort as told by your health care provider. General instructions  Do not drink alcohol.  Follow instructions from your health care provider about eating restrictions, such as avoiding foods that produce a lot of gas. These foods can hide the signs of a   ruptured ectopic pregnancy.  Limit exposure to sunlight or artificial UV light such as from tanning beds. Methotrexate can make you more sensitive to the sun.  Follow instructions from your health care provider on how and when to  report any symptoms that may indicate a ruptured ectopic pregnancy.  Keep all follow-up visits. This is important.   Contact a health care provider if:  You have persistent nausea and vomiting.  You have persistent diarrhea.  You are having a reaction to the medicine. This may include: ? Unusual fatigue. ? Skin rash. Get help right away if:  Pain in your abdomen or in the area between your hip bones (pelvic area) gets worse.  You have more bleeding from your vagina.  You feel light-headed or you faint.  You are short of breath.  Your heart rate increases.  You develop a cough.  You have chills or a fever. Summary  Methotrexate is a medicine that treats an ectopic pregnancy. This type of pregnancy forms outside the uterus.  There is a risk that methotrexate treatment will fail and the pregnancy will continue. There is also a risk that the ectopic pregnancy might tear or burst during use of this medicine.  This medicine may be given in a single dose or a series of doses over time.  After your treatment, blood tests will be done at timed intervals for several days or weeks to check your pregnancy hormone levels. The blood tests will be done until no more pregnancy hormone is found in the blood. This information is not intended to replace advice given to you by your health care provider. Make sure you discuss any questions you have with your health care provider. Document Revised: 01/29/2020 Document Reviewed: 01/29/2020 Elsevier Patient Education  2021 [employer]vier Inc[employer]  

## 2021-01-06 NOTE — Progress Notes (Signed)
Written and verbal d/c instructions given and understanding voiced. Given MTX in Ectopic preg papers and reviewed with pt and understanding voiced. Will return to MAU Sat by lunch for repeat BHCG and then Day 7 at [address] office. To return sooner for any concerns.

## 2021-01-09 ENCOUNTER — Inpatient Hospital Stay (HOSPITAL_COMMUNITY)
Admission: AD | Admit: 2021-01-09 | Discharge: 2021-01-09 | Disposition: A | Discharge status: Home / Self Care | Payer: Medicaid Other | Attending: Obstetrics & Gynecology | Admitting: Obstetrics & Gynecology

## 2021-01-09 ENCOUNTER — Other Ambulatory Visit: Payer: Self-pay

## 2021-01-09 DIAGNOSIS — Z6791 Unspecified blood type, Rh negative: Secondary | ICD-10-CM | POA: Insufficient documentation

## 2021-01-09 DIAGNOSIS — O009 Unspecified ectopic pregnancy without intrauterine pregnancy: Secondary | ICD-10-CM | POA: Diagnosis not present

## 2021-01-09 DIAGNOSIS — O26891 Other specified pregnancy related conditions, first trimester: Secondary | ICD-10-CM

## 2021-01-09 DIAGNOSIS — Z3A00 Weeks of gestation of pregnancy not specified: Secondary | ICD-10-CM | POA: Diagnosis not present

## 2021-01-09 DIAGNOSIS — O4690 Antepartum hemorrhage, unspecified, unspecified trimester: Secondary | ICD-10-CM | POA: Insufficient documentation

## 2021-01-09 DIAGNOSIS — R109 Unspecified abdominal pain: Secondary | ICD-10-CM | POA: Insufficient documentation

## 2021-01-09 DIAGNOSIS — O26899 Other specified pregnancy related conditions, unspecified trimester: Secondary | ICD-10-CM | POA: Insufficient documentation

## 2021-01-09 LAB — HCG, QUANTITATIVE, PREGNANCY: hCG, Beta Chain, Quant, S: 43 m[IU]/mL — ABNORMAL HIGH (ref <5)

## 2021-01-09 MED ORDER — RHO D IMMUNE GLOBULIN 1500 UNIT/2ML IJ SOSY
300.0000 ug | PREFILLED_SYRINGE | Freq: Once | INTRAMUSCULAR | Status: AC
  Administered 2021-01-09: 300 ug via INTRAMUSCULAR
  Filled 2021-01-09: qty 2

## 2021-01-09 NOTE — MAU Provider Note (Signed)
History   Chief Complaint:  Follow-up, Vaginal Bleeding, and Abdominal Pain   Carly Rush is  30 y.o. [id number] No LMP recorded (lmp unknown). Patient is pregnant.. Patient is here for follow up of quantitative HCG and ongoing surveillance of pregnancy status. She is Unknown weeks gestation  by LMP.   She is here for day 4 labs after receiving methotraxate for suspected ectopic pregnancy based on abnormal HCGs.   Since her last visit, the patient is with new complaint. The patient reports bleeding as  spotting.  She reports abdominal cramping yesterday that improved with tylenol.   Her previous Quantitative HCG values are:  Component     Latest Ref Rng & Units 01/01/2021 01/03/2021 01/05/2021  HCG, Beta Chain, Quant, S     <5 mIU/mL 207 (H) 196 (H) 143 (H) MTX given     Physical Exam   Blood pressure 125/71, pulse 87, temperature 98.5 F (36.9 C), temperature source Oral, resp. rate 17, SpO2 97 %, unknown if currently breastfeeding.  Physical Examination: General appearance - alert, well appearing, and in no distress Mental status - normal mood, behavior, speech, dress, motor activity, and thought processes Eyes - sclera anicteric Chest - normal respiratory effort Abdomen - soft, nontender, nondistended, no masses or organomegaly   Labs: Results for orders placed or performed during the hospital encounter of 01/09/21 (from the past 24 hour(s))  hCG, quantitative, pregnancy   Collection Time: 01/09/21 10:46 AM  Result Value Ref Range   hCG, Beta Chain, Quant, S 43 (H) <5 mIU/mL  Rh IG workup (includes ABO/Rh)   Collection Time: 01/09/21 10:46 AM  Result Value Ref Range   Gestational Age(Wks) 5    ABO/RH(D) O NEG    Antibody Screen NEG    Unit Number [id number]    Blood Component Type RHIG    Unit division 00    Status of Unit ISSUED    Transfusion Status      OK TO TRANSFUSE Performed at [facility], [address] [place], [place] [address]        Assessment:   1. Ectopic pregnancy without intrauterine pregnancy, unspecified location   2. Rh negative state in antepartum period, first trimester     -HCG down to 43 from 43 -RH negative - rhogam given today  Plan: -Discharge home in stable condition -Bleeding/ectopic precautions discussed -Patient advised to follow-up with [facility] on Tuesday for scheduled day 7 labs -Patient may return to MAU as needed or if her condition were to change or worsen  Judeth Horn, NP 01/09/2021, 1:50 PM

## 2021-01-09 NOTE — Discharge Instructions (Signed)
Return to care  If you have heavier bleeding that soaks through more that 2 pads per hour for an hour or more If you bleed so much that you feel like you might pass out or you do pass out If you have significant abdominal pain that is not improved with Tylenol   

## 2021-01-09 NOTE — MAU Note (Signed)
Pt reports to mau for follow up blood work.  Pt states she started bleeding yesterday but reports it is "light"  Pt states she also started cramping yesterday.

## 2021-01-10 LAB — RH IG WORKUP (INCLUDES ABO/RH)
ABO/RH(D): O NEG
Antibody Screen: NEGATIVE
Gestational Age(Wks): 5
Unit division: 0

## 2021-01-12 ENCOUNTER — Ambulatory Visit: Payer: Medicaid Other

## 2021-01-13 ENCOUNTER — Other Ambulatory Visit: Payer: Self-pay

## 2021-01-13 ENCOUNTER — Ambulatory Visit (INDEPENDENT_AMBULATORY_CARE_PROVIDER_SITE_OTHER): Payer: Medicaid Other | Admitting: *Deleted

## 2021-01-13 ENCOUNTER — Encounter: Payer: Self-pay | Admitting: *Deleted

## 2021-01-13 VITALS — BP 138/79 | HR 84

## 2021-01-13 DIAGNOSIS — O009 Unspecified ectopic pregnancy without intrauterine pregnancy: Secondary | ICD-10-CM | POA: Diagnosis not present

## 2021-01-13 LAB — BETA HCG QUANT (REF LAB): hCG Quant: 1 m[IU]/mL

## 2021-01-13 NOTE — Progress Notes (Signed)
Stat bhcg results returned =1. Reviewed with Dr. Debroah Loop and plan of care is for patient to have follow up in 2 weeks any provider for ectopic pregnancy follow up.  Per patient will send MyChart message to notify her.  Sent message to registrar to schedule follow up. Zainah Steven,RN

## 2021-01-13 NOTE — Progress Notes (Signed)
Here for day 7 stat bhcg after methotrexate 01/06/21. Denies any pain. States bleeding stopped yesterday. Advised will draw stat bhcg and contact with results in a few hours. She states she cannot be reached by phone call but can check Mychart messages. Expalined very important for [place] to be able to reach her in case she needs retreatment. She confirms she will read her  messages and respond. Carly Rush

## 2021-01-27 ENCOUNTER — Ambulatory Visit: Payer: Medicaid Other | Admitting: Certified Nurse Midwife

## 2021-10-18 DIAGNOSIS — F411 Generalized anxiety disorder: Secondary | ICD-10-CM | POA: Insufficient documentation

## 2022-05-21 IMAGING — US US OB < 14 WEEKS - US OB TV
1 series · 15 of 28 positions shown · non-contrast
Comparison: None

CLINICAL DATA: Abdominal cramping on LEFT in first trimester of
pregnancy, unknown LMP, irregular cycles, quantitative beta HCG 207

EXAM:
OBSTETRIC <14 WK US AND TRANSVAGINAL OB US
TECHNIQUE: Both transabdominal and transvaginal ultrasound examinations were
performed for complete evaluation of the gestation as well as the
maternal uterus, adnexal regions, and pelvic cul-de-sac.
Transvaginal technique was performed to assess early pregnancy.

[Series 1: us ob < 14 weeks - us ob tv · 15 of 90 slices shown]
[im 1/90]
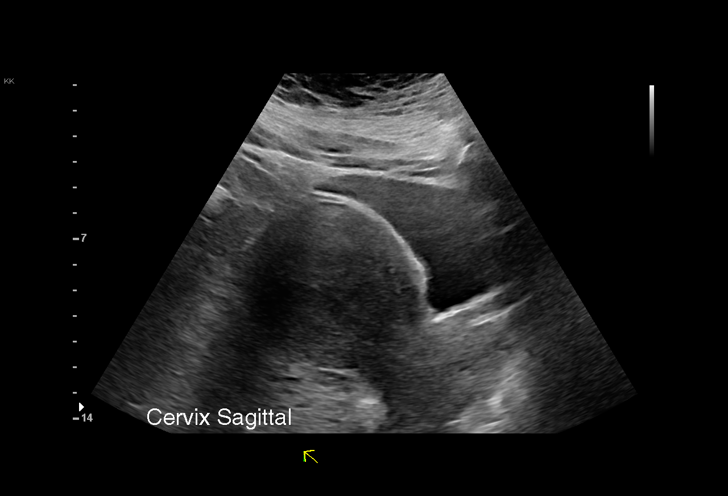
[im 7/90]
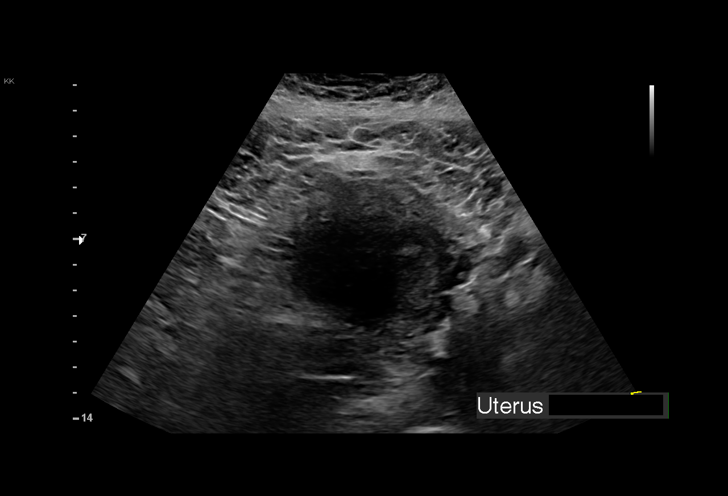
[im 14/90]
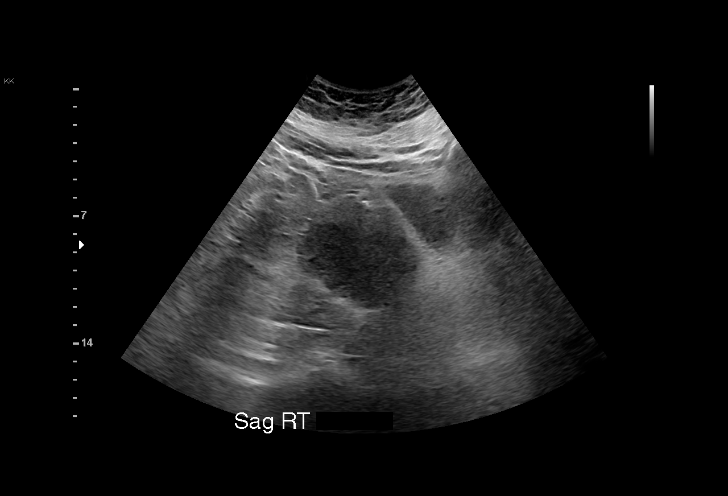
[im 20/90]
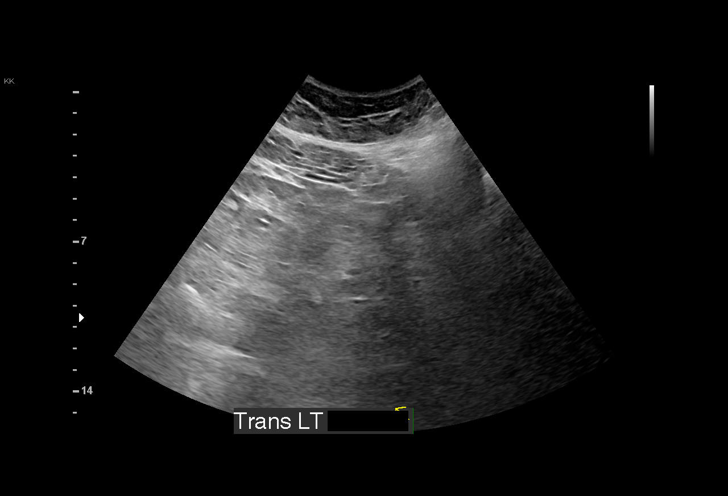
[im 27/90]
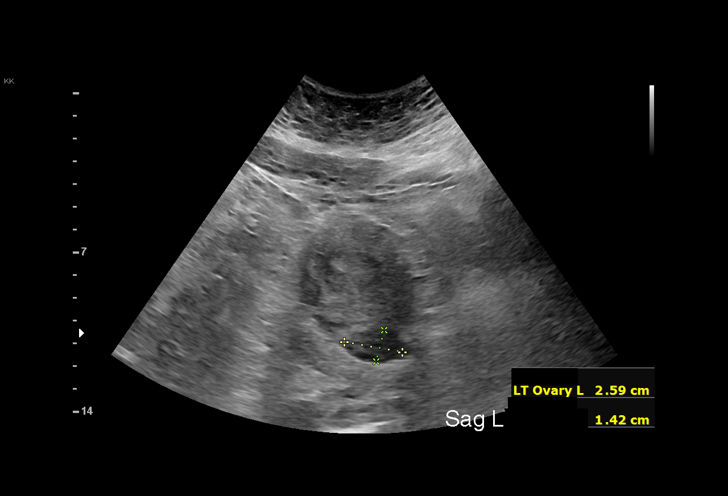
[im 33/90]
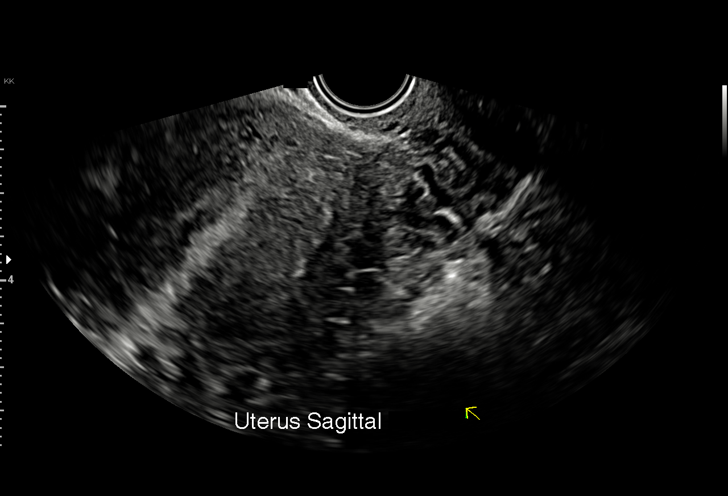
[im 40/90]
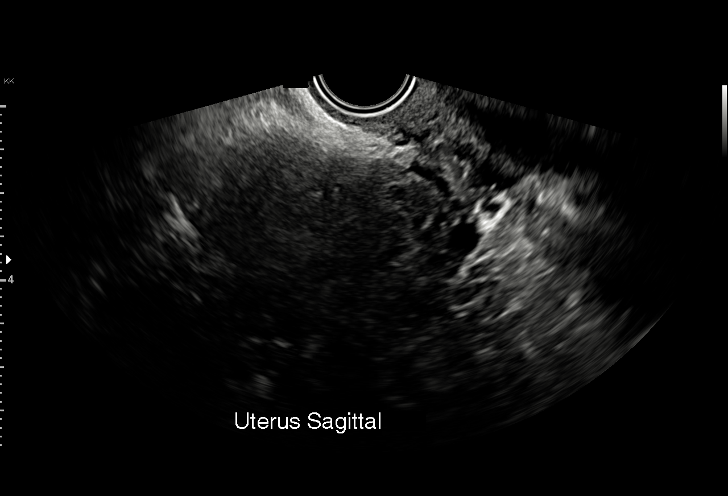
[im 47/90]
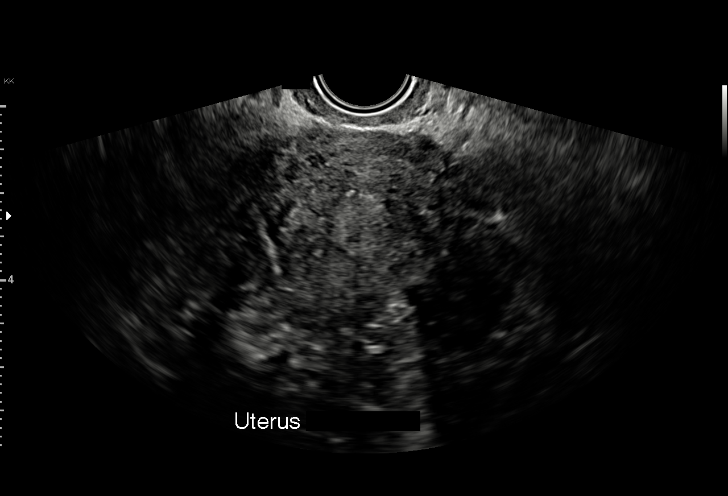
[im 50/90]
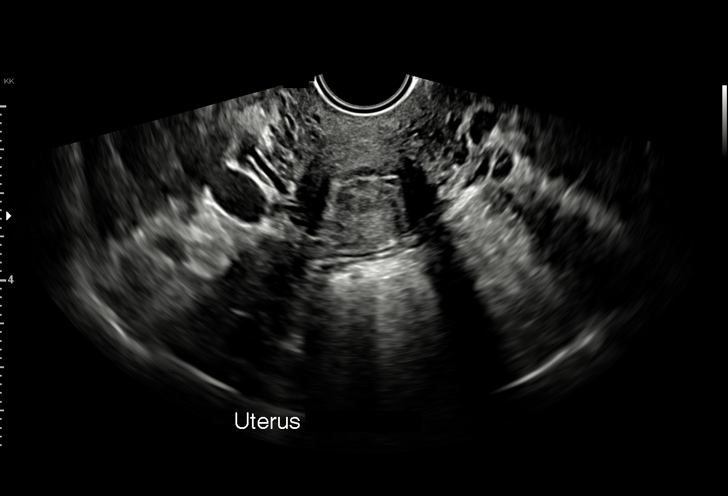
[im 57/90]
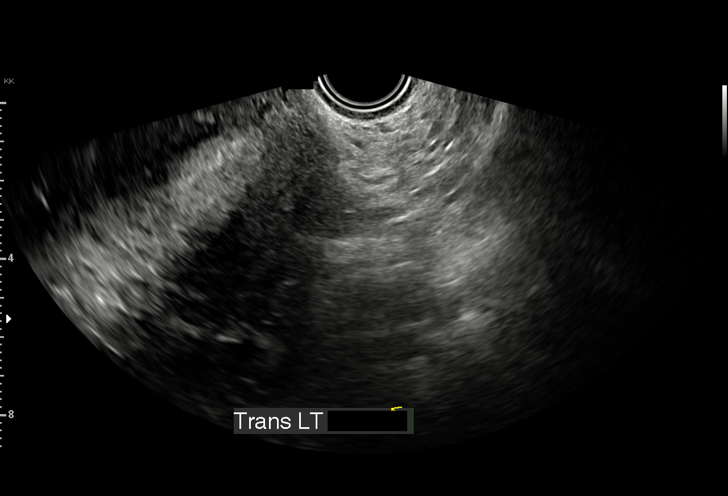
[im 63/90]
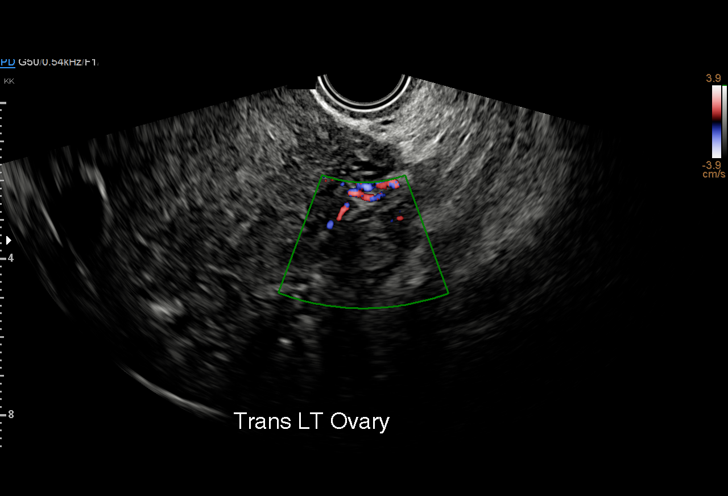
[im 70/90]
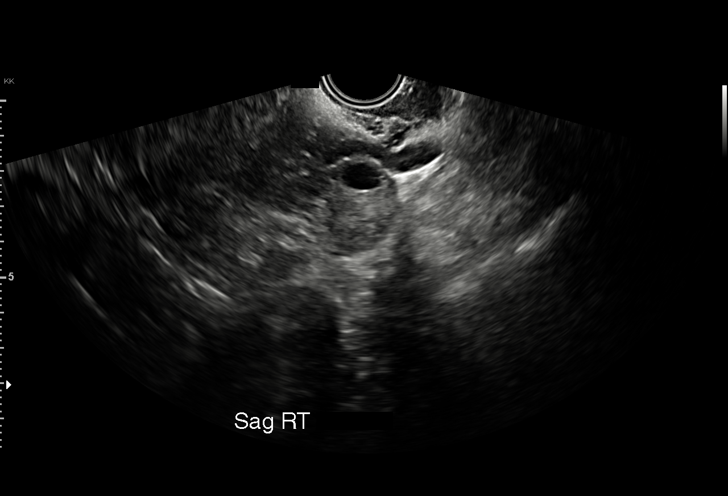
[im 76/90]
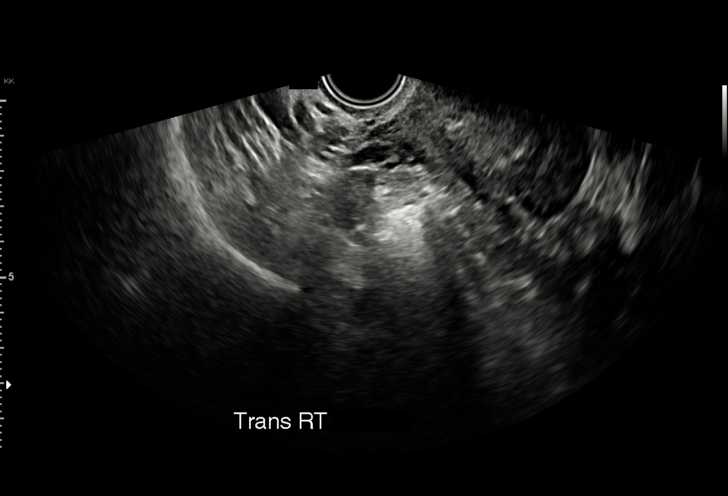
[im 83/90]
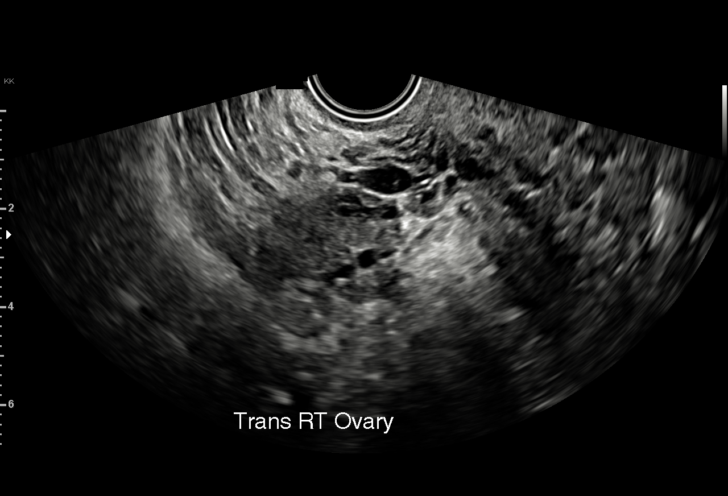
[im 90/90]
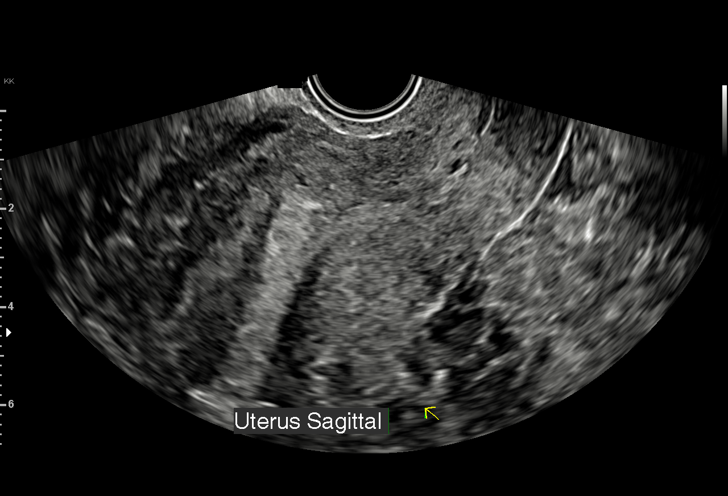

[15 of 28 positions shown; findings below may reference images not displayed]

FINDINGS: Intrauterine gestational sac: Absent

Yolk sac:  N/A

Embryo:  N/A

Cardiac Activity: N/A

Heart Rate: N/A  bpm

MSD:   mm    w     d

CRL:    mm    w    d                  US EDC:

Subchorionic hemorrhage:  N/A

Maternal uterus/adnexae:

Uterus anteverted, normal in appearance.

No uterine mass identified.

Endometrial complex normal in thickness, 11 mm thick without mass,
fluid or gestational sac.

LEFT ovary normal size and morphology, 2.4 x 3.1 x 1.8 cm.

RIGHT ovary normal size and morphology, 1.7 x 3.0 x 2.5 cm.

Small RIGHT paraovarian cyst noted, 15 mm diameter with small amount
of adjacent fluid.

No other adnexal masses or free pelvic fluid.
IMPRESSION: No intrauterine gestation identified.

Findings are consistent with pregnancy of unknown location.

Differential diagnosis includes early intrauterine pregnancy too
early to visualize, spontaneous abortion, and ectopic pregnancy.

Serial quantitative beta HCG and or follow-up ultrasound recommended
to definitively exclude ectopic pregnancy.

Small RIGHT paraovarian cyst; no follow-up imaging recommended

## 2023-09-12 DIAGNOSIS — Z8619 Personal history of other infectious and parasitic diseases: Secondary | ICD-10-CM | POA: Insufficient documentation

## 2023-11-06 ENCOUNTER — Inpatient Hospital Stay (HOSPITAL_COMMUNITY)
Admission: AD | Admit: 2023-11-06 | Discharge: 2023-11-07 | Disposition: A | Discharge status: Home / Self Care | Attending: Obstetrics and Gynecology | Admitting: Obstetrics and Gynecology

## 2023-11-06 ENCOUNTER — Encounter (HOSPITAL_COMMUNITY): Payer: Self-pay | Admitting: Obstetrics and Gynecology

## 2023-11-06 ENCOUNTER — Other Ambulatory Visit: Payer: Self-pay

## 2023-11-06 DIAGNOSIS — O26892 Other specified pregnancy related conditions, second trimester: Secondary | ICD-10-CM | POA: Insufficient documentation

## 2023-11-06 DIAGNOSIS — R519 Headache, unspecified: Secondary | ICD-10-CM | POA: Insufficient documentation

## 2023-11-06 DIAGNOSIS — Z3A20 20 weeks gestation of pregnancy: Secondary | ICD-10-CM | POA: Diagnosis not present

## 2023-11-06 DIAGNOSIS — R42 Dizziness and giddiness: Secondary | ICD-10-CM | POA: Diagnosis present

## 2023-11-06 DIAGNOSIS — R030 Elevated blood-pressure reading, without diagnosis of hypertension: Secondary | ICD-10-CM | POA: Diagnosis present

## 2023-11-06 LAB — URINALYSIS, ROUTINE W REFLEX MICROSCOPIC
Bilirubin Urine: NEGATIVE
Glucose, UA: NEGATIVE mg/dL
Hgb urine dipstick: NEGATIVE
Ketones, ur: NEGATIVE mg/dL
Nitrite: NEGATIVE
Protein, ur: NEGATIVE mg/dL
Specific Gravity, Urine: 1.032 — ABNORMAL HIGH (ref 1.005–1.030)
pH: 6 (ref 5.0–8.0)

## 2023-11-06 LAB — PROTEIN / CREATININE RATIO, URINE
Creatinine, Urine: 258 mg/dL
Protein Creatinine Ratio: 0.03 mg/mg{creat} (ref 0.00–0.15)
Total Protein, Urine: 9 mg/dL

## 2023-11-06 MED ORDER — DIPHENHYDRAMINE HCL 50 MG/ML IJ SOLN
25.0000 mg | Freq: Once | INTRAMUSCULAR | Status: AC
  Administered 2023-11-07: 25 mg via INTRAVENOUS
  Filled 2023-11-06: qty 1

## 2023-11-06 MED ORDER — ACETAMINOPHEN-CAFFEINE 500-65 MG PO TABS
2.0000 | ORAL_TABLET | Freq: Once | ORAL | Status: AC
  Administered 2023-11-06: 2 via ORAL
  Filled 2023-11-06: qty 2

## 2023-11-06 MED ORDER — LACTATED RINGERS IV BOLUS
1000.0000 mL | Freq: Once | INTRAVENOUS | Status: AC
  Administered 2023-11-06: 1000 mL via INTRAVENOUS

## 2023-11-06 MED ORDER — METOCLOPRAMIDE HCL 5 MG/ML IJ SOLN
10.0000 mg | Freq: Once | INTRAMUSCULAR | Status: AC
  Administered 2023-11-07: 10 mg via INTRAVENOUS
  Filled 2023-11-06: qty 2

## 2023-11-06 NOTE — MAU Provider Note (Signed)
 Chief Complaint:  Headache   HPI   Event Date/Time   First Provider Initiated Contact with Patient 11/06/23 2311      Carly Rush is a 33 y.o. [id number] at [redacted]w[redacted]d who presents to maternity admissions reporting HA that started around 1845 with dizziness and some visual changes.  - Took 1000 mg Tylenol at 1930 with little resolve. She reports that she works 3rd shift and has had difficulty sleeping " only about 3 hours of sleep per night"  Denies any RUQ pain, N/V and reports no OB c/o. Denies VB, LOF, CTX;s and feels fetal movements  Pregnancy Course: [facility]  Past Medical History:  Diagnosis Date   Asthma    childhood   Chlamydia 2011   Gastric reflux    GERD (gastroesophageal reflux disease)    Gonorrhea 2011   Headache(784.0)    Hypertension    Pregnancy induced hypertension    Von Willebrand disease (HCC)    OB History  Gravida Para Term Preterm AB Living  6 3 3  0 1 2  SAB IAB Ectopic Multiple Live Births  0 1 0 0 3    # Outcome Date GA Lbr Len/2nd Weight Sex Type Anes PTL Lv  6 Current           5 Term 04/24/18 [redacted]w[redacted]d 02:43 / 00:16 3105 g F Vag-Spont EPI  LIV  4 Term 2018    M Vag-Spont   LIV  3 Term 05/29/13 [redacted]w[redacted]d / 01:14 3150 g M Vag-Spont EPI  DEC     Birth Comments: baby died @ age 31 mos. of unknown cause  2 Gravida           1 IAB            Past Surgical History:  Procedure Laterality Date   CHOLECYSTECTOMY     DILATION AND CURETTAGE OF UTERUS     INDUCED ABORTION     TONSILLECTOMY     WISDOM TOOTH EXTRACTION     Family History  Problem Relation Age of Onset   Cancer Mother        cervical   Hypertension Father    Arthritis Father    Drug abuse Father    Obesity Father    Diabetes Brother    ADD / ADHD Brother    Depression Brother    Obesity Brother    Early death Son    Miscarriages / Stillbirths Maternal Aunt    Anxiety disorder Maternal Grandmother    Cancer Maternal Grandmother    Anesthesia problems Neg Hx    Social History   Tobacco Use    Smoking status: Former    Current packs/day: 0.00    Types: Cigarettes    Start date: 08/31/2009    Quit date: 08/31/2014    Years since quitting: 9.1   Smokeless tobacco: Never  Vaping Use   Vaping status: Never Used  Substance Use Topics   Alcohol use: Not Currently    Comment: 3-4 drinks occasionally   Drug use: No   No Known Allergies Medications Prior to Admission  Medication Sig Dispense Refill Last Dose/Taking   acetaminophen (TYLENOL) 500 MG tablet Take 1,000 mg by mouth every 6 (six) hours as needed for mild pain, moderate pain or headache.   11/06/2023 at  7:30 PM   aspirin EC 81 MG tablet Take 81 mg by mouth daily. Swallow whole.   11/06/2023 at  4:00 PM   Prenatal Vit-Fe Fumarate-FA (MULTIVITAMIN-PRENATAL) 27-0.8 MG TABS tablet  Take 1 tablet by mouth daily at 12 noon.   11/06/2023 at  4:00 PM   ascorbic acid (VITAMIN C) 500 MG tablet Take by mouth.      bismuth subsalicylate (PEPTO BISMOL) 262 MG/15ML suspension Take 30 mLs by mouth every 6 (six) hours as needed for indigestion or diarrhea or loose stools.      calcium carbonate (TUMS - DOSED IN MG ELEMENTAL CALCIUM) 500 MG chewable tablet Chew 1 tablet by mouth daily as needed for indigestion or heartburn.      ondansetron (ZOFRAN ODT) 8 MG disintegrating tablet 8mg  ODT q4 hours prn nausea 4 tablet 0    oxyCODONE (ROXICODONE) 5 MG immediate release tablet Take 1 tablet (5 mg total) by mouth every 6 (six) hours as needed for severe pain. 5 tablet 0    zinc gluconate 50 MG tablet Take 50 mg by mouth daily.       I have reviewed patient's Past Medical Hx, Surgical Hx, Family Hx, Social Hx, medications and allergies.   ROS  Pertinent items noted in HPI and remainder of comprehensive ROS otherwise negative.   PHYSICAL EXAM  Patient Vitals for the past 24 hrs:  BP Temp Temp src Pulse Resp SpO2 Height Weight  11/06/23 2225 121/64 -- -- 85 -- 100 % -- --  11/06/23 2204 129/70 98.2 F (36.8 C) Oral 73 17 100 % 5\' 4"  (1.626 m) 132.4  kg    Constitutional: Well-developed, obese  female in no acute distress.  Cardiovascular: normal rate & rhythm, warm and well-perfused Respiratory: normal effort, no problems with respiration noted GI: Abd soft, non-tender, gravid MS: Extremities nontender, no edema, normal ROM Neurologic: Alert and oriented x 4.  GU: no CVA tenderness Pelvic: Deferred     Fetal Tracing: 155 via doppler  Labs: Results for orders placed or performed during the hospital encounter of 11/06/23 (from the past 24 hours)  Urinalysis, Routine w reflex microscopic -Urine, Clean Catch     Status: Abnormal   Collection Time: 11/06/23 10:10 PM  Result Value Ref Range   Color, Urine YELLOW YELLOW   APPearance HAZY (A) CLEAR   Specific Gravity, Urine 1.032 (H) 1.005 - 1.030   pH 6.0 5.0 - 8.0   Glucose, UA NEGATIVE NEGATIVE mg/dL   Hgb urine dipstick NEGATIVE NEGATIVE   Bilirubin Urine NEGATIVE NEGATIVE   Ketones, ur NEGATIVE NEGATIVE mg/dL   Protein, ur NEGATIVE NEGATIVE mg/dL   Nitrite NEGATIVE NEGATIVE   Leukocytes,Ua TRACE (A) NEGATIVE   RBC / HPF 0-5 0 - 5 RBC/hpf   WBC, UA 0-5 0 - 5 WBC/hpf   Bacteria, UA RARE (A) NONE SEEN   Squamous Epithelial / HPF 6-10 0 - 5 /HPF   Mucus PRESENT    Ca Oxalate Crys, UA PRESENT   Protein / creatinine ratio, urine     Status: None   Collection Time: 11/06/23 10:10 PM  Result Value Ref Range   Creatinine, Urine 258 mg/dL   Total Protein, Urine 9 mg/dL   Protein Creatinine Ratio 0.03 0.00 - 0.15 mg/mg[Cre]  CBC     Status: Abnormal   Collection Time: 11/07/23 12:12 AM  Result Value Ref Range   WBC 11.6 (H) 4.0 - 10.5 K/uL   RBC 4.39 3.87 - 5.11 MIL/uL   Hemoglobin 11.6 (L) 12.0 - 15.0 g/dL   HCT 16.1 (L) 09.6 - 04.5 %   MCV 80.0 80.0 - 100.0 fL   MCH 26.4 26.0 - 34.0 pg   MCHC  33.0 30.0 - 36.0 g/dL   RDW 16.1 09.6 - 04.5 %   Platelets 204 150 - 400 K/uL   nRBC 0.0 0.0 - 0.2 %  Comprehensive metabolic panel     Status: Abnormal   Collection Time:  11/07/23 12:12 AM  Result Value Ref Range   Sodium 138 135 - 145 mmol/L   Potassium 4.0 3.5 - 5.1 mmol/L   Chloride 107 98 - 111 mmol/L   CO2 21 (L) 22 - 32 mmol/L   Glucose, Bld 75 70 - 99 mg/dL   BUN 7 6 - 20 mg/dL   Creatinine, Ser 4.09 0.44 - 1.00 mg/dL   Calcium 9.0 8.9 - 81.1 mg/dL   Total Protein 6.3 (L) 6.5 - 8.1 g/dL   Albumin 3.0 (L) 3.5 - 5.0 g/dL   AST 11 (L) 15 - 41 U/L   ALT 9 0 - 44 U/L   Alkaline Phosphatase 38 38 - 126 U/L   Total Bilirubin 0.5 0.0 - 1.2 mg/dL   GFR, Estimated >91 >47 mL/min   Anion gap 10 5 - 15      MDM & MAU COURSE  MDM:  HIGH  - Labs - Migraine cocktail - IVF   HA resolved after medication  VSS Labs unremarkable  I have reviewed the patient chart and performed the physical exam . I have ordered & interpreted the lab results  Medications ordered as stated below.  A/P as described below.  Counseling and education provided and patient agreeable  with plan as described below. Verbalized understanding.    MAU Course: Orders Placed This Encounter  Procedures   Urinalysis, Routine w reflex microscopic -Urine, Clean Catch   CBC   Comprehensive metabolic panel   Protein / creatinine ratio, urine   Discharge patient Discharge disposition: 01-Home or Self Care; Discharge patient date: 11/07/2023   Meds ordered this encounter  Medications   lactated ringers bolus 1,000 mL   acetaminophen-caffeine (EXCEDRIN TENSION HEADACHE) 500-65 MG per tablet 2 tablet   metoCLOPramide (REGLAN) injection 10 mg   diphenhydrAMINE (BENADRYL) injection 25 mg    ASSESSMENT   1. Acute nonintractable headache, unspecified headache type   2. [redacted] weeks gestation of pregnancy     PLAN  Discharge home in stable condition with return precautions.  F/U with OB as scheduled  See AVS for full description of verbal and written instructions provided Verbalized understanding and agrees with the plan as described above  No future appointments.     Allergies  as of 11/07/2023   No Known Allergies      Medication List     STOP taking these medications    oxyCODONE 5 MG immediate release tablet Commonly known as: Roxicodone       TAKE these medications    acetaminophen 500 MG tablet Commonly known as: TYLENOL Take 1,000 mg by mouth every 6 (six) hours as needed for mild pain, moderate pain or headache.   ascorbic acid 500 MG tablet Commonly known as: VITAMIN C Take by mouth.   aspirin EC 81 MG tablet Take 81 mg by mouth daily. Swallow whole.   bismuth subsalicylate 262 MG/15ML suspension Commonly known as: PEPTO BISMOL Take 30 mLs by mouth every 6 (six) hours as needed for indigestion or diarrhea or loose stools.   calcium carbonate 500 MG chewable tablet Commonly known as: TUMS - dosed in mg elemental calcium Chew 1 tablet by mouth daily as needed for indigestion or heartburn.   multivitamin-prenatal 27-0.8 MG  Tabs tablet Take 1 tablet by mouth daily at 12 noon.   ondansetron 8 MG disintegrating tablet Commonly known as: Zofran ODT 8mg  ODT q4 hours prn nausea   zinc gluconate 50 MG tablet Take 50 mg by mouth daily.        Marcell Barlow, MSN, [facility], [facility] [employer]

## 2023-11-06 NOTE — MAU Note (Signed)
.  Carly Rush is a 33 y.o. at [redacted]w[redacted]d here in MAU reporting: HA, dizzy, and visual changes "when I open my eyes it looks weird". Checked BP at work - 140s/80s then it came down to 100s/40s. Took 2 extra strength tylenol at 1930.   Onset of complaint: 1845 Pain score: 7 Vitals:   11/06/23 2204  BP: 129/70  Pulse: 73  Resp: 17  Temp: 98.2 F (36.8 C)  SpO2: 100%     FHT: 155  Lab orders placed from triage: UA

## 2023-11-07 DIAGNOSIS — O26892 Other specified pregnancy related conditions, second trimester: Secondary | ICD-10-CM

## 2023-11-07 DIAGNOSIS — R519 Headache, unspecified: Secondary | ICD-10-CM

## 2023-11-07 DIAGNOSIS — Z3A20 20 weeks gestation of pregnancy: Secondary | ICD-10-CM

## 2023-11-07 LAB — CBC
HCT: 35.1 % — ABNORMAL LOW (ref 36.0–46.0)
Hemoglobin: 11.6 g/dL — ABNORMAL LOW (ref 12.0–15.0)
MCH: 26.4 pg (ref 26.0–34.0)
MCHC: 33 g/dL (ref 30.0–36.0)
MCV: 80 fL (ref 80.0–100.0)
Platelets: 204 10*3/uL (ref 150–400)
RBC: 4.39 MIL/uL (ref 3.87–5.11)
RDW: 14.7 % (ref 11.5–15.5)
WBC: 11.6 10*3/uL — ABNORMAL HIGH (ref 4.0–10.5)
nRBC: 0 % (ref 0.0–0.2)

## 2023-11-07 LAB — COMPREHENSIVE METABOLIC PANEL
ALT: 9 U/L (ref 0–44)
AST: 11 U/L — ABNORMAL LOW (ref 15–41)
Albumin: 3 g/dL — ABNORMAL LOW (ref 3.5–5.0)
Alkaline Phosphatase: 38 U/L (ref 38–126)
Anion gap: 10 (ref 5–15)
BUN: 7 mg/dL (ref 6–20)
CO2: 21 mmol/L — ABNORMAL LOW (ref 22–32)
Calcium: 9 mg/dL (ref 8.9–10.3)
Chloride: 107 mmol/L (ref 98–111)
Creatinine, Ser: 0.67 mg/dL (ref 0.44–1.00)
GFR, Estimated: >60 mL/min (ref >60)
Glucose, Bld: 75 mg/dL (ref 70–99)
Potassium: 4 mmol/L (ref 3.5–5.1)
Sodium: 138 mmol/L (ref 135–145)
Total Bilirubin: 0.5 mg/dL (ref 0.0–1.2)
Total Protein: 6.3 g/dL — ABNORMAL LOW (ref 6.5–8.1)

## 2023-11-07 NOTE — Discharge Instructions (Signed)

## 2023-12-15 DIAGNOSIS — Z8489 Family history of other specified conditions: Secondary | ICD-10-CM | POA: Insufficient documentation

## 2023-12-24 ENCOUNTER — Inpatient Hospital Stay (HOSPITAL_COMMUNITY)
Admission: AD | Admit: 2023-12-24 | Discharge: 2023-12-25 | Disposition: A | Discharge status: Home / Self Care | Attending: Obstetrics and Gynecology | Admitting: Obstetrics and Gynecology

## 2023-12-24 ENCOUNTER — Other Ambulatory Visit: Payer: Self-pay

## 2023-12-24 ENCOUNTER — Encounter (HOSPITAL_COMMUNITY): Payer: Self-pay | Admitting: Obstetrics and Gynecology

## 2023-12-24 DIAGNOSIS — Z3A27 27 weeks gestation of pregnancy: Secondary | ICD-10-CM | POA: Insufficient documentation

## 2023-12-24 DIAGNOSIS — M549 Dorsalgia, unspecified: Secondary | ICD-10-CM | POA: Insufficient documentation

## 2023-12-24 DIAGNOSIS — Z3689 Encounter for other specified antenatal screening: Secondary | ICD-10-CM

## 2023-12-24 DIAGNOSIS — O99891 Other specified diseases and conditions complicating pregnancy: Secondary | ICD-10-CM

## 2023-12-24 DIAGNOSIS — R102 Pelvic and perineal pain: Secondary | ICD-10-CM | POA: Insufficient documentation

## 2023-12-24 DIAGNOSIS — O26892 Other specified pregnancy related conditions, second trimester: Secondary | ICD-10-CM | POA: Insufficient documentation

## 2023-12-24 NOTE — MAU Note (Addendum)
 Patient present to MAU Triage with complaints of abdominal and back pain that she rates a 7/10. Patient stated that back pain started first this evening around 1800. Patient then went to stand up around 1945 and stated that she had a sudden onset of lower abdominal pain that "felt like she got punched". Patient also expressed DFM with the sudden abdominal pain.This pain started off constant and now is intermittent. Patient took 1000 mg of tylenol  around 2015 this evening and states that it helped the slight HA she's been having on and off but did not help with abdominal or back pain. Patient denies bleeding, LOF, and recent intercourse.

## 2023-12-25 ENCOUNTER — Inpatient Hospital Stay (HOSPITAL_COMMUNITY)
Admission: AD | Admit: 2023-12-25 | Discharge: 2023-12-25 | Disposition: A | Discharge status: Home / Self Care | Source: Home / Self Care | Attending: Obstetrics and Gynecology | Admitting: Obstetrics and Gynecology

## 2023-12-25 ENCOUNTER — Encounter (HOSPITAL_COMMUNITY): Payer: Self-pay | Admitting: Obstetrics and Gynecology

## 2023-12-25 DIAGNOSIS — A6000 Herpesviral infection of urogenital system, unspecified: Secondary | ICD-10-CM | POA: Insufficient documentation

## 2023-12-25 DIAGNOSIS — R519 Headache, unspecified: Secondary | ICD-10-CM | POA: Diagnosis present

## 2023-12-25 DIAGNOSIS — R102 Pelvic and perineal pain: Secondary | ICD-10-CM

## 2023-12-25 DIAGNOSIS — R1030 Lower abdominal pain, unspecified: Secondary | ICD-10-CM | POA: Diagnosis present

## 2023-12-25 DIAGNOSIS — Z6791 Unspecified blood type, Rh negative: Secondary | ICD-10-CM | POA: Insufficient documentation

## 2023-12-25 DIAGNOSIS — Z3A27 27 weeks gestation of pregnancy: Secondary | ICD-10-CM

## 2023-12-25 DIAGNOSIS — M549 Dorsalgia, unspecified: Secondary | ICD-10-CM | POA: Diagnosis present

## 2023-12-25 DIAGNOSIS — O26892 Other specified pregnancy related conditions, second trimester: Secondary | ICD-10-CM | POA: Diagnosis not present

## 2023-12-25 MED ORDER — CYCLOBENZAPRINE HCL 10 MG PO TABS: 10.0000 mg | ORAL_TABLET | Freq: Two times a day (BID) | ORAL | 0 refills | Status: AC | PRN

## 2023-12-25 NOTE — MAU Provider Note (Signed)
 History     CSN: [id number]  Arrival date and time: 12/24/23 2219   Event Date/Time   First Provider Initiated Contact with Patient 12/25/23 0142      Chief Complaint  Patient presents with   Abdominal Pain   Back Pain   HPI [name] is a 33 y.o. year old [id number] female at [redacted]w[redacted]d weeks gestation who presents to MAU reporting back pain that started at 1800 today. She then had a sudden onset of lower abdominal pain that "felt like I got punched at the top of my vagina." She noticed DFM at that same time.She took Tylenol  1000 mg po at 2015 tonight without relief. She reports the Tylenol  did help with a slight H/A she had, but not with the abdominal and low back pain. She receives [facility] with a [facility] Provider; next appt is 01/04/2024.   OB History     Gravida  6   Para  3   Term  3   Preterm  0   AB  1   Living  2      SAB  0   IAB  1   Ectopic  0   Multiple  0   Live Births  3           Past Medical History:  Diagnosis Date   Asthma    childhood   Chlamydia 2011   Gastric reflux    GERD (gastroesophageal reflux disease)    Gonorrhea 2011   Headache(784.0)    Hypertension    Pregnancy induced hypertension    Von Willebrand disease (HCC)     Past Surgical History:  Procedure Laterality Date   CHOLECYSTECTOMY     DILATION AND CURETTAGE OF UTERUS     INDUCED ABORTION     TONSILLECTOMY     WISDOM TOOTH EXTRACTION      Family History  Problem Relation Age of Onset   Cancer Mother        cervical   Hypertension Father    Arthritis Father    Drug abuse Father    Obesity Father    Diabetes Brother    ADD / ADHD Brother    Depression Brother    Obesity Brother    Early death Son    Miscarriages / Stillbirths Maternal Aunt    Anxiety disorder Maternal Grandmother    Cancer Maternal Grandmother    Anesthesia problems Neg Hx     Social History   Tobacco Use   Smoking status: Former    Current packs/day: 0.00    Types: Cigarettes     Start date: 08/31/2009    Quit date: 08/31/2014    Years since quitting: 9.3   Smokeless tobacco: Never  Vaping Use   Vaping status: Never Used  Substance Use Topics   Alcohol use: Not Currently    Comment: 3-4 drinks occasionally   Drug use: No    Allergies: No Known Allergies  Medications Prior to Admission  Medication Sig Dispense Refill Last Dose/Taking   acetaminophen  (TYLENOL ) 500 MG tablet Take 1,000 mg by mouth every 6 (six) hours as needed for mild pain, moderate pain or headache.   12/24/2023 Evening   aspirin EC 81 MG tablet Take 81 mg by mouth daily. Swallow whole.   12/24/2023 Morning   Prenatal Vit-Fe Fumarate-FA (MULTIVITAMIN-PRENATAL) 27-0.8 MG TABS tablet Take 1 tablet by mouth daily at 12 noon.   12/24/2023 Morning   ascorbic acid (VITAMIN C) 500 MG tablet  Take by mouth.   More than a month   bismuth subsalicylate (PEPTO BISMOL) 262 MG/15ML suspension Take 30 mLs by mouth every 6 (six) hours as needed for indigestion or diarrhea or loose stools.   More than a month   calcium carbonate (TUMS - DOSED IN MG ELEMENTAL CALCIUM) 500 MG chewable tablet Chew 1 tablet by mouth daily as needed for indigestion or heartburn.   More than a month   ondansetron  (ZOFRAN  ODT) 8 MG disintegrating tablet 8mg  ODT q4 hours prn nausea 4 tablet 0 More than a month   zinc gluconate 50 MG tablet Take 50 mg by mouth daily.   More than a month    Review of Systems  Constitutional: Negative.   HENT: Negative.    Eyes: Negative.   Respiratory: Negative.    Cardiovascular: Negative.   Gastrointestinal: Negative.   Endocrine: Negative.   Genitourinary:  Positive for pelvic pain.       DFM  Musculoskeletal:  Positive for back pain.  Skin: Negative.   Allergic/Immunologic: Negative.   Neurological: Negative.   Hematological: Negative.   Psychiatric/Behavioral: Negative.     Physical Exam   Blood pressure (!) 110/58, pulse 82, temperature 97.7 F (36.5 C), temperature source Oral, resp. rate  16, weight 134.4 kg, SpO2 100%, unknown if currently breastfeeding.  Physical Exam Vitals and nursing note reviewed.  Constitutional:      Appearance: Normal appearance. She is morbidly obese.  Cardiovascular:     Rate and Rhythm: Normal rate.  Pulmonary:     Effort: Pulmonary effort is normal.  Abdominal:     Palpations: Abdomen is soft.  Musculoskeletal:        General: Normal range of motion.  Skin:    General: Skin is warm and dry.  Neurological:     Mental Status: She is alert and oriented to person, place, and time.  Psychiatric:        Mood and Affect: Mood normal.        Behavior: Behavior normal.        Thought Content: Thought content normal.        Judgment: Judgment normal.    REACTIVE NST - FHR: 135 bpm / moderate variability / accels present / decels absent / TOCO: none MAU Course  Procedures  MDM CEFM Patient unable to take Flexeril while in MAU d/t needing to drive home  Assessment and Plan  1. Pelvic pain affecting pregnancy in second trimester, antepartum (Primary) - Prescription for: Flexeril 10 mg po BID prn pain - Advised to purchase a maternity support belt to wear during working hours and prn  2. NST (non-stress test) reactive - Reassurance given that fetal well-being is normal on the FHR tracing - Patient reports feeling movement when she is laying on her LT side.  3. Back Pain in Pregnancy in second trimester - Prescription for: Flexeril 10 mg po BID prn back pain  4. [redacted] weeks gestation of pregnancy   - Discharge home  - Keep scheduled appt with [facility] Provider on 01/04/2024 - Patient verbalized an understanding of the plan of care and agrees.   Almond Army, CNM 12/25/2023, 1:42 AM

## 2023-12-25 NOTE — Discharge Instructions (Signed)
 You should purchase a MATERNITY SUPPORT BELT. You would feel much better wearing it day-to-day and especially during working hours.
